# Patient Record
Sex: Male | Born: 1945
Health system: Southern US, Community
[De-identification: ages and names within clinical notes are randomized; demographics above are authoritative.]

## PROBLEM LIST (undated history)

## (undated) DIAGNOSIS — Z872 Personal history of diseases of the skin and subcutaneous tissue: Secondary | ICD-10-CM

## (undated) DIAGNOSIS — I10 Essential (primary) hypertension: Secondary | ICD-10-CM

## (undated) DIAGNOSIS — E785 Hyperlipidemia, unspecified: Secondary | ICD-10-CM

## (undated) DIAGNOSIS — C61 Malignant neoplasm of prostate: Secondary | ICD-10-CM

## (undated) DIAGNOSIS — I5032 Chronic diastolic (congestive) heart failure: Secondary | ICD-10-CM

## (undated) DIAGNOSIS — F329 Major depressive disorder, single episode, unspecified: Secondary | ICD-10-CM

## (undated) DIAGNOSIS — H269 Unspecified cataract: Secondary | ICD-10-CM

## (undated) DIAGNOSIS — I509 Heart failure, unspecified: Secondary | ICD-10-CM

## (undated) DIAGNOSIS — R1310 Dysphagia, unspecified: Secondary | ICD-10-CM

## (undated) DIAGNOSIS — E039 Hypothyroidism, unspecified: Secondary | ICD-10-CM

## (undated) DIAGNOSIS — F1011 Alcohol abuse, in remission: Secondary | ICD-10-CM

## (undated) DIAGNOSIS — F32A Depression, unspecified: Secondary | ICD-10-CM

## (undated) DIAGNOSIS — K635 Polyp of colon: Secondary | ICD-10-CM

## (undated) DIAGNOSIS — K469 Unspecified abdominal hernia without obstruction or gangrene: Secondary | ICD-10-CM

## (undated) DIAGNOSIS — R269 Unspecified abnormalities of gait and mobility: Secondary | ICD-10-CM

## (undated) DIAGNOSIS — E079 Disorder of thyroid, unspecified: Secondary | ICD-10-CM

## (undated) DIAGNOSIS — D649 Anemia, unspecified: Secondary | ICD-10-CM

## (undated) DIAGNOSIS — A029 Salmonella infection, unspecified: Secondary | ICD-10-CM

## (undated) DIAGNOSIS — K219 Gastro-esophageal reflux disease without esophagitis: Secondary | ICD-10-CM

## (undated) DIAGNOSIS — M1990 Unspecified osteoarthritis, unspecified site: Secondary | ICD-10-CM

## (undated) DIAGNOSIS — I350 Nonrheumatic aortic (valve) stenosis: Secondary | ICD-10-CM

## (undated) HISTORY — PX: OTHER SURGICAL HISTORY: SHX169

## (undated) HISTORY — PX: HERNIA REPAIR: SHX51

## (undated) HISTORY — PX: ROOT CANAL: SHX2363

## (undated) HISTORY — DX: Depression, unspecified: F32.A

## (undated) HISTORY — DX: Unspecified abnormalities of gait and mobility: R26.9

## (undated) HISTORY — DX: Dysphagia, unspecified: R13.10

## (undated) HISTORY — DX: Malignant neoplasm of prostate: C61

## (undated) HISTORY — DX: Chronic diastolic (congestive) heart failure: I50.32

## (undated) HISTORY — DX: Major depressive disorder, single episode, unspecified: F32.9

## (undated) HISTORY — DX: Hyperlipidemia, unspecified: E78.5

## (undated) HISTORY — DX: Nonrheumatic aortic (valve) stenosis: I35.0

## (undated) HISTORY — DX: Unspecified osteoarthritis, unspecified site: M19.90

## (undated) HISTORY — DX: Polyp of colon: K63.5

## (undated) HISTORY — DX: Alcohol abuse, in remission: F10.11

## (undated) HISTORY — DX: Gastro-esophageal reflux disease without esophagitis: K21.9

## (undated) HISTORY — DX: Personal history of diseases of the skin and subcutaneous tissue: Z87.2

## (undated) HISTORY — DX: Essential (primary) hypertension: I10

## (undated) HISTORY — PX: PROSTATE BIOPSY: SHX241

## (undated) HISTORY — DX: Unspecified cataract: H26.9

## (undated) HISTORY — DX: Unspecified abdominal hernia without obstruction or gangrene: K46.9

---

## 1998-08-12 ENCOUNTER — Ambulatory Visit (HOSPITAL_BASED_OUTPATIENT_CLINIC_OR_DEPARTMENT_OTHER): Admission: RE | Admit: 1998-08-12 | Discharge: 1998-08-12 | Payer: Self-pay | Admitting: Orthopedic Surgery

## 2004-02-16 ENCOUNTER — Emergency Department (HOSPITAL_COMMUNITY): Admission: EM | Admit: 2004-02-16 | Discharge: 2004-02-16 | Payer: Self-pay | Admitting: Emergency Medicine

## 2007-11-08 ENCOUNTER — Emergency Department (HOSPITAL_COMMUNITY): Admission: EM | Admit: 2007-11-08 | Discharge: 2007-11-08 | Payer: Self-pay | Admitting: Emergency Medicine

## 2010-06-19 ENCOUNTER — Inpatient Hospital Stay (HOSPITAL_COMMUNITY): Admission: EM | Admit: 2010-06-19 | Discharge: 2010-06-25 | Payer: Self-pay | Source: Home / Self Care

## 2010-06-19 ENCOUNTER — Encounter (INDEPENDENT_AMBULATORY_CARE_PROVIDER_SITE_OTHER): Payer: Self-pay | Admitting: Internal Medicine

## 2010-06-21 ENCOUNTER — Encounter: Payer: Self-pay | Admitting: Cardiology

## 2010-06-22 ENCOUNTER — Encounter (INDEPENDENT_AMBULATORY_CARE_PROVIDER_SITE_OTHER): Payer: Self-pay | Admitting: Internal Medicine

## 2010-07-12 HISTORY — PX: APPENDECTOMY: SHX54

## 2010-07-22 DIAGNOSIS — I10 Essential (primary) hypertension: Secondary | ICD-10-CM | POA: Insufficient documentation

## 2010-07-22 DIAGNOSIS — I872 Venous insufficiency (chronic) (peripheral): Secondary | ICD-10-CM | POA: Insufficient documentation

## 2010-07-22 DIAGNOSIS — E785 Hyperlipidemia, unspecified: Secondary | ICD-10-CM | POA: Insufficient documentation

## 2010-07-24 ENCOUNTER — Encounter: Payer: Self-pay | Admitting: Cardiology

## 2010-07-24 ENCOUNTER — Ambulatory Visit
Admission: RE | Admit: 2010-07-24 | Discharge: 2010-07-24 | Payer: Self-pay | Source: Home / Self Care | Attending: Cardiology | Admitting: Cardiology

## 2010-07-24 DIAGNOSIS — I5032 Chronic diastolic (congestive) heart failure: Secondary | ICD-10-CM | POA: Insufficient documentation

## 2010-07-24 DIAGNOSIS — F1010 Alcohol abuse, uncomplicated: Secondary | ICD-10-CM | POA: Insufficient documentation

## 2010-07-24 DIAGNOSIS — Z789 Other specified health status: Secondary | ICD-10-CM | POA: Insufficient documentation

## 2010-08-13 ENCOUNTER — Telehealth: Payer: Self-pay | Admitting: Cardiology

## 2010-08-13 NOTE — Assessment & Plan Note (Signed)
Summary: np6/self ref/weakness,low blood pressure/uhc/mj   Visit Type:  Initial Consult Primary Provider:  Pearson Grippe, MD  CC:  Diastolic .  History of Present Illness: The patient presents for evaluation of diastolic heart failure. He was hospitalized in December. He had lower extremity swelling. He also had essentially a failure to thrive and excessive alcohol use complicated by major depressive disorder. During his hospitalization he was noted to have a BNP greater than 1000. Chest CT done for other reasons demonstrated some mild pulmonary edema. He was treated with diuretics. He was hypertensive and managed for this. Echocardiogram suggested some diastolic dysfunction but no significant valvular abnormalities.  There was a trivial pericardial effusion. Stress perfusion imaging demonstrated no ischemia or infarct.  Since going home he is wife has been caring for him. They have been living apartment. She had been living with her parents taking care of them. He had been failing to thrive left to his own care. He has now moved in with her and her parents. She watches his volume and gives him diuretics only if he has increased swelling with some dyspnea. She notes actually that his blood pressure has been running quite low and currently all antihypertensives have been held. He's not having any PND or orthopnea. He has not been having any palpitations, presyncope or syncope. He's not having any chest pressure, neck or arm discomfort. Of note he is avoiding all alcohol and has a low-salt diet.  He is extremely sedentary for reasons that are not clear to me. He does have chronic dyspnea with exertion. He apparently has some gait disturbance and diffuse muscle weakness with an apparent diagnosis of spinal stenosis.   Current Medications (verified): 1)  Lisinopril 20 Mg Tabs (Lisinopril) .... Hold 2)  Lexapro 10 Mg Tabs (Escitalopram Oxalate) .... 1/2 By Mouth At Bedtime 3)  Omeprazole 40 Mg Cpdr  (Omeprazole) .Marland Kitchen.. 1 By Mouth Daily 4)  Metoprolol Tartrate 50 Mg Tabs (Metoprolol Tartrate) .... Hold 5)  Spironolactone 25 Mg Tabs (Spironolactone) .... Hold 6)  Furosemide 40 Mg Tabs (Furosemide) .... Hold  Allergies (verified): No Known Drug Allergies  Past History:  Past Medical History: Diastolic heart failure, Alcohol dependence Depressive disorder Hypertension Hyperlipidemia     Past Surgical History: None     Family History:  Father died from complications of a heart attack at age 29.  His father was an alcoholic.     Social History: The patient denies any history of tobacco use.  Drinks about 12 cans of beer per day for unspecified duration of time.  He is currently retired and lives on [address].   Review of Systems       As stated in the HPI and negative for all other systems.   Vital Signs:  Patient profile:   65 year old male Height:      69 inches Weight:      175 pounds BMI:     25.94 Pulse rate:   70 / minute Resp:     16 per minute BP sitting:   130 / 81  (left arm)  Vitals Entered By: Marrion Coy, CNA (July 24, 2010 2:43 PM)  Physical Exam  General:  unkept.   Head:  normocephalic and atraumatic Eyes:  PERRLA/EOM intact; conjunctiva and lids normal. Mouth:  Teeth, gums and palate normal. Oral mucosa normal. Neck:  Neck supple, no JVD. No masses, thyromegaly or abnormal cervical nodes. Chest Wall:  no deformities or breast masses noted Lungs:  Clear  bilaterally to auscultation and percussion. Abdomen:  Bowel sounds positive; abdomen soft and non-tender without masses, organomegaly, or hernias noted. No hepatosplenomegaly, large umbilical hernia Msk:  Diffuse muscle wasting Extremities:  Mild bilateral lower extremity edema with his left leg wrapped Neurologic:  Alert and oriented x 3. Skin:  Intact without lesions or rashes. Cervical Nodes:  no significant adenopathy Axillary Nodes:  no significant adenopathy Inguinal Nodes:  no  significant adenopathy Psych:  Normal affect.   Detailed Cardiovascular Exam  Neck    Carotids: Carotids full and equal bilaterally without bruits.      Neck Veins: Normal, no JVD.    Heart    Inspection: no deformities or lifts noted.      Palpation: normal PMI with no thrills palpable.      Auscultation: regular rate and rhythm, S1, S2 without murmurs, rubs, gallops, or clicks.    Vascular    Abdominal Aorta: no palpable masses, pulsations, or audible bruits.      Femoral Pulses: normal femoral pulses bilaterally.      Pedal Pulses: normal pedal pulses bilaterally.      Radial Pulses: normal radial pulses bilaterally.      Peripheral Circulation: no clubbing, cyanosis, with normal capillary refill.     EKG  Procedure date:  06/21/2010  Findings:      Sinus rhythm, rate 67, axis within normal limits, intervals within normal limits, no acute ST-T wave changes  Impression & Recommendations:  Problem # 1:  ACUTE DIASTOLIC HEART FAILURE (ICD-428.31) He seemed to be euvolemic at this point and his wife is taking extraordinary care of him. We discussed this diagnosis at length and I think they can continue with salt and fluid restriction and p.r.n. Lasix dosing using a lower dose 20 mg.  Problem # 2:  HYPERTENSION (ICD-401.9) I would agree with holding all scheduled antihypertensives as he is having some problems with hypotension. His wife will keep a blood pressure diary and we can reinstitute probably lisinopril at a low dose if his blood pressure starts creeping up.  Problem # 3:  ALCOHOL ABUSE (ICD-305.00) It sounds like this was very mild contributing to his overall failure to thrive. I reemphasized the need for continued complete abstinence.  Patient Instructions: 1)  Your physician recommends that you schedule a follow-up appointment as needed 2)  Your physician recommends that you continue on your current medications as directed. Please refer to the Current Medication  list given to you today.

## 2010-08-19 NOTE — Progress Notes (Signed)
Summary: pt needs surg clearence for hernia surgery  Phone Note Call from Patient   Caller: Patient Summary of Call: pt needs ok to have hernia surgery/needs to ok with [facility] [phone] pls call asap pt ready to schedule asap Initial call taken by: Glynda Jaeger,  August 13, 2010 4:47 PM  Follow-up for Phone Call        CINDY FROM [facility] OFFICE CALLED AND THEY STILL HAVE NOT GOTTEN THE CLEARENCE PLEASE SEND ASAP FAX# [phone] Omer Jack  August 14, 2010 9:22 AM   Additional Follow-up for Phone Call Additional follow up Details #1::        Patient is at acceptable risk for the planned surgery.  He has diastolic heart failure and he will need to have is volume watched closely. Additional Follow-up by: Rollene Rotunda, MD, [facility],  August 14, 2010 10:23 AM    Additional Follow-up for Phone Call Additional follow up Details #2::    information faxed to the above number Follow-up by: Charolotte Capuchin, RN,  August 14, 2010 10:27 AM

## 2010-08-19 NOTE — Letter (Signed)
Summary: Patient's Vitals  Patient's Vitals   Imported By: Marylou Mccoy 08/13/2010 13:51:33  _____________________________________________________________________  External Attachment:    Type:   Image     Comment:   External Document

## 2010-09-21 LAB — BASIC METABOLIC PANEL WITH GFR
BUN: 14 mg/dL (ref 6–23)
CO2: 24 meq/L (ref 19–32)
Calcium: 9.2 mg/dL (ref 8.4–10.5)
Chloride: 101 meq/L (ref 96–112)
Creatinine, Ser: 0.94 mg/dL (ref 0.4–1.5)
GFR calc non Af Amer: >60 mL/min
Glucose, Bld: 104 mg/dL — ABNORMAL HIGH (ref 70–99)
Potassium: 4 meq/L (ref 3.5–5.1)
Sodium: 135 meq/L (ref 135–145)

## 2010-09-22 LAB — BASIC METABOLIC PANEL
BUN: 10 mg/dL (ref 6–23)
BUN: 8 mg/dL (ref 6–23)
CO2: 24 mEq/L (ref 19–32)
CO2: 27 mEq/L (ref 19–32)
CO2: 28 mEq/L (ref 19–32)
CO2: 28 mEq/L (ref 19–32)
Calcium: 8.6 mg/dL (ref 8.4–10.5)
Calcium: 8.6 mg/dL (ref 8.4–10.5)
Calcium: 8.7 mg/dL (ref 8.4–10.5)
Calcium: 9.4 mg/dL (ref 8.4–10.5)
Chloride: 96 mEq/L (ref 96–112)
Chloride: 99 mEq/L (ref 96–112)
Creatinine, Ser: 0.9 mg/dL (ref 0.4–1.5)
Creatinine, Ser: 1.01 mg/dL (ref 0.4–1.5)
Creatinine, Ser: 1.01 mg/dL (ref 0.4–1.5)
Creatinine, Ser: 1.18 mg/dL (ref 0.4–1.5)
GFR calc Af Amer: >60 mL/min (ref >60)
GFR calc non Af Amer: >60 mL/min (ref >60)
GFR calc non Af Amer: >60 mL/min (ref >60)
Glucose, Bld: 101 mg/dL — ABNORMAL HIGH (ref 70–99)
Glucose, Bld: 85 mg/dL (ref 70–99)
Glucose, Bld: 88 mg/dL (ref 70–99)
Glucose, Bld: 93 mg/dL (ref 70–99)
Glucose, Bld: 95 mg/dL (ref 70–99)
Potassium: 3.5 mEq/L (ref 3.5–5.1)
Sodium: 131 mEq/L — ABNORMAL LOW (ref 135–145)
Sodium: 136 mEq/L (ref 135–145)

## 2010-09-22 LAB — DIFFERENTIAL
Basophils Relative: 2 % — ABNORMAL HIGH (ref 0–1)
Basophils Relative: 3 % — ABNORMAL HIGH (ref 0–1)
Eosinophils Absolute: 1.1 10*3/uL — ABNORMAL HIGH (ref 0.0–0.7)
Eosinophils Relative: 12 % — ABNORMAL HIGH (ref 0–5)
Lymphs Abs: 2.1 10*3/uL (ref 0.7–4.0)
Monocytes Absolute: 0.8 10*3/uL (ref 0.1–1.0)
Monocytes Relative: 10 % (ref 3–12)
Monocytes Relative: 9 % (ref 3–12)
Neutro Abs: 3.3 10*3/uL (ref 1.7–7.7)
Neutrophils Relative %: 45 % (ref 43–77)
Neutrophils Relative %: 53 % (ref 43–77)

## 2010-09-22 LAB — LIPID PANEL
Cholesterol: 153 mg/dL (ref 0–200)
LDL Cholesterol: 107 mg/dL — ABNORMAL HIGH (ref 0–99)
Total CHOL/HDL Ratio: 6.1 RATIO
Triglycerides: 107 mg/dL (ref <150)
VLDL: 21 mg/dL (ref 0–40)

## 2010-09-22 LAB — MAGNESIUM: Magnesium: 2.3 mg/dL (ref 1.5–2.5)

## 2010-09-22 LAB — COMPREHENSIVE METABOLIC PANEL
BUN: 9 mg/dL (ref 6–23)
CO2: 27 mEq/L (ref 19–32)
Calcium: 8.8 mg/dL (ref 8.4–10.5)
Creatinine, Ser: 1.01 mg/dL (ref 0.4–1.5)
GFR calc non Af Amer: >60 mL/min (ref >60)
Glucose, Bld: 93 mg/dL (ref 70–99)
Total Bilirubin: 1 mg/dL (ref 0.3–1.2)

## 2010-09-22 LAB — CBC
HCT: 41.6 % (ref 39.0–52.0)
HCT: 43.1 % (ref 39.0–52.0)
Hemoglobin: 13.8 g/dL (ref 13.0–17.0)
Hemoglobin: 14.3 g/dL (ref 13.0–17.0)
Hemoglobin: 15.4 g/dL (ref 13.0–17.0)
MCH: 35 pg — ABNORMAL HIGH (ref 26.0–34.0)
MCH: 35.1 pg — ABNORMAL HIGH (ref 26.0–34.0)
MCH: 35.2 pg — ABNORMAL HIGH (ref 26.0–34.0)
MCHC: 34.6 g/dL (ref 30.0–36.0)
MCHC: 34.8 g/dL (ref 30.0–36.0)
MCHC: 35.7 g/dL (ref 30.0–36.0)
MCV: 101 fL — ABNORMAL HIGH (ref 78.0–100.0)
MCV: 98.4 fL (ref 78.0–100.0)
Platelets: 220 10*3/uL (ref 150–400)
Platelets: 234 10*3/uL (ref 150–400)
RBC: 3.97 MIL/uL — ABNORMAL LOW (ref 4.22–5.81)
RDW: 13.3 % (ref 11.5–15.5)
RDW: 13.4 % (ref 11.5–15.5)
WBC: 11.8 10*3/uL — ABNORMAL HIGH (ref 4.0–10.5)

## 2010-09-22 LAB — HEPATITIS B CORE ANTIBODY, TOTAL: Hep B Core Total Ab: NEGATIVE

## 2010-09-22 LAB — BRAIN NATRIURETIC PEPTIDE: Pro B Natriuretic peptide (BNP): 895 pg/mL — ABNORMAL HIGH (ref 0.0–100.0)

## 2010-09-22 LAB — HEPATITIS B SURFACE ANTIBODY,QUALITATIVE: Hep B S Ab: NEGATIVE

## 2010-09-22 LAB — HEPATITIS B SURFACE ANTIGEN: Hepatitis B Surface Ag: NEGATIVE

## 2010-09-23 ENCOUNTER — Ambulatory Visit (HOSPITAL_COMMUNITY)
Admission: RE | Admit: 2010-09-23 | Discharge: 2010-09-23 | Disposition: A | Discharge status: Home / Self Care | Payer: Medicare Other | Source: Ambulatory Visit | Attending: General Surgery | Admitting: General Surgery

## 2010-09-23 ENCOUNTER — Encounter (HOSPITAL_COMMUNITY)
Admission: RE | Admit: 2010-09-23 | Discharge: 2010-09-23 | Disposition: A | Discharge status: Home / Self Care | Payer: Medicare Other | Source: Ambulatory Visit | Attending: General Surgery | Admitting: General Surgery

## 2010-09-23 ENCOUNTER — Other Ambulatory Visit (HOSPITAL_COMMUNITY): Payer: Self-pay | Admitting: General Surgery

## 2010-09-23 DIAGNOSIS — K469 Unspecified abdominal hernia without obstruction or gangrene: Secondary | ICD-10-CM

## 2010-09-23 DIAGNOSIS — Z01818 Encounter for other preprocedural examination: Secondary | ICD-10-CM | POA: Insufficient documentation

## 2010-09-23 DIAGNOSIS — Z01812 Encounter for preprocedural laboratory examination: Secondary | ICD-10-CM | POA: Insufficient documentation

## 2010-09-23 LAB — DIFFERENTIAL
Eosinophils Absolute: 0.2 10*3/uL (ref 0.0–0.7)
Lymphs Abs: 2.4 10*3/uL (ref 0.7–4.0)
Monocytes Absolute: 0.8 10*3/uL (ref 0.1–1.0)
Monocytes Relative: 9 % (ref 3–12)
Neutro Abs: 6.1 10*3/uL (ref 1.7–7.7)
Neutrophils Relative %: 63 % (ref 43–77)

## 2010-09-23 LAB — COMPREHENSIVE METABOLIC PANEL
ALT: 17 U/L (ref 0–53)
AST: 25 U/L (ref 0–37)
Albumin: 4.5 g/dL (ref 3.5–5.2)
Alkaline Phosphatase: 64 U/L (ref 39–117)
CO2: 29 mEq/L (ref 19–32)
Chloride: 99 mEq/L (ref 96–112)
GFR calc Af Amer: >60 mL/min (ref >60)
Potassium: 4.2 mEq/L (ref 3.5–5.1)
Sodium: 133 mEq/L — ABNORMAL LOW (ref 135–145)
Total Bilirubin: 1.3 mg/dL — ABNORMAL HIGH (ref 0.3–1.2)

## 2010-09-23 LAB — CBC
Hemoglobin: 14.1 g/dL (ref 13.0–17.0)
MCH: 34.6 pg — ABNORMAL HIGH (ref 26.0–34.0)
MCHC: 35.3 g/dL (ref 30.0–36.0)
MCV: 98 fL (ref 78.0–100.0)
RBC: 4.07 MIL/uL — ABNORMAL LOW (ref 4.22–5.81)

## 2010-09-23 LAB — URINALYSIS, ROUTINE W REFLEX MICROSCOPIC
Bilirubin Urine: NEGATIVE
Glucose, UA: NEGATIVE mg/dL
Ketones, ur: NEGATIVE mg/dL
Protein, ur: NEGATIVE mg/dL
pH: 7.5 (ref 5.0–8.0)

## 2010-09-23 LAB — APTT: aPTT: 32 seconds (ref 24–37)

## 2010-09-25 LAB — NO BLOOD PRODUCTS

## 2010-09-29 ENCOUNTER — Observation Stay (HOSPITAL_COMMUNITY)
Admission: RE | Admit: 2010-09-29 | Discharge: 2010-09-30 | Disposition: A | Discharge status: Home / Self Care | Payer: Medicare Other | Source: Ambulatory Visit | Attending: General Surgery | Admitting: General Surgery

## 2010-09-29 ENCOUNTER — Other Ambulatory Visit: Payer: Self-pay | Admitting: General Surgery

## 2010-09-29 DIAGNOSIS — K4090 Unilateral inguinal hernia, without obstruction or gangrene, not specified as recurrent: Secondary | ICD-10-CM | POA: Insufficient documentation

## 2010-09-29 DIAGNOSIS — F329 Major depressive disorder, single episode, unspecified: Secondary | ICD-10-CM | POA: Insufficient documentation

## 2010-09-29 DIAGNOSIS — I10 Essential (primary) hypertension: Secondary | ICD-10-CM | POA: Insufficient documentation

## 2010-09-29 DIAGNOSIS — K429 Umbilical hernia without obstruction or gangrene: Principal | ICD-10-CM | POA: Insufficient documentation

## 2010-09-29 DIAGNOSIS — F3289 Other specified depressive episodes: Secondary | ICD-10-CM | POA: Insufficient documentation

## 2010-09-29 DIAGNOSIS — I5030 Unspecified diastolic (congestive) heart failure: Secondary | ICD-10-CM | POA: Insufficient documentation

## 2010-09-29 DIAGNOSIS — F1020 Alcohol dependence, uncomplicated: Secondary | ICD-10-CM | POA: Insufficient documentation

## 2010-09-29 DIAGNOSIS — I509 Heart failure, unspecified: Secondary | ICD-10-CM | POA: Insufficient documentation

## 2010-09-29 DIAGNOSIS — Z01818 Encounter for other preprocedural examination: Secondary | ICD-10-CM | POA: Insufficient documentation

## 2010-09-29 DIAGNOSIS — Z01812 Encounter for preprocedural laboratory examination: Secondary | ICD-10-CM | POA: Insufficient documentation

## 2010-09-30 NOTE — Op Note (Signed)
NAME:  Scott Newman, Scott Newman              ACCOUNT NO.:  [id number]  MEDICAL RECORD NO.:  [id number]           PATIENT TYPE:  O  LOCATION:  5151                         FACILITY:  [facility]  PHYSICIAN:  Angelia Mould. Derrell Lolling, M.D.DATE OF BIRTH:  17-Aug-1945  DATE OF PROCEDURE:  09/29/2010 DATE OF DISCHARGE:                              OPERATIVE REPORT   PREOPERATIVE DIAGNOSES: 1. Umbilical hernia. 2. Right inguinal hernia.  POSTOPERATIVE DIAGNOSES: 1. Umbilical hernia. 2. Right inguinal hernia.  OPERATION PERFORMED: 1. Repair umbilical hernia with mesh. 2. Debridement of skin and subcutaneous tissue of periumbilical area. 3. Repair right inguinal hernia with mesh Armanda Heritage repair).  SURGEON:  Angelia Mould. Derrell Lolling, MD  OPERATIVE INDICATIONS:  This is a 65 year old Caucasian man with diastolic heart failure, history of alcohol dependence, depressive disorder, hypertension.  He has had an umbilical hernia and right inguinal hernia for many years and they have been getting larger.  He now has some discomfort but no severe pain and no history of incarceration.  He wants to have these repaired.  On exam, he has a very large umbilical hernia with a hernia sac about 8 cm in size and a defect about 4 cm in size, this is reducible.  The skin has been stretched out a great deal.  He also has a large right inguinal hernia that partially extends into the scrotum and it is reducible when he is supine.  He was brought to the operating room electively.  OPERATIVE TECHNIQUE:  Following the induction of general endotracheal anesthesia, the patient's abdomen and groins and genitalia were prepped and draped in a sterile fashion.  Intravenous antibiotics were given. Surgical time-out was held, identifying correct patient, correct procedure, and correct sites.  A 0.25% Marcaine with epinephrine was used as a local infiltration anesthetic.  We started with the umbilical hernia.  I made a transverse  elliptical incision and completely excised the excessively redundant skin and subcutaneous tissue.  I then debrided the hernia sac.  I then could see the edges of the fascial defect, which were about 4 cm in size or less. We had clear visualization of the abdominal cavity.  The small bowel looked normal.  There was no ascites.  I felt all around underneath the fascia and there were no other defects.  I brought a 6.5 cm diameter circular piece of PROCEED ventral patch, which is a composite mesh.  The tags were cut off.  I positioned the mesh so that the visceral side was down.  I sutured this in place with several interrupted mattress sutures of #1 Novofil, going back about 3 cm in all direction.  Once this all these sutures were placed, I then rolled up the mesh and inserted in the abdomen and then pulled all the sutures up and the mesh deployed quite nicely.  I tied all the sutures. I felt around underneath it did not appear to be any major defects and certainly no intestine or omentum caught in the mesh.  I then closed the fascia transversely with about 8 interrupted sutures of #1 Novofil.  The wound was irrigated with saline.  Subcutaneous tissue was  closed with interrupted sutures of 3-0 Vicryl.  The skin was closed with a running subcuticular suture of 4-0 Monocryl and Dermabond.  I then went down to repair the right inguinal hernia and made a slightly oblique transverse incision in the right inguinal area.  Dissection was carried down through the subcutaneous tissue exposing the aponeurosis of the external oblique.  The external oblique was incised in the direction of its fibers and opened up the external inguinal ring.  The external oblique was dissected away from the underlying tissues and self- retaining retractors were placed.  I very carefully cleaned off the inguinal ligament inferiorly.  I encircled the cord structures with a Penrose drain.  I dissected a very large complex  sliding hernia away from the cord structures.  This actually seemed to fill the direct and the indirect spaces as one hernia.  Because this was a large sliding hernia, I simply reduced it and oversewed and imbricated the tissues on top with a running suture of 2-0 Vicryl.  This held the hernia reduced during the repair.  I repaired the hernia with a 3 inch x 6 inch piece of UltraPro mesh.  This was trimmed at the corners.  The mesh was sutured in place with running sutures of 2-0 Prolene and interrupted mattress sutures of 2-0 Prolene.  Running suture of 2-0 Prolene was placed along the inguinal ligament, hold the mesh in place starting at the pubic tubercle and going about 2 inches lateral to the internal ring.  This was then tied down.  Medially and superiorly and superior medially, several interrupted mattress sutures of 2-0 Prolene were placed.  The mesh was incised laterally so as to wrap around the cord structures of the internal ring.  The tails of the mesh was overlapped laterally and several other sutures were placed.  Hold the mesh in place and the hold the mesh to itself.  This provided a very secure repair of both medial and lateral to the internal ring, but allowed a fingertip opening for the cord structures.  There was no bleeding.  The wound was irrigated with saline.  The external oblique was closed with running suture of 2-0 Vicryl.  The Scarpa fascia was closed with 3-0 Vicryl sutures and skin closed with running subcuticular suture of 4-0 Monocryl and Dermabond.  Ice packs were placed and the patient was taken to the recovery room in stable condition.  Estimated blood loss was about 25 mL or less.  Complications none.  Sponge, needles and instruments counts were correct.     Angelia Mould. Derrell Lolling, M.D.     HMI/MEDQ  D:  09/29/2010  T:  09/30/2010  Job:  [id number]  cc:   Massie Maroon, MD Rollene Rotunda, MD, [facility]  Electronically Signed by Claud Kelp M.D. on  09/30/2010 06:20:29 AM

## 2010-10-09 NOTE — Discharge Summary (Signed)
  NAME:  Scott Newman, Scott Newman              ACCOUNT NO.:  [id number]  MEDICAL RECORD NO.:  [id number]           PATIENT TYPE:  O  LOCATION:  5151                         FACILITY:  [facility]  PHYSICIAN:  Angelia Mould. Derrell Lolling, M.D.DATE OF BIRTH:  [birth year]  DATE OF ADMISSION:  09/29/2010 DATE OF DISCHARGE:  09/30/2010                              DISCHARGE SUMMARY   FINAL DIAGNOSES: 1. Umbilical hernia. 2. Right inguinal hernia. 3. Diastolic heart failure, stable. 4. History of alcohol dependence, no alcohol intake since June 19, 2010. 5. Depressive disorder, not otherwise specified. 6. Hypertension. 7. Past history of lower extremity cellulitis with venous     insufficiency. 8. History of abnormal liver function test with negative hepatitis     serology.  OPERATIONS PERFORMED: 1. Repair of umbilical hernia with mesh. 2. Repair of right inguinal hernia with mesh.  HISTORY:  This is a 65 year old Caucasian man with multiple medical problems as listed above.  He has had an umbilical hernia and right inguinal hernia for many years they have been getting larger.  He now has some discomfort but no history of incarceration of these hernias. On exam, he has a very large umbilical hernia with the hernia sac about 8 cm in size at least and a defect about 4 cm in size.  This is reducible.  The skin has been stretched out a great deal.  He also has a large right inguinal hernia that partially extended into the scrotum and is reducible with supine.  He was brought to operating room electively following medical evaluation by Dr. Selena Batten and Dr. Antoine Poche.  HOSPITAL COURSE:  On the day of admission, the patient was brought to operating room and under general anesthesia we repaired the umbilical hernia and the right inguinal hernia with mesh.  Because of the excessive redundant skin around the umbilicus, we did simply resect that skin and closed the skin transversely.  The surgery was  uneventful. Because of the patient's medical problems and age, we elected to observe him overnight.  He had no problems following morning, he was tolerating a liquid diet, voiding well and was getting out of bed, all the wounds looked fine without any bleeding.  He was discharged home.  DISCHARGE MEDICATIONS: 1. Vicodin for pain. 2. Vitamin B drops. 3. Vitamin C 1 tablet daily. 4. Osteo Bi-Flex daily. 5. Lexapro 10 mg daily.  He was given instruction in diet and activities and a followup appointment with me in about 3 weeks.     Angelia Mould. Derrell Lolling, M.D.     HMI/MEDQ  D:  10/08/2010  T:  10/09/2010  Job:  [id number]  cc:   Massie Maroon, MD Rollene Rotunda, MD, [facility]  Electronically Signed by Claud Kelp M.D. on 10/09/2010 08:53:27 AM

## 2010-11-18 ENCOUNTER — Encounter (INDEPENDENT_AMBULATORY_CARE_PROVIDER_SITE_OTHER): Payer: Self-pay | Admitting: General Surgery

## 2011-01-18 ENCOUNTER — Other Ambulatory Visit: Payer: Self-pay | Admitting: Gastroenterology

## 2011-02-15 ENCOUNTER — Encounter (INDEPENDENT_AMBULATORY_CARE_PROVIDER_SITE_OTHER): Payer: Self-pay | Admitting: General Surgery

## 2011-02-15 ENCOUNTER — Ambulatory Visit (INDEPENDENT_AMBULATORY_CARE_PROVIDER_SITE_OTHER): Payer: Medicare Other | Admitting: General Surgery

## 2011-02-15 VITALS — BP 156/86 | HR 64 | Temp 96.6°F | Ht 69.0 in | Wt 200.4 lb

## 2011-02-15 DIAGNOSIS — D126 Benign neoplasm of colon, unspecified: Secondary | ICD-10-CM

## 2011-02-15 NOTE — Progress Notes (Signed)
Subjective:     Patient ID: Scott Newman, male   DOB: 1946/07/10, 65 y.o.   MRN: [id number]  HPI Scott Newman has just recovered from repair of a large incarcerated umbilical hernia with mesh and a right inguinal hernia repair with mesh. The surgery was  done back in March of 2012.  He is now sent back to me by Scott Newman for consideration of a right colectomy because of a large adenomatous polyp of the cecum. Scott Newman is his cardiologist and Scott Newman is his primary care physician.  The patient went for a screening colonoscopy  and this was done on January 18, 2011. 2 sessile polyps were found in the distal transverse colon which were removed. A sessile polyp was in  the descending colon which was removed. A large carpet-like polyp was found in the cecum which was 30 mm in size. Biopsies of all of the polyps showed tubular adenoma, no dysplasia. He was referred for consideration of resection of a large polyp in the cecum.  Other than constipation, he does not have any colorectal symptoms.  Significant comorbidities include stable diastolic heart failure, history of alcohol dependence, no alcohol since December 2011, depressive order, hypertension, or tree she lives in his insufficiency in the past, history of abnormal liver function test was negative serologyReview of Systems  Constitutional: Negative.   HENT: Negative.   Eyes: Negative.   Respiratory: Negative.   Cardiovascular: Negative.   Gastrointestinal: Negative.   Genitourinary: Negative.   Musculoskeletal: Negative.   Neurological: Negative.   Hematological: Negative.   Psychiatric/Behavioral: Negative.     Past Medical History  Diagnosis Date  . Cellulitis   . Arthritis   . Hernia     umbilical and inguinal  . Heart disease     heart weakness  . Colon polyp    Current Outpatient Prescriptions  Medication Sig Dispense Refill  . aspirin 325 MG tablet Take 325 mg by mouth as needed.        Marland Kitchen escitalopram  (LEXAPRO) 5 MG tablet Take 5 mg by mouth daily.        . Folic Acid 200 MCG TABS Take by mouth daily.        . Furosemide (LASIX PO) Take by mouth as needed.        Marland Kitchen SPIRONOLACTONE PO Take by mouth as needed.         No Known Allergies  Family History  Problem Relation Age of Onset  . Heart disease Father     History  Substance Use Topics  . Smoking status: Never Smoker   . Smokeless tobacco: Not on file  . Alcohol Use: No     RECOVERING ALCOHOLIC        Objective:   Physical Exam  Constitutional: He is oriented to person, place, and time. He appears well-developed and well-nourished. No distress.  HENT:  Head: Normocephalic and atraumatic.  Nose: Nose normal.  Mouth/Throat: No oropharyngeal exudate.  Eyes: Conjunctivae are normal. Right eye exhibits no discharge. Scleral icterus is present.  Neck: Normal range of motion. Neck supple. No JVD present. No tracheal deviation present. No thyromegaly present.  Cardiovascular: Normal rate, regular rhythm and intact distal pulses.   Murmur heard. Pulmonary/Chest: Breath sounds normal. No respiratory distress. He has no wheezes. He has no rales. He exhibits no tenderness.  Abdominal: Bowel sounds are normal. He exhibits no distension and no mass. There is no tenderness. There is no rebound and  no guarding.    Lymphadenopathy:    He has no cervical adenopathy.  Neurological: He is alert and oriented to person, place, and time. Coordination normal.  Skin: Skin is dry. No rash noted. No erythema. No pallor.  Psychiatric: He has a normal mood and affect. Judgment and thought content normal.       Assessment:     Tubular adenoma of cecum, 3 cm diameter.  Status post open repair incarcerated umbilical hernia with mesh.  Status post open repair right inguinal hernia with mesh.  Patient is a Scientist, product/process development.  History diastolic heart failure, stable  History alcohol dependence.  Depressive  disorder.  Hypertension.  History abnormal liver function test with negative hepatitis rales.  Past history lower extremity cellulitis and venous insufficiency.    Plan:     Scheduled for laparoscopic assisted right colectomy, possible open.  I've discussed indication and details of the surgery with the patient and his wife. Risk and complications have been outlined, including but not limited to bleeding, infection, hernia, mesh infection, conversion to open laparotomy, injury to adjacent organs such as the intestine major vascular structures or ureter with major reconstructive surgery, cardiac pulmonary and thromboembolic problems. Risk of colostomy is low but not impossible. He understands these issues well. At this time all his questions were answered. He is agreeable with this plan.  Discontinue aspirin 7 days preop.  We will ask for cardiac clearance with Scott Newman.

## 2011-02-15 NOTE — Patient Instructions (Signed)
You have a large polyp in your cecum, which is the first part of the colon on the right side. The biopsies do not show cancer, but this is premalignant and can become cancer in the future. We have discussed this in detail, and we are going to schedule you for a laparoscopic assisted right colectomy, although this may have to be converted to an open operation. You will undergo a bowel prep at home. You must discontinue your aspirin 7 days prior to surgery. We will ask Dr. Rollene Rotunda for cardiac evaluation and clearance  preop. We will do the surgery at [facility].

## 2011-02-19 ENCOUNTER — Telehealth: Payer: Self-pay | Admitting: Cardiology

## 2011-02-19 NOTE — Telephone Encounter (Signed)
Pam--i have tried to call [facility] to get pre op clearance for mr Such, but had to leave message with surgical scheduler to fax me referral form as i'm having trouble getting thru to them--i've asked them to fax attent Gala Padovano, so if you don't get one by next Monday, please call them again--pt needing bowel surgery--thanks nt

## 2011-02-19 NOTE — Telephone Encounter (Signed)
Arline Asp RN is calling for surgical clearance. Pt needs cardiac clearance for colon surgery.

## 2011-02-19 NOTE — Telephone Encounter (Signed)
Called [facility] and asked them to refax surgical clearance form to me so we may get mr Engebretson cleared for bowel surg.--nt

## 2011-02-23 NOTE — Telephone Encounter (Signed)
Per Dr Antoine Poche pt needs to be seen before surgical clearance can be given.  Send staff message to G. Davis to schedule the appointment for the patient.

## 2011-03-05 ENCOUNTER — Telehealth: Payer: Self-pay | Admitting: Cardiology

## 2011-03-05 NOTE — Telephone Encounter (Signed)
Pt needs sur clearance for colon surgery. Cindy need to talk to [facility] re pt meds.

## 2011-03-05 NOTE — Telephone Encounter (Signed)
Office aware pt has appt with Lorin Picket 03/18/11 for surgical clearance. Mylo Red RN

## 2011-03-18 ENCOUNTER — Ambulatory Visit (INDEPENDENT_AMBULATORY_CARE_PROVIDER_SITE_OTHER): Payer: Medicare Other | Admitting: Physician Assistant

## 2011-03-18 ENCOUNTER — Encounter: Payer: Self-pay | Admitting: Physician Assistant

## 2011-03-18 VITALS — BP 163/87 | HR 68 | Ht 69.0 in | Wt 207.0 lb

## 2011-03-18 DIAGNOSIS — I10 Essential (primary) hypertension: Secondary | ICD-10-CM

## 2011-03-18 DIAGNOSIS — K635 Polyp of colon: Secondary | ICD-10-CM | POA: Insufficient documentation

## 2011-03-18 DIAGNOSIS — I509 Heart failure, unspecified: Secondary | ICD-10-CM

## 2011-03-18 DIAGNOSIS — D126 Benign neoplasm of colon, unspecified: Secondary | ICD-10-CM

## 2011-03-18 DIAGNOSIS — I5032 Chronic diastolic (congestive) heart failure: Secondary | ICD-10-CM

## 2011-03-18 DIAGNOSIS — I519 Heart disease, unspecified: Secondary | ICD-10-CM

## 2011-03-18 DIAGNOSIS — Z01810 Encounter for preprocedural cardiovascular examination: Secondary | ICD-10-CM

## 2011-03-18 MED ORDER — LISINOPRIL 20 MG PO TABS: 20.0000 mg | ORAL_TABLET | Freq: Every day | ORAL | Status: DC

## 2011-03-18 NOTE — Assessment & Plan Note (Signed)
I will start him on Lisinopril 20 mg QD.  Repeat a BP check with the RN in one week along with a BMET.

## 2011-03-18 NOTE — Assessment & Plan Note (Signed)
Volume stable.  He has been advised to take Lasix prn weight gain.  Close attention will need to be paid to his fluid status in the peri-operative period to prevent volume overload.  Our service will be available in the peri-operative period as necessary.

## 2011-03-18 NOTE — Progress Notes (Signed)
History of Present Illness: Primary Cardiologist:  Dr. Rollene Rotunda  Scott Newman is a 65 y.o. male presents for surgical clearance.  He has a history of hypertension, hyperlipidemia and diastolic heart failure.  Last Myoview 12/11 (during admission for heart failure): No ischemia or scar, EF 63%.  Last echocardiogram 12/11: EF 55-60%, grade 1 diastolic dysfunction, mild aortic stenosis with a mean gradient of 6 mm of mercury, moderate mitral annular calcification, mild LAE, trivial pericardial effusion.  He was evaluated by Dr. Antoine Poche 1/12 after his hospitalization.  He was doing well in terms of fluid at that time.  Lasix was recommended at p.r.n. Dosing only.  He was off of most of his blood pressure medications due to hypotension.  His wife was paying close attention to his blood pressure for him.  Of note, he also has had a history of alcohol abuse.  He has a cecal adenoma that requires colon resection with Dr. Claud Kelp.  He has been referred for surgical clearance.  He is somewhat sedentary.  He reports a h/o muscle pain for years that limits his activity.  He walks with a single point cane.  He sometimes navigates steps.  He can do small projects around his home.  He denies any chest pain.  He denies significant dyspnea.  He denies orthopnea, PND or significant edema.  He has not had to take lasix for fluid gains.  He has been noncompliant with his diet and has gained 30 lbs since we last saw him.  He denies syncope or palpitations.    Past Medical History  Diagnosis Date  . History of cellulitis and abscess   . Arthritis   . Hernia     umbilical and inguinal  . Colon polyp   . Chronic diastolic heart failure     echocardiogram 12/11: EF 55-60%, grade 1 diastolic dysfunction, mild aortic stenosis with a mean gradient of 6 mm of mercury, moderate mitral annular calcification, mild LAE, trivial pericardial effusion;    Myoview 12/11 (during admission for heart failure): No  ischemia or scar, EF 63%.  . Aortic stenosis   . HTN (hypertension)   . Depression   . History of alcohol abuse     Current Outpatient Prescriptions  Medication Sig Dispense Refill  . aspirin 325 MG tablet Take 325 mg by mouth as needed.        Marland Kitchen escitalopram (LEXAPRO) 5 MG tablet Take 5 mg by mouth daily.        . Folic Acid 200 MCG TABS Take 2,000 mcg by mouth daily.       . furosemide (LASIX) 40 MG tablet Take 40 mg by mouth daily. Take as needed for weight gain of 3 pounds in one day or increased swelling and shortness of breath      . lisinopril (PRINIVIL,ZESTRIL) 20 MG tablet Take 1 tablet (20 mg total) by mouth daily.  30 tablet  6  . Multiple Vitamin (MULTI-VITAMIN PO) Take by mouth daily.          Allergies: No Known Allergies  Social Hx:  Non smoker  ROS:  Please see the history of present illness.  All other systems reviewed and negative.   Vital Signs: BP 163/87  Pulse 68  Ht 5\' 9"  (1.753 m)  Wt 207 lb (93.895 kg)  BMI 30.57 kg/m2 Repeat BP by me on left: 160/100  PHYSICAL EXAM: Well nourished, well developed, in no acute distress HEENT: normal Neck: no JVD Vascular: no  carotid bruits Cardiac:  normal S1, S2; RRR; 1-2/6 systolic murmur at RUSB Lungs:  clear to auscultation bilaterally, no wheezing, rhonchi or rales Abd: soft, nontender, no hepatomegaly Ext: trace bilateral edema Skin: warm and dry Neuro:  CNs 2-12 intact, no focal abnormalities noted Psych: flat affect  EKG:  Sinus rhythm, heart rate 66, normal axis, poor R-wave progression, nonspecific ST-T wave changes  ASSESSMENT AND PLAN:

## 2011-03-18 NOTE — Assessment & Plan Note (Addendum)
He needs colon resection.  He is currently not having any unstable cardiac conditions.  He had a low risk myoview in 12/11.  He has not had any recurrent chest pain or significant dyspnea.  He is probably able to achieve 4 METS.  His volume status is stable.  According to ACC/AHA guidelines, he requires no further cardiovascular workup prior to his noncardiac procedure.  He should be at acceptable risk.  He does need better BP control.  See below.

## 2011-03-18 NOTE — Patient Instructions (Addendum)
Weigh daily and call if:  Weight up 3 lbs in one day, increased swelling or increased dyspnea.   Your physician recommends that you schedule a follow-up appointment in: 3 months with Dr Antoine Poche    Your physician recommends that you schedule a follow-up appointment in: 1 week for BP check  Your physician has recommended you make the following change in your medication: START Lisinopril 20 mg daily  Your physician recommends that you return for lab work in: 1 week with BP check

## 2011-03-19 ENCOUNTER — Telehealth (INDEPENDENT_AMBULATORY_CARE_PROVIDER_SITE_OTHER): Payer: Self-pay

## 2011-03-19 NOTE — Telephone Encounter (Signed)
Cardiac clearance here via fax. Surgery orders and clearance to surgery schedulers 03-19-11

## 2011-03-29 ENCOUNTER — Other Ambulatory Visit (INDEPENDENT_AMBULATORY_CARE_PROVIDER_SITE_OTHER): Payer: Medicare Other | Admitting: *Deleted

## 2011-03-29 ENCOUNTER — Ambulatory Visit (INDEPENDENT_AMBULATORY_CARE_PROVIDER_SITE_OTHER): Payer: Medicare Other

## 2011-03-29 VITALS — BP 148/80 | HR 72 | Resp 18

## 2011-03-29 DIAGNOSIS — I5032 Chronic diastolic (congestive) heart failure: Secondary | ICD-10-CM

## 2011-03-29 DIAGNOSIS — I519 Heart disease, unspecified: Secondary | ICD-10-CM

## 2011-03-29 LAB — BASIC METABOLIC PANEL
BUN: 11 mg/dL (ref 6–23)
CO2: 25 mEq/L (ref 19–32)
Chloride: 99 mEq/L (ref 96–112)
Creatinine, Ser: 0.9 mg/dL (ref 0.4–1.5)
Glucose, Bld: 76 mg/dL (ref 70–99)

## 2011-03-29 NOTE — Progress Notes (Signed)
Per pt feeling fine.  Taking medications as RXed in the afternoon.  BP with his home machine 172/88  HR 69.

## 2011-04-06 LAB — DIFFERENTIAL
Basophils Absolute: 0
Basophils Relative: 1
Lymphocytes Relative: 34
Monocytes Absolute: 0.8
Monocytes Relative: 9
Neutro Abs: 4.2
Neutrophils Relative %: 49

## 2011-04-06 LAB — COMPREHENSIVE METABOLIC PANEL
Albumin: 4.3
BUN: 6
Creatinine, Ser: 0.67
Glucose, Bld: 112 — ABNORMAL HIGH
Total Bilirubin: 1.3 — ABNORMAL HIGH
Total Protein: 8.1

## 2011-04-06 LAB — CBC
HCT: 49.1
Hemoglobin: 15.9
MCV: 101.2 — ABNORMAL HIGH
Platelets: 222
RDW: 12.9

## 2011-04-30 ENCOUNTER — Encounter (HOSPITAL_COMMUNITY)
Admission: RE | Admit: 2011-04-30 | Discharge: 2011-04-30 | Disposition: A | Discharge status: Home / Self Care | Payer: Medicare Other | Source: Ambulatory Visit | Attending: General Surgery | Admitting: General Surgery

## 2011-04-30 LAB — CBC
HCT: 43.1 % (ref 39.0–52.0)
Hemoglobin: 15.9 g/dL (ref 13.0–17.0)
MCV: 94.1 fL (ref 78.0–100.0)
RBC: 4.58 MIL/uL (ref 4.22–5.81)
RDW: 13.9 % (ref 11.5–15.5)
WBC: 7.1 10*3/uL (ref 4.0–10.5)

## 2011-04-30 LAB — COMPREHENSIVE METABOLIC PANEL
ALT: 24 U/L (ref 0–53)
Alkaline Phosphatase: 65 U/L (ref 39–117)
BUN: 9 mg/dL (ref 6–23)
CO2: 27 mEq/L (ref 19–32)
Chloride: 94 mEq/L — ABNORMAL LOW (ref 96–112)
GFR calc Af Amer: >90 mL/min (ref >90)
Glucose, Bld: 118 mg/dL — ABNORMAL HIGH (ref 70–99)
Potassium: 4.5 mEq/L (ref 3.5–5.1)
Sodium: 132 mEq/L — ABNORMAL LOW (ref 135–145)
Total Bilirubin: 0.7 mg/dL (ref 0.3–1.2)
Total Protein: 7.9 g/dL (ref 6.0–8.3)

## 2011-04-30 LAB — SURGICAL PCR SCREEN: MRSA, PCR: NEGATIVE

## 2011-04-30 LAB — DIFFERENTIAL
Basophils Absolute: 0.1 10*3/uL (ref 0.0–0.1)
Eosinophils Relative: 3 % (ref 0–5)
Lymphocytes Relative: 33 % (ref 12–46)
Lymphs Abs: 2.4 10*3/uL (ref 0.7–4.0)
Neutro Abs: 3.8 10*3/uL (ref 1.7–7.7)

## 2011-04-30 LAB — URINALYSIS, ROUTINE W REFLEX MICROSCOPIC
Bilirubin Urine: NEGATIVE
Glucose, UA: NEGATIVE mg/dL
Hgb urine dipstick: NEGATIVE
Specific Gravity, Urine: 1.015 (ref 1.005–1.030)
pH: 6.5 (ref 5.0–8.0)

## 2011-05-03 ENCOUNTER — Telehealth (INDEPENDENT_AMBULATORY_CARE_PROVIDER_SITE_OTHER): Payer: Self-pay

## 2011-05-03 NOTE — Telephone Encounter (Signed)
Abn labs reviewed by Dr Lance Bosch to proceed with surgery. No action required. This was faxed back to preadmit.

## 2011-05-04 ENCOUNTER — Inpatient Hospital Stay (HOSPITAL_COMMUNITY)
Admission: RE | Admit: 2011-05-04 | Discharge: 2011-05-08 | DRG: 330 | Disposition: A | Discharge status: Home Health | Payer: Medicare Other | Source: Ambulatory Visit | Attending: General Surgery | Admitting: General Surgery

## 2011-05-04 ENCOUNTER — Other Ambulatory Visit (INDEPENDENT_AMBULATORY_CARE_PROVIDER_SITE_OTHER): Payer: Self-pay | Admitting: General Surgery

## 2011-05-04 DIAGNOSIS — Z7982 Long term (current) use of aspirin: Secondary | ICD-10-CM

## 2011-05-04 DIAGNOSIS — F329 Major depressive disorder, single episode, unspecified: Secondary | ICD-10-CM | POA: Diagnosis present

## 2011-05-04 DIAGNOSIS — Z01812 Encounter for preprocedural laboratory examination: Secondary | ICD-10-CM

## 2011-05-04 DIAGNOSIS — Z23 Encounter for immunization: Secondary | ICD-10-CM

## 2011-05-04 DIAGNOSIS — E669 Obesity, unspecified: Secondary | ICD-10-CM | POA: Diagnosis present

## 2011-05-04 DIAGNOSIS — D126 Benign neoplasm of colon, unspecified: Secondary | ICD-10-CM

## 2011-05-04 DIAGNOSIS — D378 Neoplasm of uncertain behavior of other specified digestive organs: Principal | ICD-10-CM | POA: Diagnosis present

## 2011-05-04 DIAGNOSIS — D371 Neoplasm of uncertain behavior of stomach: Principal | ICD-10-CM | POA: Diagnosis present

## 2011-05-04 DIAGNOSIS — K5730 Diverticulosis of large intestine without perforation or abscess without bleeding: Secondary | ICD-10-CM

## 2011-05-04 DIAGNOSIS — I10 Essential (primary) hypertension: Secondary | ICD-10-CM | POA: Diagnosis present

## 2011-05-04 DIAGNOSIS — I5032 Chronic diastolic (congestive) heart failure: Secondary | ICD-10-CM | POA: Diagnosis present

## 2011-05-04 DIAGNOSIS — Z79899 Other long term (current) drug therapy: Secondary | ICD-10-CM

## 2011-05-04 DIAGNOSIS — F3289 Other specified depressive episodes: Secondary | ICD-10-CM | POA: Diagnosis present

## 2011-05-04 HISTORY — PX: COLON SURGERY: SHX602

## 2011-05-04 NOTE — Op Note (Signed)
NAMEEION, TIMBROOK NO.:  [id number]  MEDICAL RECORD NO.:  [id number]  LOCATION:  2550                         FACILITY:  [facility]  PHYSICIAN:  Angelia Mould. Derrell Lolling, M.D.DATE OF BIRTH:  [birth year]  DATE OF PROCEDURE:  05/04/2011 DATE OF DISCHARGE:                              OPERATIVE REPORT   PREOPERATIVE DIAGNOSIS:  Villous adenoma of the cecum.  POSTOPERATIVE DIAGNOSIS:  Villous adenoma of the cecum.  OPERATION PERFORMED:  Laparoscopic-assisted right colectomy.  SURGEON:  Angelia Mould. Derrell Lolling, MD.  FIRST ASSISTANT:  Wilmon Arms. Tsuei, MD  OPERATIVE INDICATION:  This is a 65 year old Caucasian male who has a history of stable diastolic heart failure, history of alcohol dependence, depressive disorder, hypertension.  In March of this year, he underwent a repair of a right inguinal hernia and a large incarcerated umbilical hernia with mesh.  He recovered uneventfully.  He recently underwent screening colonoscopy and was found to have a large unresectable villous adenoma of the cecum.  Biopsy showed tubular adenoma, but no dysplasia.  He was referred for resection.  He was counseled as an outpatient.  He has undergone bowel prep at home.  He states that he has discontinued his aspirin 7 days ago.  He is operated upon electively.  FINDINGS:  There was a soft palpable mass in the cecum that felt to be about 3-1/2 cm in size.  There was no signs of any malignancy, no sign of any invasion of the wall of the colon.  There was no adenopathy.  The liver, gallbladder, duodenum and stomach looked normal.  The sigmoid colon, and transverse colon looked normal.  The appendix looked normal.  OPERATIVE TECHNIQUE:  Following the induction of general endotracheal anesthesia, a Foley catheter was placed.  The abdomen was prepped and draped in a sterile fashion.  Intravenous antibiotics were given. Surgical time-out was held identifying correct patient and  correct procedure.  A 5-mm optical trocar was placed in the left subcostal region without difficulty.  Pneumoperitoneum was created.  Video camera was inserted. There was a small adhesion of the omentum to the anterior abdominal wall, and the area of the mesh repair of his umbilical hernia.  We took that down with the Harmonic Scalpel at the beginning of the case.  We placed a 5 mm trocar in the left mid abdomen, a 5 mm trocar in the suprapubic area, a 5 mm trocar in the right lower quadrant and ultimately had to place a 5 mm trocar in the upper midline.  After exploring the abdomen, we positioned the patient.  After sweeping the small bowel out of the way we identified the terminal ileum, the appendix, and the ileocecal valve.  Using the Harmonic Scalpel, we incised the peritoneum over the terminal ileum and the cecum and we were able to mobilize the cecum and the terminal ileum medially quite nicely. We continued the dissection up the right paracolic gutter until we got all the way into the hepatic flexure.  We mobilized the right colon fairly nicely from lateral to medial.  We then repositioned the patient and took down the transverse colon to the right of the midline by dividing its attachments.  We  were very careful to identify the pylorus and the 1st and 2nd portions of the duodenum and we had good visualization of that as we swept the hepatic flexure down.  Once we had all of this done, we found that we could mobilize the cecum and move it up over on to the left lobe of the liver. At this point, we felt we had enough mobilization.  We planned very carefully to make a midline incision in the upper abdomen, so that we did not cut across the mesh repair of his large umbilical hernia.  We made about a 7 cm incision in the upper midline. We divided the fascia with electrocautery.  The tissues did not have a lot of strength or integrity, but there was no hernia or defect.  A  self- retaining wound protector was placed.  The cecum was delivered into the operative wound.  We took down a few other adhesions to help mobilize the right colon and transverse colon up into the wound.  There was no bleeding.  We could palpate a soft fleshy mass in the cecum and we felt this was consistent with colonoscopic findings.  We transected the terminal ileum with a GIA stapling device.  We created a window in the terminal ileal mesentery about 3 inches proximal to the ileocecal valve. We placed a stapleracross this area,  closed it and held it in place for 45 seconds, fired it and removed it.  We had minimal bleeding.  We then identified an area of the right transverse colon just to the right of the middle colic vessels. We created a window there as well, and placed a GIA stapler across the right transverse colon just to the right of middle colic vessels.  We closed the stapler, held it in place for 45 seconds, fired it, and removed it.  We had minimal bleeding.  We then took down the mesentery very carefully.  Keeping the duodenum in view, we divided the smaller mesenteric vessels with the LigaSure device and larger vessels were clamped, divided, and doubly ligated with 2-0 silk ties.  The specimen was removed and sent to the lab.  The pathologist looked at it and said there were 2 fleshy polyps of the cecum each about 3 cm in size.  He said we had widely negative proximal and distal margins.  We did not feel there was any indication for frozen section as we had done a cancer resection in our opinion.  We irrigated a little bit.  There was no bleeding.  We created an anastomosis between the terminal ileum and the mid transverse colon using a GIA stapling device.  We very carefully inspected the lumen of the colon and found no bleeding along the staple line or anywhere internally.  We then closed the common defect with a TA 60 stapling device.  We changed our instruments and  gloves and suction devices at this point.  We did have some bleeding from the TA staple line, and the  and we controlled that with multiple interrupted figure- of-eight sutures of 3-0 silk.  We closed the mesentery with interrupted sutures of 3-0 silk as well.  We had 1 bleeder from the small bowel mesentery which required over-sewing and then we had good hemostasis and good closure of the mesentery.  We checked the anastomosis.  We placed a couple of sutures at critical points along the staple lines for further reinforcement. The bowel was pink and had good vascularity.  We then  dropped the bowel and omentum back into the abdominal cavity.  We irrigated the abdomen and pelvis with 2 L of saline.  The irrigation fluid came back fairly clear.  We had checked the anastomosis 1 more time and it looked fine.  We closed the midline fascia with a running suture of #1 double-stranded PDS.  We injected Exparel into the all of the incisions.  We mixed the Exparel 1-1 with injectable saline, and probably used about 20 mL of the 50% solution. We then irrigated all the wounds and closed all the skin incisions with subcuticular sutures of 4-0 Monocryl and Steri-Strips.  It should be mention that prior to closing of the trocar sites, we did re-insufflate and put the camera and we looked around.  There was some fluid in the pelvis, there was some fluid above the liver, which was evacuated, it was very thin.  We checked the anastomosis, it looked fine.  We then removed all the trocars and released the pneumoperitoneum and then closed all of the skin incisions with Monocryl.  Steri-Strips were placed.  The patient tolerated the procedure well and was taken to the recovery room in stable condition.  ESTIMATED BLOOD LOSS:  About 75 mL.  COMPLICATIONS:  None.  Sponge, needle, and instrument counts were correct.     Angelia Mould. Derrell Lolling, M.D.     HMI/MEDQ  D:  05/04/2011  T:  05/04/2011  Job:   [id number]  cc:   Graylin Shiver, M.D. Rollene Rotunda, MD, [facility] Massie Maroon, MD  Electronically Signed by Claud Kelp M.D. on 05/04/2011 01:15:30 PM

## 2011-05-05 LAB — BASIC METABOLIC PANEL
BUN: 6 mg/dL (ref 6–23)
CO2: 27 mEq/L (ref 19–32)
Calcium: 8.6 mg/dL (ref 8.4–10.5)
Chloride: 94 mEq/L — ABNORMAL LOW (ref 96–112)
Creatinine, Ser: 0.73 mg/dL (ref 0.50–1.35)
GFR calc Af Amer: >90 mL/min (ref >90)

## 2011-05-05 LAB — CBC
HCT: 36.3 % — ABNORMAL LOW (ref 39.0–52.0)
MCH: 33 pg (ref 26.0–34.0)
MCV: 92.1 fL (ref 78.0–100.0)
Platelets: 145 10*3/uL — ABNORMAL LOW (ref 150–400)
RDW: 13.6 % (ref 11.5–15.5)
WBC: 8.4 10*3/uL (ref 4.0–10.5)

## 2011-05-07 LAB — POTASSIUM: Potassium: 3.9 mEq/L (ref 3.5–5.1)

## 2011-05-07 LAB — CREATININE, SERUM: GFR calc Af Amer: >90 mL/min (ref >90)

## 2011-05-13 ENCOUNTER — Telehealth (INDEPENDENT_AMBULATORY_CARE_PROVIDER_SITE_OTHER): Payer: Self-pay

## 2011-05-13 NOTE — Telephone Encounter (Signed)
Per Inetta Fermo pt home doing well and po appt made.

## 2011-05-20 ENCOUNTER — Ambulatory Visit (INDEPENDENT_AMBULATORY_CARE_PROVIDER_SITE_OTHER): Payer: Medicare Other | Admitting: General Surgery

## 2011-05-20 ENCOUNTER — Encounter (INDEPENDENT_AMBULATORY_CARE_PROVIDER_SITE_OTHER): Payer: Self-pay | Admitting: General Surgery

## 2011-05-20 VITALS — BP 128/70 | HR 68 | Temp 98.2°F | Resp 14 | Ht 69.0 in | Wt 199.0 lb

## 2011-05-20 DIAGNOSIS — Z9889 Other specified postprocedural states: Secondary | ICD-10-CM

## 2011-05-20 NOTE — Patient Instructions (Signed)
You have recovered nicely from your colon surgery. I encourage ambulation. Please adhere to a high fiber, low fat diet and stay hydrated. You will be referred for a physical therapy evaluation for balance and coordination. You need to have a colonoscopy in one year. Return to see me if further problems arise.  Removal of the Colon (Colectomy) Care After AFTER THE PROCEDURE After surgery, you will be taken to the recovery area where a nurse will watch you and check your progress. After the recovery area you will go to your hospital room. Your surgeon will determine when it is alright for you to take fluids and foods. You will be given pain medications to keep you comfortable. HOME CARE INSTRUCTIONS  Once home, an ice pack applied to the operative site may help with discomfort and keep swelling down.   Change dressings as directed.   Only take over-the-counter or prescription medicines for pain, discomfort, or fever as directed by your caregiver.   You may continue normal diet and activities as directed.   There should be no heavy lifting (more than 10 pounds), strenuous activities or contact sports for three weeks, or as directed.   Keep the wound dry and clean. The wound may be washed gently with soap and water. Gently blot or dab dry following cleansing, without rubbing. Do not take baths, use swimming pools, or use hot tubs for ten days, or as instructed by your caregivers.   If you have a colostomy, care for it as you have been shown.  SEEK MEDICAL CARE IF:   There is redness, swelling, or increasing pain in the wound area.   Pus is coming from the wound.   An unexplained oral temperature above 102 F (38.9 C) develops.   You notice a foul smell coming from the wound or dressing.   There is a breaking open of a wound (edges not staying together) after the sutures have been removed.   There is increasing abdominal pain.  SEEK IMMEDIATE MEDICAL CARE IF:   A rash develops.    There is difficulty breathing, or development of a reaction or side effects to medications given.  Document Released: 01/27/2004 Document Revised: 03/10/2011 Document Reviewed: 08/01/2007 [facility] Patient Information 2012 [place], [place].

## 2011-05-20 NOTE — Progress Notes (Signed)
Subjective:     Patient ID: Scott Newman, male   DOB: June 09, 1946, 65 y.o.   MRN: [id number]  HPI This patient underwent a laparoscopic assisted right colectomy on May 04, 2011. He had 2 villous adenomas of the cecum but there was no evidence of malignancy. 22 lymph nodes were evaluated and all were negative.  The patient is recovering uneventfully. His appetite has returned to normal. He was a little constipated but that has resolved he takes MiraLax from time to time. He has no problems about his wound. He has no pain. His only concern is weakness and a little bit of problem with balance and coordination.  Review of Systems     Objective:   Physical Exam Patient is here with his wife. He looks well.  Abdomen soft and nontender. All the wounds have healed nicely. There is no hernia or infection. He is ambulating with a cane   Assessment:     Status post laparoscopic-assisted right colectomy for villous adenoma. Recovering uneventfully without apparent complications  Deconditioned with problems with balance and coordination.    Plan:     The patient will be referred to outpatient physical therapy for strength, balance, and coordination management  I've advised colonoscopy in one year and periodically thereafter.  Return to see me p.r.n.

## 2011-05-25 NOTE — Discharge Summary (Signed)
Scott Newman, Scott Newman NO.:  [id number]  MEDICAL RECORD NO.:  [id number]  LOCATION:  5153                         FACILITY:  [facility]  PHYSICIAN:  Angelia Mould. Derrell Lolling, M.D.DATE OF BIRTH:  10-10-1945  DATE OF ADMISSION:  05/04/2011 DATE OF DISCHARGE:  05/08/2011                              DISCHARGE SUMMARY   FINAL DIAGNOSES: 1. Villous adenomas of the cecum. 2. Diastolic congestive heart failure. 3. History of alcohol dependence, in recovery since December 2011. 4. Depressive disorder. 5. Hypertension. 6. History of recent repair of large umbilical hernia with mesh and     right inguinal hernia with mesh. 7. History of abnormal liver function test with negative serology. 8. History of lower extremity venous insufficiency. 9. Practicing Jehovah's Witness.  OPERATIONS PERFORMED:  Laparoscopic-assisted right colectomy, date May 04, 2011.  HISTORY:  This is a 65 year old Caucasian man with a history of multiple medical problems as described above.  In March 2012, he underwent repair of right inguinal hernia with mesh and a large incarcerated umbilical hernia with mesh.  He did recover.  He has mild chronic deconditioning. Recent screening colonoscopy showed a large, unresectable villous adenoma of the cecum and biopsy showed tubular adenoma, but no dysplasia.  He was referred for resection.  He was counseled as an outpatient.  He underwent bowel prep at home and was brought to the hospital electively for dissection.  HOSPITAL COURSE:  On the day of admission, the patient underwent a laparoscopic-assisted right colectomy.  We found a soft palpable mass in the cecum about 3.5 cm in size.  There was no gross evidence of malignancy.  Primary anastomosis was performed. Final pathology report showed 2 tubular adenomas, 3.5 cm and 3.2 cm, a benign appendix, no dysplasia, and there was no evidence of cancer in 22/22 lymph nodes.  Postoperatively, the patient did  well.  We advanced his diet and activities slowly, but uneventfully.  He is a little deconditioned and a little weak and we involved physical therapy in his care.  He began having stools on the third postop day along with flatus and his diet was advanced.  He was ready for discharge on May 08, 2011.  At that time, he had, had bowel movements and was passing flatus.  He was able to ambulate, but did so with some difficulty with physical therapy.  We arranged for home health physical therapy.  He was asked to return to the office in 1-2 weeks.  Discharge instructions were given.  Diet and activities were discussed.  His wounds looked fine at the time of discharge.     Angelia Mould. Derrell Lolling, M.D.     HMI/MEDQ  D:  05/25/2011  T:  05/25/2011  Job:  [id number]  cc:   Massie Maroon, MD Graylin Shiver, M.D. Rollene Rotunda, MD, [facility]

## 2011-06-16 ENCOUNTER — Ambulatory Visit: Payer: Medicare Other | Admitting: Physical Therapy

## 2011-06-29 ENCOUNTER — Ambulatory Visit: Payer: Medicare Other | Admitting: Cardiology

## 2011-11-02 ENCOUNTER — Other Ambulatory Visit: Payer: Self-pay | Admitting: Physician Assistant

## 2012-05-05 ENCOUNTER — Other Ambulatory Visit: Payer: Self-pay | Admitting: Cardiology

## 2012-05-05 NOTE — Telephone Encounter (Signed)
..   Requested Prescriptions   Pending Prescriptions Disp Refills  . lisinopril (PRINIVIL,ZESTRIL) 20 MG tablet [Pharmacy Med Name: LISINOPRIL 20MG  TABLETS] 30 tablet 1    Sig: TAKE 1 TABLET BY MOUTH EVERY DAY  .Marland KitchenPatient needs to contact office to schedule  Appointment  for future refills.[phone]. Thank you.

## 2012-07-06 ENCOUNTER — Other Ambulatory Visit: Payer: Self-pay

## 2012-07-10 ENCOUNTER — Telehealth: Payer: Self-pay | Admitting: Cardiology

## 2012-07-10 MED ORDER — LISINOPRIL 20 MG PO TABS: ORAL_TABLET | ORAL | Status: DC

## 2012-07-10 NOTE — Telephone Encounter (Signed)
Pt's wife calling for pt, he needs refill of linsinopril  20mg , out of refills, uses [facility] [address]

## 2012-09-06 ENCOUNTER — Other Ambulatory Visit: Payer: Self-pay

## 2012-09-06 MED ORDER — LISINOPRIL 20 MG PO TABS: ORAL_TABLET | ORAL | Status: DC

## 2012-09-06 NOTE — Telephone Encounter (Signed)
..   Requested Prescriptions   Pending Prescriptions Disp Refills  . lisinopril (PRINIVIL,ZESTRIL) 20 MG tablet 30 tablet 0    Sig: TAKE 1 TABLET BY MOUTH EVERY DAY

## 2012-09-06 NOTE — Telephone Encounter (Signed)
..   Requested Prescriptions   Signed Prescriptions Disp Refills  . lisinopril (PRINIVIL,ZESTRIL) 20 MG tablet 30 tablet 1    Sig: TAKE 1 TABLET BY MOUTH EVERY DAY    Authorizing Provider: Rollene Rotunda    Ordering User: Christella Hartigan, Sherrita Riederer Judie Petit

## 2012-09-11 ENCOUNTER — Encounter: Payer: Self-pay | Admitting: Cardiology

## 2012-09-11 ENCOUNTER — Ambulatory Visit (INDEPENDENT_AMBULATORY_CARE_PROVIDER_SITE_OTHER): Payer: Medicare Other | Admitting: Cardiology

## 2012-09-11 VITALS — BP 124/62 | HR 80 | Ht 69.0 in | Wt 209.2 lb

## 2012-09-11 DIAGNOSIS — E785 Hyperlipidemia, unspecified: Secondary | ICD-10-CM

## 2012-09-11 DIAGNOSIS — I5032 Chronic diastolic (congestive) heart failure: Secondary | ICD-10-CM

## 2012-09-11 DIAGNOSIS — I872 Venous insufficiency (chronic) (peripheral): Secondary | ICD-10-CM

## 2012-09-11 DIAGNOSIS — I10 Essential (primary) hypertension: Secondary | ICD-10-CM

## 2012-09-11 LAB — BASIC METABOLIC PANEL
CO2: 23 mEq/L (ref 19–32)
Chloride: 98 mEq/L (ref 96–112)
Potassium: 4.6 mEq/L (ref 3.5–5.1)

## 2012-09-11 MED ORDER — LISINOPRIL 20 MG PO TABS: ORAL_TABLET | ORAL | Status: DC

## 2012-09-11 NOTE — Patient Instructions (Addendum)
The current medical regimen is effective;  continue present plan and medications.  Please have blood work today.  Follow up in 18 months with Dr Antoine Poche.  You will receive a letter in the mail 2 months before you are due.  Please call us when you receive this letter to schedule your follow up appointment.

## 2012-09-11 NOTE — Progress Notes (Signed)
   HPI The patient presents for followup of hypertension and aortic stenosis. Since I  last saw him he has had no new cardiovascular complaints. He's had no new shortness of breath, PND or orthopnea. He's had no palpitations, presyncope or syncope. His blood pressure has been well controlled.  He does get some atypical chest discomfort periodically. For this he might take a CoQ10 or even increase lisinopril. This is a rare occurrence however. He's not exercising as much as I would like.   No Known Allergies  Current Outpatient Prescriptions  Medication Sig Dispense Refill  . aspirin 325 MG tablet Take 325 mg by mouth as needed.        Marland Kitchen escitalopram (LEXAPRO) 5 MG tablet Take 5 mg by mouth daily.        . Folic Acid 200 MCG TABS Take 2,000 mcg by mouth daily.       Marland Kitchen lisinopril (PRINIVIL,ZESTRIL) 20 MG tablet TAKE 1 TABLET BY MOUTH EVERY DAY  30 tablet  1  . Multiple Vitamin (MULTI-VITAMIN PO) Take by mouth daily.         No current facility-administered medications for this visit.    Past Medical History  Diagnosis Date  . History of cellulitis and abscess   . Arthritis   . Hernia     umbilical and inguinal  . Colon polyp   . Chronic diastolic heart failure     echocardiogram 12/11: EF 55-60%, grade 1 diastolic dysfunction, mild aortic stenosis with a mean gradient of 6 mm of mercury, moderate mitral annular calcification, mild LAE, trivial pericardial effusion;    Myoview 12/11 (during admission for heart failure): No ischemia or scar, EF 63%.  . Aortic stenosis   . HTN (hypertension)   . Depression   . History of alcohol abuse     Past Surgical History  Procedure Laterality Date  . Hernia repair      umbilical and inguinal- rt  . Colon surgery  05/04/11    Lap assisted right colectomy    ROS:  As stated in the HPI and negative for all other systems.  PHYSICAL EXAM BP 124/62  Pulse 80  Ht 5\' 9"  (1.753 m)  Wt 209 lb 3.2 oz (94.892 kg)  BMI 30.88 kg/m2 GENERAL:  Well  appearing HEENT:  Pupils equal round and reactive, fundi not visualized, oral mucosa unremarkable NECK:  No jugular venous distention, waveform within normal limits, carotid upstroke brisk and symmetric, no bruits, no thyromegaly LYMPHATICS:  No cervical, inguinal adenopathy LUNGS:  Clear to auscultation bilaterally BACK:  No CVA tenderness CHEST:  Unremarkable HEART:  PMI not displaced or sustained,S1 and S2 within normal limits, no S3, no S4, no clicks, no rubs, no murmurs ABD:  Flat, positive bowel sounds normal in frequency in pitch, no bruits, no rebound, no guarding, no midline pulsatile mass, no hepatomegaly, no splenomegaly EXT:  2 plus pulses throughout, no edema, no cyanosis no clubbing    ASSESSMENT AND PLAN  DIASTOLIC HF:  He seems to be euvolemic.  At this point, no change in therapy is indicated.  We have reviewed salt and fluid restrictions.  No further cardiovascular testing is indicated.  AS:  This has been mild no change in therapy or further imaging is indicated at this point.  HTN:  The patient's blood pressure is well controlled. I will check a basic metabolic profile today.

## 2013-10-08 ENCOUNTER — Other Ambulatory Visit: Payer: Self-pay

## 2013-10-08 MED ORDER — LISINOPRIL 20 MG PO TABS: ORAL_TABLET | ORAL | Status: DC

## 2014-03-11 ENCOUNTER — Encounter: Payer: Self-pay | Admitting: Cardiology

## 2014-03-11 ENCOUNTER — Ambulatory Visit (INDEPENDENT_AMBULATORY_CARE_PROVIDER_SITE_OTHER): Payer: Medicare Other | Admitting: Cardiology

## 2014-03-11 VITALS — BP 120/76 | HR 60 | Ht 67.25 in | Wt 215.3 lb

## 2014-03-11 DIAGNOSIS — I359 Nonrheumatic aortic valve disorder, unspecified: Secondary | ICD-10-CM

## 2014-03-11 DIAGNOSIS — I10 Essential (primary) hypertension: Secondary | ICD-10-CM

## 2014-03-11 DIAGNOSIS — I350 Nonrheumatic aortic (valve) stenosis: Secondary | ICD-10-CM

## 2014-03-11 NOTE — Progress Notes (Signed)
   HPI The patient presents for followup of hypertension and aortic stenosis. Since I  last saw him he has had no new cardiovascular complaints. He's had no new shortness of breath, PND or orthopnea. He's had no palpitations, presyncope or syncope. He is not taking his BP meds at home.  He does get some atypical chest discomfort periodically but this is unchanged.  He does say that he does not feel well he will sometimes take an extra lisinopril. However, he doesn't have any review his blood pressure is doing at that time.  No Known Allergies  Current Outpatient Prescriptions  Medication Sig Dispense Refill  . aspirin 325 MG tablet Take 325 mg by mouth as needed.        Marland Kitchen escitalopram (LEXAPRO) 5 MG tablet Take 5 mg by mouth daily.        . Folic Acid 824 MCG TABS Take 2,000 mcg by mouth daily.       Marland Kitchen lisinopril (PRINIVIL,ZESTRIL) 20 MG tablet TAKE 1 TABLET BY MOUTH EVERY DAY  90 tablet  3  . Multiple Vitamin (MULTI-VITAMIN PO) Take by mouth daily.         No current facility-administered medications for this visit.    Past Medical History  Diagnosis Date  . History of cellulitis and abscess   . Arthritis   . Hernia     umbilical and inguinal  . Colon polyp   . Chronic diastolic heart failure     echocardiogram 12/11: EF 23-53%, grade 1 diastolic dysfunction, mild aortic stenosis with a mean gradient of 6 mm of mercury, moderate mitral annular calcification, mild LAE, trivial pericardial effusion;    Myoview 12/11 (during admission for heart failure): No ischemia or scar, EF 63%.  . Aortic stenosis   . HTN (hypertension)   . Depression   . History of alcohol abuse     Past Surgical History  Procedure Laterality Date  . Hernia repair      umbilical and inguinal- rt  . Colon surgery  05/04/11    Lap assisted right colectomy    ROS:  As stated in the HPI and negative for all other systems.  PHYSICAL EXAM BP 120/76  Pulse 60  Ht 5' 7.25" (1.708 m)  Wt 215 lb 4.8 oz (97.659  kg)  BMI 33.48 kg/m2 GENERAL:  Well appearing NECK:  No jugular venous distention, waveform within normal limits, carotid upstroke brisk and symmetric, no bruits, no thyromegaly LUNGS:  Clear to auscultation bilaterally CHEST:  Unremarkable HEART:  PMI not displaced or sustained,S1 and S2 within normal limits, no S3, no S4, no clicks, no rubs, no murmurs ABD:  Flat, positive bowel sounds normal in frequency in pitch, no bruits, no rebound, no guarding, no midline pulsatile mass, no hepatomegaly, no splenomegaly EXT:  2 plus pulses throughout, no edema, no cyanosis no clubbing  EKG:  Sinus rhythm, rate 60, axis within normal limits, intervals within normal limits, nonspecific T-wave flattening. 03/11/2014   ASSESSMENT AND PLAN  DIASTOLIC HF:  He seems to be euvolemic.  At this point, no change in therapy is indicated.  We have reviewed salt and fluid restrictions.  No further cardiovascular testing is indicated.  Idiscouraged him from taking a urine lisinopril.  AS:  This has been mild no change in therapy or further imaging is indicated at this point.  HTN:  The patient's blood pressure is well controlled.

## 2014-03-11 NOTE — Patient Instructions (Signed)
Your physician recommends that you schedule a follow-up appointment in:  One year with Dr. Hochrein  

## 2014-03-13 NOTE — Addendum Note (Signed)
Addended by: Leanora Cover C. on: 03/13/2014 10:07 AM   Modules accepted: Orders

## 2014-07-29 ENCOUNTER — Ambulatory Visit (INDEPENDENT_AMBULATORY_CARE_PROVIDER_SITE_OTHER): Payer: Medicare Other | Admitting: Neurology

## 2014-07-29 ENCOUNTER — Encounter: Payer: Self-pay | Admitting: Neurology

## 2014-07-29 VITALS — BP 180/85 | HR 70 | Ht 68.0 in | Wt 221.0 lb

## 2014-07-29 DIAGNOSIS — R269 Unspecified abnormalities of gait and mobility: Secondary | ICD-10-CM | POA: Insufficient documentation

## 2014-07-29 DIAGNOSIS — R200 Anesthesia of skin: Secondary | ICD-10-CM

## 2014-07-29 DIAGNOSIS — R209 Unspecified disturbances of skin sensation: Secondary | ICD-10-CM | POA: Diagnosis not present

## 2014-07-29 NOTE — Progress Notes (Signed)
PATIENT: Scott Newman DOB: 01-05-1946  HISTORICAL  Scott Newman is a 69 years old right-handed male, referred by his primary care physician Dr. Maudie Mercury, with his wife  for evaluation of various neurological symptoms neck graft he has past medical history of hypertension, anxiety, congestive heart failure, hyperlipidemia, depression,  He is disabled from his depression many years ago, symptoms overall has been fairly steady over the past 5 years, however since the summer of 2015, he was noted to have intermittent body jerking movement, complains of excessive daytime sleepiness, fatigue, also complains of slow moving his lips, mild slurred speech,drooling  He also complains of unsteady gait, memory trouble,  REVIEW OF SYSTEMS: Full 14 system review of systems performed and notable only for fever, fatigue, hearing loss, trouble swallowing, blurred vision, shortness of breath, constipations, importance, feeling cold, joint pain, achy muscles, numbness, weakness, slurred speech, difficulty swallowing, sleepiness, depression, anxiety, decreased energy.  ALLERGIES: No Known Allergies  HOME MEDICATIONS: Current Outpatient Prescriptions on File Prior to Visit  Medication Sig Dispense Refill  . aspirin 325 MG tablet Take 325 mg by mouth as needed.      Marland Kitchen escitalopram (LEXAPRO) 5 MG tablet Take 5 mg by mouth daily.      . Folic Acid 301 MCG TABS Take 2,000 mcg by mouth daily.     Marland Kitchen LINZESS 145 MCG CAPS capsule     . lisinopril (PRINIVIL,ZESTRIL) 20 MG tablet TAKE 1 TABLET BY MOUTH EVERY DAY 90 tablet 3  . Multiple Vitamin (MULTI-VITAMIN PO) Take by mouth daily.       No current facility-administered medications on file prior to visit.    PAST MEDICAL HISTORY: Past Medical History  Diagnosis Date  . History of cellulitis and abscess   . Arthritis   . Hernia     umbilical and inguinal  . Colon polyp   . Chronic diastolic heart failure     echocardiogram 12/11: EF 55-60%, grade 1  diastolic dysfunction, mild aortic stenosis with a mean gradient of 6 mm of mercury, moderate mitral annular calcification, mild LAE, trivial pericardial effusion;    Myoview 12/11 (during admission for heart failure): No ischemia or scar, EF 63%.  . Aortic stenosis   . HTN (hypertension)   . Depression   . History of alcohol abuse     PAST SURGICAL HISTORY: Past Surgical History  Procedure Laterality Date  . Hernia repair      umbilical and inguinal- rt  . Colon surgery  05/04/11    Lap assisted right colectomy    FAMILY HISTORY: Family History  Problem Relation Age of Onset  . Heart disease Father     SOCIAL HISTORY:  History   Social History  . Marital Status: Married    Spouse Name: N/A    Number of Children: N/A  . Years of Education: N/A   Occupational History  . Industrial work   Social History Main Topics  . Smoking status: Former Smoker    Quit date: 03/17/1965  . Smokeless tobacco: Never Used  . Alcohol Use: No     Comment: RECOVERING ALCOHOLIC  . Drug Use: No  . Sexual Activity: Not on file   Other Topics Concern  . Not on file   Social History Narrative     PHYSICAL EXAM   Filed Vitals:   07/29/14 0938  BP: 180/85  Pulse: 70  Height: 5\' 8"  (1.727 m)  Weight: 221 lb (100.245 kg)    Not recorded  Body mass index is 33.61 kg/(m^2).   Generalized: In no acute distress  Neck: Supple, no carotid bruits   Cardiac: Regular rate rhythm  Pulmonary: Clear to auscultation bilaterally  Musculoskeletal: No deformity  Neurological examination  Mentation: Alert oriented to time, place, history taking, and causual conversation  Cranial nerve II-XII: Pupils were equal round reactive to light. Extraocular movements were full.  Visual field were full on confrontational test. Bilateral fundi were sharp.  Facial sensation and strength were normal. Hearing was intact to finger rubbing bilaterally. Uvula tongue midline.  Head turning and  shoulder shrug and were normal and symmetric.Tongue protrusion into cheek strength was normal.  Motor: Normal tone, bulk and strength.  Sensory: Intact to fine touch, pinprick, preserved vibratory sensation, and proprioception at toes.  Coordination: Normal finger to nose, heel-to-shin bilaterally there was no truncal ataxia  Gait: exaggerated gait difficulty, but variable effort, able to keep his balance in an awkard position  Deep tendon reflexes: Brachioradialis 2/2, biceps 2/2, triceps 2/2, patellar 2/2, Achilles 2/2, plantar responses were flexor bilaterally.   DIAGNOSTIC DATA (LABS, IMAGING, TESTING) - I reviewed patient records, labs, notes, testing and imaging myself where available.  Lab Results  Component Value Date   WBC 8.4 05/05/2011   HGB 14.2 05/07/2011   HCT 36.3* 05/05/2011   MCV 92.1 05/05/2011   PLT 145* 05/05/2011      Component Value Date/Time   NA 132* 09/11/2012 1041   K 4.6 09/11/2012 1041   CL 98 09/11/2012 1041   CO2 23 09/11/2012 1041   GLUCOSE 102* 09/11/2012 1041   BUN 12 09/11/2012 1041   CREATININE 0.8 09/11/2012 1041   CALCIUM 9.4 09/11/2012 1041   PROT 7.9 04/30/2011 1307   ALBUMIN 4.4 04/30/2011 1307   AST 28 04/30/2011 1307   ALT 24 04/30/2011 1307   ALKPHOS 65 04/30/2011 1307   BILITOT 0.7 04/30/2011 1307   GFRNONAA >90 05/07/2011 0617   GFRAA >90 05/07/2011 0617   Lab Results  Component Value Date   CHOL  06/23/2010    153        ATP III CLASSIFICATION:  <200     mg/dL   Desirable  200-239  mg/dL   Borderline High  >=240    mg/dL   High          HDL 25* 06/23/2010   LDLCALC * 06/23/2010    107        Total Cholesterol/HDL:CHD Risk Coronary Heart Disease Risk Table                     Men   Women  1/2 Average Risk   3.4   3.3  Average Risk       5.0   4.4  2 X Average Risk   9.6   7.1  3 X Average Risk  23.4   11.0        Use the calculated Patient Ratio above and the CHD Risk Table to determine the patient's CHD  Risk.        ATP III CLASSIFICATION (LDL):  <100     mg/dL   Optimal  100-129  mg/dL   Near or Above                    Optimal  130-159  mg/dL   Borderline  160-189  mg/dL   High  >190     mg/dL   Very High   TRIG 107  06/23/2010   CHOLHDL 6.1 06/23/2010   ASSESSMENT AND PLAN  Scott Newman is a 69 y.o. male With past medical history of depression, now complains of constellation of complains, including unsteady gait, variable effort on examinations  1, MRI of the brain to rule out central nervous system structural relation 2, laboratory evaluations   Orders Placed This Encounter  Procedures  . MR Brain Wo Contrast  . Vitamin B12  . RPR  . Comprehensive metabolic panel  . Folate  . C-reactive protein  . Thyroid Panel With TSH  . CK  . Sedimentation rate  . CBC  . Home Health  . Face-to-face encounter (required for Medicare/Medicaid patients)       Return in about 2 months (around 09/27/2014).    Marcial Pacas, M.D. Ph.D.  [facility] [address] [place], [place] [address] [phone]

## 2014-07-30 ENCOUNTER — Telehealth: Payer: Self-pay | Admitting: Neurology

## 2014-07-30 LAB — CBC
HEMATOCRIT: 37.7 % (ref 37.5–51.0)
Hemoglobin: 13 g/dL (ref 12.6–17.7)
MCH: 31 pg (ref 26.6–33.0)
MCHC: 34.5 g/dL (ref 31.5–35.7)
MCV: 90 fL (ref 79–97)
PLATELETS: 255 10*3/uL (ref 150–379)
RBC: 4.19 x10E6/uL (ref 4.14–5.80)
RDW: 17.2 % — AB (ref 12.3–15.4)
WBC: 8.4 10*3/uL (ref 3.4–10.8)

## 2014-07-30 LAB — VITAMIN B12: Vitamin B-12: 632 pg/mL (ref 211–946)

## 2014-07-30 LAB — COMPREHENSIVE METABOLIC PANEL
ALBUMIN: 5 g/dL — AB (ref 3.5–4.8)
ALK PHOS: 52 IU/L (ref 39–117)
ALT: 22 IU/L (ref 0–44)
AST: 74 IU/L — ABNORMAL HIGH (ref 0–40)
Albumin/Globulin Ratio: 1.7 (ref 1.1–2.5)
BUN/Creatinine Ratio: 8 — ABNORMAL LOW (ref 10–22)
BUN: 11 mg/dL (ref 8–27)
CALCIUM: 10.1 mg/dL (ref 8.6–10.2)
CHLORIDE: 92 mmol/L — AB (ref 97–108)
CO2: 24 mmol/L (ref 18–29)
CREATININE: 1.37 mg/dL — AB (ref 0.76–1.27)
GFR, EST AFRICAN AMERICAN: 60 mL/min/{1.73_m2} (ref >59)
GFR, EST NON AFRICAN AMERICAN: 52 mL/min/{1.73_m2} — AB (ref >59)
GLOBULIN, TOTAL: 3 g/dL (ref 1.5–4.5)
GLUCOSE: 85 mg/dL (ref 65–99)
POTASSIUM: 4.4 mmol/L (ref 3.5–5.2)
Sodium: 133 mmol/L — ABNORMAL LOW (ref 134–144)
TOTAL PROTEIN: 8 g/dL (ref 6.0–8.5)
Total Bilirubin: 0.5 mg/dL (ref 0.0–1.2)

## 2014-07-30 LAB — SEDIMENTATION RATE: Sed Rate: 9 mm/hr (ref 0–30)

## 2014-07-30 LAB — THYROID PANEL WITH TSH
T3 Uptake Ratio: 18 % — ABNORMAL LOW (ref 24–39)
T4, Total: <0.5 ug/dL — CL (ref 4.5–12.0)
TSH: 26.32 u[IU]/mL — ABNORMAL HIGH (ref 0.450–4.500)

## 2014-07-30 LAB — RPR QUALITATIVE: RPR: NONREACTIVE

## 2014-07-30 LAB — CK: Total CK: 3004 U/L (ref 24–204)

## 2014-07-30 LAB — C-REACTIVE PROTEIN: CRP: 1.5 mg/L (ref 0.0–4.9)

## 2014-07-30 LAB — FOLATE: FOLATE: 17 ng/mL (ref >3.0)

## 2014-07-30 NOTE — Telephone Encounter (Signed)
Sharyn Lull RN called and left patient a message.

## 2014-07-30 NOTE — Telephone Encounter (Signed)
Dana/Michelle:  Please call patient, laboratory showed elevated TSH low thyroid level, also mild elevated creatinine,  I have faxed the laboratory results to his primary care physician Dr. Maudie Mercury, he should contact him for further evaluation and treatment of hypothyroidism

## 2014-07-31 NOTE — Telephone Encounter (Signed)
Spoke to patient's wife - she is aware of results and will schedule an appt with his PCP. Kathleen Lime

## 2014-08-05 DIAGNOSIS — I10 Essential (primary) hypertension: Secondary | ICD-10-CM | POA: Diagnosis not present

## 2014-08-05 DIAGNOSIS — E039 Hypothyroidism, unspecified: Secondary | ICD-10-CM | POA: Diagnosis not present

## 2014-08-05 DIAGNOSIS — E559 Vitamin D deficiency, unspecified: Secondary | ICD-10-CM | POA: Diagnosis not present

## 2014-08-05 DIAGNOSIS — Z125 Encounter for screening for malignant neoplasm of prostate: Secondary | ICD-10-CM | POA: Diagnosis not present

## 2014-08-22 ENCOUNTER — Ambulatory Visit
Admission: RE | Admit: 2014-08-22 | Discharge: 2014-08-22 | Disposition: A | Discharge status: Home / Self Care | Payer: Medicare Other | Source: Ambulatory Visit | Attending: Neurology | Admitting: Neurology

## 2014-08-22 DIAGNOSIS — R269 Unspecified abnormalities of gait and mobility: Secondary | ICD-10-CM

## 2014-08-22 DIAGNOSIS — R200 Anesthesia of skin: Secondary | ICD-10-CM | POA: Diagnosis not present

## 2014-08-27 ENCOUNTER — Telehealth: Payer: Self-pay | Admitting: Neurology

## 2014-08-27 NOTE — Telephone Encounter (Signed)
Spoke to [place] (on [employer]) - aware of results and will keep follow up appt.

## 2014-08-27 NOTE — Telephone Encounter (Signed)
Michelle:please call patient, MRI showed age-related changes, no acute findings. Keep current treatment plan, and follow up appt   IMPRESSION:  Abnormal MRI brain (without) demonstrating: 1. Mild scattered periventricular and subcortical foci of non-specific T2 hyperintensities, likely chronic small vessel ischemic disease.  2. Chronic left anterior putaminal lacunar infarction. 3. No acute findings.

## 2014-09-27 DIAGNOSIS — H3500 Unspecified background retinopathy: Secondary | ICD-10-CM | POA: Diagnosis not present

## 2014-09-27 DIAGNOSIS — H2513 Age-related nuclear cataract, bilateral: Secondary | ICD-10-CM | POA: Diagnosis not present

## 2014-10-03 ENCOUNTER — Encounter: Payer: Self-pay | Admitting: Neurology

## 2014-10-03 ENCOUNTER — Ambulatory Visit (INDEPENDENT_AMBULATORY_CARE_PROVIDER_SITE_OTHER): Payer: Medicare Other | Admitting: Neurology

## 2014-10-03 VITALS — BP 142/82 | HR 78 | Ht 68.0 in | Wt 206.0 lb

## 2014-10-03 DIAGNOSIS — R269 Unspecified abnormalities of gait and mobility: Secondary | ICD-10-CM | POA: Diagnosis not present

## 2014-10-03 DIAGNOSIS — R200 Anesthesia of skin: Secondary | ICD-10-CM

## 2014-10-03 NOTE — Progress Notes (Signed)
PATIENT: Scott Newman DOB: 08-Nov-1945  HISTORICAL  Webster Patrone Longoria is a 69 years old right-handed male, referred by his primary care physician Dr. Maudie Mercury, with his wife  for evaluation of various neurological symptoms neck graft he has past medical history of hypertension, anxiety, congestive heart failure, hyperlipidemia, depression,  He is disabled from his depression many years ago, symptoms overall has been fairly steady over the past 5 years, however since the summer of 2015, he was noted to have intermittent body jerking movement, complains of excessive daytime sleepiness, fatigue, also complains of slow moving his lips, mild slurred speech,drooling  He also complains of unsteady gait, memory trouble   UPDATE October 03 2014: He brought in the laboratory from July 2015, TSH was 30.7, LDL was elevated to 82 cholesterol 370, normal CBC, elevated creatinine of 1.4 7, sodium 130  Repeat laboratory in January 2016 also demonstrated elevated TSH, is now on thyroid supplement, his symptoms has improved, he has much less myoclonic jerking, feel more energetic,  We have reviewed MRI of the brain film, mild atrophy, small vessel disease, no acute lesions.  REVIEW OF SYSTEMS: Full 14 system review of systems performed and notable only for fatigue, drooling, joints pain, back pain, achy muscles, weakness, agitation, depression ALLERGIES: No Known Allergies  HOME MEDICATIONS: Current Outpatient Prescriptions on File Prior to Visit  Medication Sig Dispense Refill  . aspirin 325 MG tablet Take 325 mg by mouth as needed.      . Folic Acid 347 MCG TABS Take 2,000 mcg by mouth daily.     Marland Kitchen LINZESS 145 MCG CAPS capsule     . lisinopril (PRINIVIL,ZESTRIL) 20 MG tablet TAKE 1 TABLET BY MOUTH EVERY DAY 90 tablet 3  . Multiple Vitamin (MULTI-VITAMIN PO) Take by mouth daily.       No current facility-administered medications on file prior to visit.    PAST MEDICAL HISTORY: Past Medical History    Diagnosis Date  . History of cellulitis and abscess   . Arthritis   . Hernia     umbilical and inguinal  . Colon polyp   . Chronic diastolic heart failure     echocardiogram 12/11: EF 42-59%, grade 1 diastolic dysfunction, mild aortic stenosis with a mean gradient of 6 mm of mercury, moderate mitral annular calcification, mild LAE, trivial pericardial effusion;    Myoview 12/11 (during admission for heart failure): No ischemia or scar, EF 63%.  . Aortic stenosis   . HTN (hypertension)   . Depression   . History of alcohol abuse   . Numbness   . Slurred speech   . Difficulty swallowing   . Sleepiness   . Gait abnormality     PAST SURGICAL HISTORY: Past Surgical History  Procedure Laterality Date  . Hernia repair      umbilical and inguinal- rt  . Colon surgery  05/04/11    Lap assisted right colectomy    FAMILY HISTORY: Family History  Problem Relation Age of Onset  . Heart disease Father     SOCIAL HISTORY:  History   Social History  . Marital Status: Married    Spouse Name: N/A    Number of Children: N/A  . Years of Education: N/A   Occupational History  . Industrial work   Social History Main Topics  . Smoking status: Former Smoker    Quit date: 03/17/1965  . Smokeless tobacco: Never Used  . Alcohol Use: No     Comment: RECOVERING  ALCOHOLIC  . Drug Use: No  . Sexual Activity: Not on file   Other Topics Concern  . Not on file   Social History Narrative     PHYSICAL EXAM   Filed Vitals:   10/03/14 1332  BP: 142/82  Pulse: 78  Height: 5\' 8"  (1.727 m)  Weight: 206 lb (93.441 kg)    Not recorded      Body mass index is 31.33 kg/(m^2). PHYSICAL EXAMNIATION:  Gen: NAD, conversant, well nourised, obese, well groomed                     Cardiovascular: Regular rate rhythm, no peripheral edema, warm, nontender. Eyes: Conjunctivae clear without exudates or hemorrhage Neck: Supple, no carotid bruise. Pulmonary: Clear to auscultation  bilaterally   NEUROLOGICAL EXAM:  MENTAL STATUS: Speech:    Speech is normal; fluent and spontaneous with normal comprehension.  Cognition:    The patient is oriented to person, place, and time;     recent and remote memory intact;     language fluent;     normal attention, concentration,     fund of knowledge.  CRANIAL NERVES: CN II: Visual fields are full to confrontation. Fundoscopic exam is normal with sharp discs and no vascular changes. Venous pulsations are present bilaterally. Pupils are 4 mm and briskly reactive to light. Visual acuity is 20/20 bilaterally. CN III, IV, VI: extraocular movement are normal. No ptosis. CN V: Facial sensation is intact to pinprick in all 3 divisions bilaterally. Corneal responses are intact.  CN VII: Face is symmetric with normal eye closure and smile. CN VIII: Hearing is normal to rubbing fingers CN IX, X: Palate elevates symmetrically. Phonation is normal. CN XI: Head turning and shoulder shrug are intact CN XII: Tongue is midline with normal movements and no atrophy.  MOTOR: There is no pronator drift of out-stretched arms. Muscle bulk and tone are normal. Muscle strength is normal.   Shoulder abduction Shoulder external rotation Elbow flexion Elbow extension Wrist flexion Wrist extension Finger abduction Hip flexion Knee flexion Knee extension Ankle dorsi flexion Ankle plantar flexion  R 5 5 5 5 5 5 5 5 5 5 5 5   L 5 5 5 5 5 5 5 5 5 5 5 5     REFLEXES: Reflexes are 2+ and symmetric at the biceps, triceps, knees, and ankles. Plantar responses are flexor.  SENSORY: Light touch, pinprick, position sense, and vibration sense are intact in fingers and toes.  COORDINATION: Rapid alternating movements and fine finger movements are intact. There is no dysmetria on finger-to-nose and heel-knee-shin. There are no abnormal or extraneous movements.   GAIT/STANCE: Wide-based, unsteady, cautious   DIAGNOSTIC DATA (LABS, IMAGING, TESTING) - I  reviewed patient records, labs, notes, testing and imaging myself where available.  Lab Results  Component Value Date   WBC 8.4 07/29/2014   HGB 13.0 07/29/2014   HCT 37.7 07/29/2014   MCV 90 07/29/2014   PLT 255 07/29/2014      Component Value Date/Time   NA 133* 07/29/2014 1307   NA 132* 09/11/2012 1041   K 4.4 07/29/2014 1307   CL 92* 07/29/2014 1307   CO2 24 07/29/2014 1307   GLUCOSE 85 07/29/2014 1307   GLUCOSE 102* 09/11/2012 1041   BUN 11 07/29/2014 1307   BUN 12 09/11/2012 1041   CREATININE 1.37* 07/29/2014 1307   CALCIUM 10.1 07/29/2014 1307   PROT 8.0 07/29/2014 1307   PROT 7.9 04/30/2011 1307  ALBUMIN 4.4 04/30/2011 1307   AST 74* 07/29/2014 1307   ALT 22 07/29/2014 1307   ALKPHOS 52 07/29/2014 1307   BILITOT 0.5 07/29/2014 1307   GFRNONAA 52* 07/29/2014 1307   GFRAA 60 07/29/2014 1307   Lab Results  Component Value Date   CHOL  06/23/2010    153        ATP III CLASSIFICATION:  <200     mg/dL   Desirable  200-239  mg/dL   Borderline High  >=240    mg/dL   High          HDL 25* 06/23/2010   LDLCALC * 06/23/2010    107        Total Cholesterol/HDL:CHD Risk Coronary Heart Disease Risk Table                     Men   Women  1/2 Average Risk   3.4   3.3  Average Risk       5.0   4.4  2 X Average Risk   9.6   7.1  3 X Average Risk  23.4   11.0        Use the calculated Patient Ratio above and the CHD Risk Table to determine the patient's CHD Risk.        ATP III CLASSIFICATION (LDL):  <100     mg/dL   Optimal  100-129  mg/dL   Near or Above                    Optimal  130-159  mg/dL   Borderline  160-189  mg/dL   High  >190     mg/dL   Very High   TRIG 107 06/23/2010   CHOLHDL 6.1 06/23/2010   ASSESSMENT AND PLAN  PAUBLO WARSHAWSKY is a 69 y.o. male With past medical history of depression, now complains of constellation of complains,MRI of the brain showed mild atrophy, mild small vessel disease, laboratory evaluations confirmed  hypothyroidism, his symptoms has much improved after taking thyroid supplement   1, continue management with his primary care physician  2, moderate exercise  3, return to clinic as needed     Marcial Pacas, M.D. Ph.D.  [facility] [address], [place], [place] [address] [phone]

## 2014-10-10 DIAGNOSIS — E559 Vitamin D deficiency, unspecified: Secondary | ICD-10-CM | POA: Diagnosis not present

## 2014-10-10 DIAGNOSIS — Z125 Encounter for screening for malignant neoplasm of prostate: Secondary | ICD-10-CM | POA: Diagnosis not present

## 2014-10-10 DIAGNOSIS — E039 Hypothyroidism, unspecified: Secondary | ICD-10-CM | POA: Diagnosis not present

## 2014-10-17 DIAGNOSIS — I350 Nonrheumatic aortic (valve) stenosis: Secondary | ICD-10-CM | POA: Diagnosis not present

## 2014-10-17 DIAGNOSIS — E039 Hypothyroidism, unspecified: Secondary | ICD-10-CM | POA: Diagnosis not present

## 2014-10-17 DIAGNOSIS — R972 Elevated prostate specific antigen [PSA]: Secondary | ICD-10-CM | POA: Diagnosis not present

## 2014-10-17 DIAGNOSIS — E559 Vitamin D deficiency, unspecified: Secondary | ICD-10-CM | POA: Diagnosis not present

## 2014-10-18 ENCOUNTER — Other Ambulatory Visit: Payer: Self-pay | Admitting: Internal Medicine

## 2014-10-18 DIAGNOSIS — G4489 Other headache syndrome: Secondary | ICD-10-CM

## 2014-10-18 DIAGNOSIS — R42 Dizziness and giddiness: Secondary | ICD-10-CM

## 2014-10-31 ENCOUNTER — Ambulatory Visit
Admission: RE | Admit: 2014-10-31 | Discharge: 2014-10-31 | Disposition: A | Discharge status: Home / Self Care | Payer: Medicare Other | Source: Ambulatory Visit | Attending: Internal Medicine | Admitting: Internal Medicine

## 2014-10-31 DIAGNOSIS — R42 Dizziness and giddiness: Secondary | ICD-10-CM | POA: Diagnosis not present

## 2014-10-31 DIAGNOSIS — I6523 Occlusion and stenosis of bilateral carotid arteries: Secondary | ICD-10-CM | POA: Diagnosis not present

## 2014-10-31 DIAGNOSIS — R51 Headache: Secondary | ICD-10-CM | POA: Diagnosis not present

## 2014-10-31 DIAGNOSIS — G4489 Other headache syndrome: Secondary | ICD-10-CM

## 2014-11-26 ENCOUNTER — Telehealth: Payer: Self-pay | Admitting: Cardiology

## 2014-11-26 ENCOUNTER — Other Ambulatory Visit: Payer: Self-pay | Admitting: *Deleted

## 2014-11-26 MED ORDER — LISINOPRIL 20 MG PO TABS: ORAL_TABLET | ORAL | Status: DC

## 2014-11-26 NOTE — Telephone Encounter (Signed)
Returned patient's call about refill of Lisinopril. Did not get an answer. Refill sent to [facility]. #90, 1 RF

## 2014-11-26 NOTE — Telephone Encounter (Signed)
°  1. Which medications need to be refilled? Lisinopril please call this asap,made appt today 2.Which pharmacy is medication to be sent to?[facility]-[phone]  3. Do they need a 30 day or 90 day supply? 90 and refills  4. Would they like a call back once the medication has been sent to the pharmacy?yes

## 2014-12-20 DIAGNOSIS — R972 Elevated prostate specific antigen [PSA]: Secondary | ICD-10-CM | POA: Diagnosis not present

## 2015-01-02 ENCOUNTER — Encounter: Payer: Self-pay | Admitting: Cardiology

## 2015-02-12 DIAGNOSIS — C61 Malignant neoplasm of prostate: Secondary | ICD-10-CM | POA: Diagnosis not present

## 2015-02-12 DIAGNOSIS — R972 Elevated prostate specific antigen [PSA]: Secondary | ICD-10-CM | POA: Diagnosis not present

## 2015-03-14 ENCOUNTER — Ambulatory Visit: Payer: Medicare Other | Admitting: Cardiology

## 2015-03-19 DIAGNOSIS — C61 Malignant neoplasm of prostate: Secondary | ICD-10-CM | POA: Diagnosis not present

## 2015-03-20 ENCOUNTER — Other Ambulatory Visit: Payer: Self-pay | Admitting: Urology

## 2015-03-20 DIAGNOSIS — C61 Malignant neoplasm of prostate: Secondary | ICD-10-CM

## 2015-04-02 ENCOUNTER — Encounter (HOSPITAL_COMMUNITY): Payer: Medicare Other

## 2015-04-02 DIAGNOSIS — C61 Malignant neoplasm of prostate: Secondary | ICD-10-CM | POA: Diagnosis not present

## 2015-04-04 ENCOUNTER — Encounter: Payer: Self-pay | Admitting: Cardiology

## 2015-04-04 ENCOUNTER — Ambulatory Visit (INDEPENDENT_AMBULATORY_CARE_PROVIDER_SITE_OTHER): Payer: Medicare Other | Admitting: Cardiology

## 2015-04-04 VITALS — BP 110/70 | HR 67 | Ht 67.0 in | Wt 200.0 lb

## 2015-04-04 DIAGNOSIS — I10 Essential (primary) hypertension: Secondary | ICD-10-CM | POA: Diagnosis not present

## 2015-04-04 NOTE — Progress Notes (Signed)
HPI The patient presents for followup of hypertension and aortic stenosis which has been very mild. Since I  last saw him he has been diagnosed with prostate cancer.  Of note he did have one episode of chest discomfort. He is very vague about this. It might have been a few days ago. He said he was starting to get some discomfort and took some aspirin and went away. He's had none of this since then. He's had no new shortness of breath, PND or orthopnea. He's had no palpitations, presyncope or syncope.   Allergies  Allergen Reactions  . Acetaminophen Nausea And Vomiting    Current Outpatient Prescriptions  Medication Sig Dispense Refill  . aspirin 325 MG tablet Take 325 mg by mouth as needed.      Marland Kitchen levothyroxine (SYNTHROID, LEVOTHROID) 50 MCG tablet Take 50 mcg by mouth daily before breakfast.    . LINZESS 145 MCG CAPS capsule     . lisinopril (PRINIVIL,ZESTRIL) 20 MG tablet TAKE 1 TABLET BY MOUTH EVERY DAY 90 tablet 1  . Multiple Vitamin (MULTI-VITAMIN PO) Take by mouth daily.       No current facility-administered medications for this visit.    Past Medical History  Diagnosis Date  . History of cellulitis and abscess   . Arthritis   . Hernia     umbilical and inguinal  . Colon polyp   . Chronic diastolic heart failure     echocardiogram 12/11: EF 12-19%, grade 1 diastolic dysfunction, mild aortic stenosis with a mean gradient of 6 mm of mercury, moderate mitral annular calcification, mild LAE, trivial pericardial effusion;    Myoview 12/11 (during admission for heart failure): No ischemia or scar, EF 63%.  . Aortic stenosis   . HTN (hypertension)   . Depression   . History of alcohol abuse   . Slurred speech   . Difficulty swallowing   . Gait abnormality   . Prostate cancer     Past Surgical History  Procedure Laterality Date  . Hernia repair      umbilical and inguinal- rt  . Colon surgery  05/04/11    Lap assisted right colectomy    ROS:  As stated in the HPI and  negative for all other systems.  PHYSICAL EXAM BP 110/70 mmHg  Pulse 67  Ht 5\' 7"  (1.702 m)  Wt 200 lb (90.719 kg)  BMI 31.32 kg/m2 GENERAL:  Well appearing NECK:  No jugular venous distention, waveform within normal limits, carotid upstroke brisk and symmetric, no bruits, no thyromegaly LUNGS:  Clear to auscultation bilaterally CHEST:  Unremarkable HEART:  PMI not displaced or sustained,S1 and S2 within normal limits, no S3, no S4, no clicks, no rubs, no murmurs ABD:  Flat, positive bowel sounds normal in frequency in pitch, no bruits, no rebound, no guarding, no midline pulsatile mass, no hepatomegaly, no splenomegaly EXT:  2 plus pulses throughout, no edema, no cyanosis no clubbing  EKG:  Sinus rhythm, rate 67, axis within normal limits, intervals within normal limits, nonspecific T-wave flattening. 04/04/2015  ASSESSMENT AND PLAN  DIASTOLIC HF:  He seems to be euvolemic.  At this point, no change in therapy is indicated.  We have reviewed salt and fluid restrictions.  No further cardiovascular testing is indicated.  Idiscouraged him from taking a urine lisinopril.  AS:  This has been mild no change in therapy or further imaging is indicated at this point.  HTN:  The patient's blood pressure is well controlled.  CHEST PAIN:  He had one atypical episode of discomfort and otherwise has been asymptomatic. I don't think further cardiac testing is indicated.

## 2015-04-04 NOTE — Patient Instructions (Signed)
Your physician wants you to follow-up in: 1 Year. You will receive a reminder letter in the mail two months in advance. If you don't receive a letter, please call our office to schedule the follow-up appointment.  

## 2015-04-14 ENCOUNTER — Ambulatory Visit
Admission: RE | Admit: 2015-04-14 | Discharge: 2015-04-14 | Disposition: A | Discharge status: Home / Self Care | Payer: Medicare Other | Source: Ambulatory Visit | Attending: Radiation Oncology | Admitting: Radiation Oncology

## 2015-04-14 ENCOUNTER — Encounter: Payer: Self-pay | Admitting: Radiation Oncology

## 2015-04-14 VITALS — BP 128/66 | HR 73 | Resp 16 | Ht 69.0 in | Wt 197.6 lb

## 2015-04-14 DIAGNOSIS — F329 Major depressive disorder, single episode, unspecified: Secondary | ICD-10-CM | POA: Insufficient documentation

## 2015-04-14 DIAGNOSIS — Z510 Encounter for antineoplastic radiation therapy: Secondary | ICD-10-CM | POA: Insufficient documentation

## 2015-04-14 DIAGNOSIS — C61 Malignant neoplasm of prostate: Secondary | ICD-10-CM | POA: Diagnosis not present

## 2015-04-14 DIAGNOSIS — I5032 Chronic diastolic (congestive) heart failure: Secondary | ICD-10-CM | POA: Diagnosis not present

## 2015-04-14 DIAGNOSIS — F1010 Alcohol abuse, uncomplicated: Secondary | ICD-10-CM | POA: Insufficient documentation

## 2015-04-14 DIAGNOSIS — Z87891 Personal history of nicotine dependence: Secondary | ICD-10-CM | POA: Insufficient documentation

## 2015-04-14 DIAGNOSIS — K635 Polyp of colon: Secondary | ICD-10-CM | POA: Diagnosis not present

## 2015-04-14 DIAGNOSIS — Z809 Family history of malignant neoplasm, unspecified: Secondary | ICD-10-CM | POA: Insufficient documentation

## 2015-04-14 DIAGNOSIS — I10 Essential (primary) hypertension: Secondary | ICD-10-CM | POA: Insufficient documentation

## 2015-04-14 DIAGNOSIS — L0390 Cellulitis, unspecified: Secondary | ICD-10-CM | POA: Insufficient documentation

## 2015-04-14 HISTORY — DX: Disorder of thyroid, unspecified: E07.9

## 2015-04-14 NOTE — Progress Notes (Signed)
Radiation Oncology         [phone] ________________________________  Initial Outpatient Consultation  Name: QUANAH MAJKA MRN: [id number]  Date: 04/14/2015  DOB: 1946-02-21  CC:  Jani Gravel, MD  Franchot Gallo, MD   REFERRING PHYSICIAN: Franchot Gallo, MD  DIAGNOSIS: 69 y.o. gentleman with stage T1c adenocarcinoma of the prostate with a Gleason's score of 4+3 and a PSA of 9.24    ICD-9-CM ICD-10-CM   1. Malignant neoplasm of prostate ([place]) [place] Treese is a 69 y.o. gentleman.  He was noted to have an elevated PSA of 9.1 on 10/10/14 by his primary care physician, Dr. Maudie Mercury.  Accordingly, he was referred for evaluation in urology by Dr. Diona Fanti on 12/20/14,  digital rectal examination was performed at that time revealing a 30g gland with no nodules. PSA was repeated at that visit and was found to be 9.24.  The patient proceeded to transrectal ultrasound with 12 biopsies of the prostate on 02/12/15.  The prostate volume measured 26.98 cc.  Out of 12 core biopsies, 4 were positive.  The maximum Gleason score was 4+3, and this was seen in 50% of the left apex, while 3+4 was seen in 20% of the right apex, and 3+3 was seen in 5% of the right lateral apex and left lateral mid areas.  The patient reviewed the biopsy results with his urologist and he has kindly been referred today for discussion of potential radiation treatment options.    PREVIOUS RADIATION THERAPY: No  PAST MEDICAL HISTORY:  has a past medical history of History of cellulitis and abscess; Arthritis; Hernia; Colon polyp; Chronic diastolic heart failure ([place]); Aortic stenosis; HTN (hypertension); Depression; History of alcohol abuse; Slurred speech; Difficulty swallowing; Gait abnormality; Prostate cancer ([place]); and Thyroid disease.    PAST SURGICAL HISTORY: Past Surgical History  Procedure Laterality Date  . Hernia repair      umbilical and inguinal- rt  . Colon surgery   05/04/11    Lap assisted right colectomy  . Prostate biopsy    . Root canal      FAMILY HISTORY: family history includes Cancer in his paternal aunt and paternal uncle; Heart disease in his father.  SOCIAL HISTORY:  reports that he quit smoking about 50 years ago. He has never used smokeless tobacco. He reports that he does not drink alcohol or use illicit drugs.  ALLERGIES: Acetaminophen  MEDICATIONS:  Current Outpatient Prescriptions  Medication Sig Dispense Refill  . aspirin 325 MG tablet Take 325 mg by mouth as needed.      Marland Kitchen co-enzyme Q-10 30 MG capsule Take 30 mg by mouth 3 (three) times daily.    Marland Kitchen levothyroxine (SYNTHROID, LEVOTHROID) 50 MCG tablet Take 50 mcg by mouth daily before breakfast.    . LINZESS 145 MCG CAPS capsule     . lisinopril (PRINIVIL,ZESTRIL) 20 MG tablet TAKE 1 TABLET BY MOUTH EVERY DAY 90 tablet 1  . Multiple Vitamin (MULTI-VITAMIN PO) Take by mouth daily.       No current facility-administered medications for this encounter.    REVIEW OF SYSTEMS:  A 15 point review of systems is documented in the electronic medical record. This was obtained by the nursing staff. However, I reviewed this with the patient to discuss relevant findings and make appropriate changes.  Pertinent items noted in HPI and remainder of comprehensive ROS otherwise negative..  The patient completed an IPSS and IIEF questionnaire.  His  IPSS score was 19 indicating severe urinary outflow obstructive symptoms.  He indicated that his erectile function is not applicable.   Reports urinary urgency, nocturia x 2-3, and ED. Reports dysuria resolved shortly after biopsy. Denies incontinence or leakage. Reports constipation, currently managed with stool softener. He had 20 cm of colon removed. He had a double hernia surgery. He has pain related to the effects of arthritis. Notes previous periods where severe constipation affected his ability to urinate. He is due for a colonoscopy. Ambulates with  the use of a cane.   PHYSICAL EXAM: This patient is in no acute distress.  He is alert and oriented.   height is 5\' 9"  (1.753 m) and weight is 197 lb 9.6 oz (89.631 kg). His blood pressure is 128/66 and his pulse is 73. His respiration is 16 and oxygen saturation is 99%.  He exhibits no respiratory distress or labored breathing.  He appears neurologically intact.  His mood is pleasant.  His affect is appropriate.  Please note the digital rectal exam findings described above.  KPS = 100  100 - Normal; no complaints; no evidence of disease. 90   - Able to carry on normal activity; minor signs or symptoms of disease. 80   - Normal activity with effort; some signs or symptoms of disease. 46   - Cares for self; unable to carry on normal activity or to do active work. 60   - Requires occasional assistance, but is able to care for most of his personal needs. 50   - Requires considerable assistance and frequent medical care. 22   - Disabled; requires special care and assistance. 61   - Severely disabled; hospital admission is indicated although death not imminent. 19   - Very sick; hospital admission necessary; active supportive treatment necessary. 10   - Moribund; fatal processes progressing rapidly. 0     - Dead  Karnofsky DA, Abelmann [place], Craver LS and Burchenal [facility] [phone]) The use of the nitrogen mustards in the palliative treatment of carcinoma: with particular reference to bronchogenic carcinoma Cancer 1 634-56   LABORATORY DATA:  Lab Results  Component Value Date   WBC 8.4 07/29/2014   HGB 13.0 07/29/2014   HCT 37.7 07/29/2014   MCV 90 07/29/2014   PLT 255 07/29/2014   Lab Results  Component Value Date   NA 133* 07/29/2014   K 4.4 07/29/2014   CL 92* 07/29/2014   CO2 24 07/29/2014   Lab Results  Component Value Date   ALT 22 07/29/2014   AST 74* 07/29/2014   ALKPHOS 52 07/29/2014   BILITOT 0.5 07/29/2014     RADIOGRAPHY: No results found.    IMPRESSION: This gentleman is  a stage T1c adenocarcinoma of the prostate with a Gleason's score of 4+3 and a PSA of 9.24.  His T-Stage, Gleason's Score, and PSA put him into the intermediate risk group.  Accordingly he is eligible for a variety of potential treatment options including external radiation with or without seed boost.  PLAN: Today, I talked to the patient and family about the findings and work-up thus far.  We discussed the natural history of prostatic adenocarcinoma and general treatment, highlighting the role of radiotherapy in the management.  We discussed the available radiation techniques, and focused on the details of logistics and delivery.  We reviewed the anticipated acute and late sequelae associated with radiation in this setting.  The patient was encouraged to ask questions that I answered to the best of  my ability.  I filled out a patient counseling form during our discussion including treatment diagrams.  We retained a copy for our records.  The patient would like to proceed with radiation and will be scheduled for CT simulation.  The patient is due for a colonoscopy and will try to schedule one in the next month, if feasible before radiation. If not, colonoscopy will be scheduled 6 months after radiation treatment.   I spent 60 minutes minutes face to face with the patient and more than 50% of that time was spent in counseling and/or coordination of care.   This document serves as a record of services personally performed by Tyler Pita, MD. It was created on his behalf by Arlyce Harman, a trained medical scribe. The creation of this record is based on the scribe's personal observations and the provider's statements to them. This document has been checked and approved by the attending provider.    ------------------------------------------------  Sheral Apley Tammi Klippel, M.D.

## 2015-04-14 NOTE — Progress Notes (Signed)
GU Location of Tumor / Histology: prostatic adenocarcinoma   If Prostate Cancer, Gleason Score is (4 + 3) and PSA is (9.24)  Scott Newman was referred by his PCP, Dr. Jani Gravel, to Dr. Napoleon Form for evaluation of an elevated PSA  Biopsies of prostate (if applicable) revealed:    Past/Anticipated interventions by urology, if any: prostate biopsy and referral to radiation oncology; recommending the patient consider combination radiation and androgen deprivation therapy   Past/Anticipated interventions by medical oncology, if any: no  Weight changes, if any: no  Bowel/Bladder complaints, if any: urinary urgency, nocturia x 2-3 and ED. Reports dysuria resolved shortly after biopsy.Denies incontinence or leakage. Reports constipation.   Nausea/Vomiting, if any: no  Pain issues, if any:  Related to effects of arthritis  SAFETY ISSUES:  Prior radiation? no  Pacemaker/ICD? no  Possible current pregnancy? no  Is the patient on methotrexate? no  Current Complaints / other details:  69 year old male. Family history of breast and prostate ca. Prostate volume 26.98 cc. Married. Retired. IPSS 19. Wife confirms that due to physical and emotional issue no attempts at sex have been made for years.

## 2015-04-14 NOTE — Addendum Note (Signed)
Encounter addended by: Tyler Pita, MD on: 04/14/2015  6:38 PM<BR>     Documentation filed: Arn Medal VN

## 2015-04-14 NOTE — Progress Notes (Signed)
See progress note under physician encounter. 

## 2015-04-17 ENCOUNTER — Telehealth: Payer: Self-pay | Admitting: *Deleted

## 2015-04-17 ENCOUNTER — Ambulatory Visit: Payer: Medicare Other | Admitting: Radiation Oncology

## 2015-04-17 NOTE — Telephone Encounter (Signed)
Called patient to inform of gold seed placement on 04-24-15- arrival time - 11 am @ Dr. Alan Ripper Office and his sim on 05-09-15 @ 2:30 pm @ Dr. Johny Shears Office, no answer will call later

## 2015-04-23 ENCOUNTER — Encounter: Payer: Self-pay | Admitting: *Deleted

## 2015-04-23 ENCOUNTER — Other Ambulatory Visit: Payer: Self-pay | Admitting: Gastroenterology

## 2015-04-23 DIAGNOSIS — K5730 Diverticulosis of large intestine without perforation or abscess without bleeding: Secondary | ICD-10-CM | POA: Diagnosis not present

## 2015-04-23 DIAGNOSIS — Z86010 Personal history of colonic polyps: Secondary | ICD-10-CM | POA: Diagnosis not present

## 2015-04-23 DIAGNOSIS — D125 Benign neoplasm of sigmoid colon: Secondary | ICD-10-CM | POA: Diagnosis not present

## 2015-04-23 DIAGNOSIS — D126 Benign neoplasm of colon, unspecified: Secondary | ICD-10-CM | POA: Diagnosis not present

## 2015-04-23 DIAGNOSIS — Z09 Encounter for follow-up examination after completed treatment for conditions other than malignant neoplasm: Secondary | ICD-10-CM | POA: Diagnosis not present

## 2015-04-23 NOTE — Progress Notes (Signed)
[place] Work  Clinical Social Work was referred by radiation oncology RN for assessment of psychosocial needs.  Clinical Social Worker attempted to contact patient by phone to offer support and assess for needs.  CSW left voicemail requesting patient return call when possible.   Polo Riley, MSW, LCSW, OSW-C Clinical Social Worker [facility] [phone]

## 2015-04-24 DIAGNOSIS — C61 Malignant neoplasm of prostate: Secondary | ICD-10-CM | POA: Diagnosis not present

## 2015-05-09 ENCOUNTER — Ambulatory Visit: Admission: RE | Admit: 2015-05-09 | Payer: Medicare Other | Source: Ambulatory Visit | Admitting: Radiation Oncology

## 2015-05-13 ENCOUNTER — Other Ambulatory Visit: Payer: Self-pay | Admitting: Cardiology

## 2015-05-15 ENCOUNTER — Ambulatory Visit
Admission: RE | Admit: 2015-05-15 | Discharge: 2015-05-15 | Disposition: A | Discharge status: Home / Self Care | Payer: Medicare Other | Source: Ambulatory Visit | Attending: Radiation Oncology | Admitting: Radiation Oncology

## 2015-05-15 DIAGNOSIS — L0390 Cellulitis, unspecified: Secondary | ICD-10-CM | POA: Diagnosis not present

## 2015-05-15 DIAGNOSIS — I5032 Chronic diastolic (congestive) heart failure: Secondary | ICD-10-CM | POA: Diagnosis not present

## 2015-05-15 DIAGNOSIS — F1010 Alcohol abuse, uncomplicated: Secondary | ICD-10-CM | POA: Diagnosis not present

## 2015-05-15 DIAGNOSIS — I10 Essential (primary) hypertension: Secondary | ICD-10-CM | POA: Diagnosis not present

## 2015-05-15 DIAGNOSIS — C61 Malignant neoplasm of prostate: Secondary | ICD-10-CM

## 2015-05-15 DIAGNOSIS — Z87891 Personal history of nicotine dependence: Secondary | ICD-10-CM | POA: Diagnosis not present

## 2015-05-15 DIAGNOSIS — F329 Major depressive disorder, single episode, unspecified: Secondary | ICD-10-CM | POA: Diagnosis not present

## 2015-05-15 DIAGNOSIS — K635 Polyp of colon: Secondary | ICD-10-CM | POA: Diagnosis not present

## 2015-05-15 DIAGNOSIS — Z510 Encounter for antineoplastic radiation therapy: Secondary | ICD-10-CM | POA: Diagnosis not present

## 2015-05-15 DIAGNOSIS — Z809 Family history of malignant neoplasm, unspecified: Secondary | ICD-10-CM | POA: Diagnosis not present

## 2015-05-15 NOTE — Progress Notes (Signed)
  Radiation Oncology         ([phone]) [phone] ________________________________  Name: Scott Newman MRN: [id number]  Date: 05/15/2015  DOB: 06-27-1946  SIMULATION AND TREATMENT PLANNING NOTE    ICD-9-CM ICD-10-CM   1. Malignant neoplasm of prostate ([place]) 185 C61     DIAGNOSIS:  69 y.o. gentleman with stage T1c adenocarcinoma of the prostate with a Gleason's score of 4+3 and a PSA of 9.24.   NARRATIVE:  The patient was brought to the [facility].  Identity was confirmed.  All relevant records and images related to the planned course of therapy were reviewed.  The patient freely provided informed written consent to proceed with treatment after reviewing the details related to the planned course of therapy. The consent form was witnessed and verified by the simulation staff.  Then, the patient was set-up in a stable reproducible supine position for radiation therapy.  A vacuumlock pillow device was custom fabricated to position his legs in a reproducible immobilized position.  Then, I performed a urethrogram under sterile conditions to identify the prostatic apex.  CT images were obtained.  Surface markings were placed.  The CT images were loaded into the planning software.  Then the prostate target and avoidance structures including the rectum, bladder, bowel and hips were contoured.  Treatment planning then occurred.  The radiation prescription was entered and confirmed.  A total of 1 complex treatment devices were fabricated. I have requested : Intensity Modulated Radiotherapy (IMRT) is medically necessary for this case for the following reason:  Rectal sparing.Marland Kitchen  PLAN:  The patient will receive 78 Gy in 40 fractions.  ________________________________  Sheral Apley Tammi Klippel, M.D.   This document serves as a record of services personally performed by Tyler Pita, MD. It was created on his behalf by Lendon Collar, a trained medical scribe. The creation of this record is based  on the scribe's personal observations and the provider's statements to them. This document has been checked and approved by the attending provider.

## 2015-05-22 DIAGNOSIS — I10 Essential (primary) hypertension: Secondary | ICD-10-CM | POA: Diagnosis not present

## 2015-05-22 DIAGNOSIS — L0390 Cellulitis, unspecified: Secondary | ICD-10-CM | POA: Diagnosis not present

## 2015-05-22 DIAGNOSIS — F329 Major depressive disorder, single episode, unspecified: Secondary | ICD-10-CM | POA: Diagnosis not present

## 2015-05-22 DIAGNOSIS — Z87891 Personal history of nicotine dependence: Secondary | ICD-10-CM | POA: Diagnosis not present

## 2015-05-22 DIAGNOSIS — C61 Malignant neoplasm of prostate: Secondary | ICD-10-CM | POA: Diagnosis not present

## 2015-05-22 DIAGNOSIS — F1010 Alcohol abuse, uncomplicated: Secondary | ICD-10-CM | POA: Diagnosis not present

## 2015-05-22 DIAGNOSIS — Z510 Encounter for antineoplastic radiation therapy: Secondary | ICD-10-CM | POA: Diagnosis not present

## 2015-05-22 DIAGNOSIS — Z809 Family history of malignant neoplasm, unspecified: Secondary | ICD-10-CM | POA: Diagnosis not present

## 2015-05-22 DIAGNOSIS — I5032 Chronic diastolic (congestive) heart failure: Secondary | ICD-10-CM | POA: Diagnosis not present

## 2015-05-22 DIAGNOSIS — K635 Polyp of colon: Secondary | ICD-10-CM | POA: Diagnosis not present

## 2015-05-23 DIAGNOSIS — I10 Essential (primary) hypertension: Secondary | ICD-10-CM | POA: Diagnosis not present

## 2015-05-23 DIAGNOSIS — Z510 Encounter for antineoplastic radiation therapy: Secondary | ICD-10-CM | POA: Diagnosis not present

## 2015-05-23 DIAGNOSIS — I5032 Chronic diastolic (congestive) heart failure: Secondary | ICD-10-CM | POA: Diagnosis not present

## 2015-05-23 DIAGNOSIS — E039 Hypothyroidism, unspecified: Secondary | ICD-10-CM | POA: Diagnosis not present

## 2015-05-23 DIAGNOSIS — Z87891 Personal history of nicotine dependence: Secondary | ICD-10-CM | POA: Diagnosis not present

## 2015-05-23 DIAGNOSIS — K635 Polyp of colon: Secondary | ICD-10-CM | POA: Diagnosis not present

## 2015-05-23 DIAGNOSIS — F1010 Alcohol abuse, uncomplicated: Secondary | ICD-10-CM | POA: Diagnosis not present

## 2015-05-23 DIAGNOSIS — F329 Major depressive disorder, single episode, unspecified: Secondary | ICD-10-CM | POA: Diagnosis not present

## 2015-05-23 DIAGNOSIS — L0390 Cellulitis, unspecified: Secondary | ICD-10-CM | POA: Diagnosis not present

## 2015-05-23 DIAGNOSIS — C61 Malignant neoplasm of prostate: Secondary | ICD-10-CM | POA: Diagnosis not present

## 2015-05-23 DIAGNOSIS — Z809 Family history of malignant neoplasm, unspecified: Secondary | ICD-10-CM | POA: Diagnosis not present

## 2015-05-26 ENCOUNTER — Ambulatory Visit
Admission: RE | Admit: 2015-05-26 | Discharge: 2015-05-26 | Disposition: A | Discharge status: Home / Self Care | Payer: Medicare Other | Source: Ambulatory Visit | Attending: Radiation Oncology | Admitting: Radiation Oncology

## 2015-05-26 DIAGNOSIS — I5032 Chronic diastolic (congestive) heart failure: Secondary | ICD-10-CM | POA: Diagnosis not present

## 2015-05-26 DIAGNOSIS — Z87891 Personal history of nicotine dependence: Secondary | ICD-10-CM | POA: Diagnosis not present

## 2015-05-26 DIAGNOSIS — L0390 Cellulitis, unspecified: Secondary | ICD-10-CM | POA: Diagnosis not present

## 2015-05-26 DIAGNOSIS — C61 Malignant neoplasm of prostate: Secondary | ICD-10-CM | POA: Diagnosis not present

## 2015-05-26 DIAGNOSIS — F1010 Alcohol abuse, uncomplicated: Secondary | ICD-10-CM | POA: Diagnosis not present

## 2015-05-26 DIAGNOSIS — K635 Polyp of colon: Secondary | ICD-10-CM | POA: Diagnosis not present

## 2015-05-26 DIAGNOSIS — Z809 Family history of malignant neoplasm, unspecified: Secondary | ICD-10-CM | POA: Diagnosis not present

## 2015-05-26 DIAGNOSIS — I10 Essential (primary) hypertension: Secondary | ICD-10-CM | POA: Diagnosis not present

## 2015-05-26 DIAGNOSIS — F329 Major depressive disorder, single episode, unspecified: Secondary | ICD-10-CM | POA: Diagnosis not present

## 2015-05-26 DIAGNOSIS — Z510 Encounter for antineoplastic radiation therapy: Secondary | ICD-10-CM | POA: Diagnosis not present

## 2015-05-27 ENCOUNTER — Ambulatory Visit
Admission: RE | Admit: 2015-05-27 | Discharge: 2015-05-27 | Disposition: A | Discharge status: Home / Self Care | Payer: Medicare Other | Source: Ambulatory Visit | Attending: Radiation Oncology | Admitting: Radiation Oncology

## 2015-05-27 DIAGNOSIS — Z510 Encounter for antineoplastic radiation therapy: Secondary | ICD-10-CM | POA: Diagnosis not present

## 2015-05-27 DIAGNOSIS — Z87891 Personal history of nicotine dependence: Secondary | ICD-10-CM | POA: Diagnosis not present

## 2015-05-27 DIAGNOSIS — K635 Polyp of colon: Secondary | ICD-10-CM | POA: Diagnosis not present

## 2015-05-27 DIAGNOSIS — Z809 Family history of malignant neoplasm, unspecified: Secondary | ICD-10-CM | POA: Diagnosis not present

## 2015-05-27 DIAGNOSIS — F1010 Alcohol abuse, uncomplicated: Secondary | ICD-10-CM | POA: Diagnosis not present

## 2015-05-27 DIAGNOSIS — L0390 Cellulitis, unspecified: Secondary | ICD-10-CM | POA: Diagnosis not present

## 2015-05-27 DIAGNOSIS — F329 Major depressive disorder, single episode, unspecified: Secondary | ICD-10-CM | POA: Diagnosis not present

## 2015-05-27 DIAGNOSIS — C61 Malignant neoplasm of prostate: Secondary | ICD-10-CM | POA: Diagnosis not present

## 2015-05-27 DIAGNOSIS — I5032 Chronic diastolic (congestive) heart failure: Secondary | ICD-10-CM | POA: Diagnosis not present

## 2015-05-27 DIAGNOSIS — I10 Essential (primary) hypertension: Secondary | ICD-10-CM | POA: Diagnosis not present

## 2015-05-28 ENCOUNTER — Ambulatory Visit
Admission: RE | Admit: 2015-05-28 | Discharge: 2015-05-28 | Disposition: A | Discharge status: Home / Self Care | Payer: Medicare Other | Source: Ambulatory Visit | Attending: Radiation Oncology | Admitting: Radiation Oncology

## 2015-05-28 DIAGNOSIS — I10 Essential (primary) hypertension: Secondary | ICD-10-CM | POA: Diagnosis not present

## 2015-05-28 DIAGNOSIS — L0390 Cellulitis, unspecified: Secondary | ICD-10-CM | POA: Diagnosis not present

## 2015-05-28 DIAGNOSIS — Z809 Family history of malignant neoplasm, unspecified: Secondary | ICD-10-CM | POA: Diagnosis not present

## 2015-05-28 DIAGNOSIS — C61 Malignant neoplasm of prostate: Secondary | ICD-10-CM | POA: Diagnosis not present

## 2015-05-28 DIAGNOSIS — K635 Polyp of colon: Secondary | ICD-10-CM | POA: Diagnosis not present

## 2015-05-28 DIAGNOSIS — I5032 Chronic diastolic (congestive) heart failure: Secondary | ICD-10-CM | POA: Diagnosis not present

## 2015-05-28 DIAGNOSIS — Z510 Encounter for antineoplastic radiation therapy: Secondary | ICD-10-CM | POA: Diagnosis not present

## 2015-05-28 DIAGNOSIS — F329 Major depressive disorder, single episode, unspecified: Secondary | ICD-10-CM | POA: Diagnosis not present

## 2015-05-28 DIAGNOSIS — Z87891 Personal history of nicotine dependence: Secondary | ICD-10-CM | POA: Diagnosis not present

## 2015-05-28 DIAGNOSIS — F1010 Alcohol abuse, uncomplicated: Secondary | ICD-10-CM | POA: Diagnosis not present

## 2015-05-29 ENCOUNTER — Ambulatory Visit
Admission: RE | Admit: 2015-05-29 | Discharge: 2015-05-29 | Disposition: A | Discharge status: Home / Self Care | Payer: Medicare Other | Source: Ambulatory Visit | Attending: Radiation Oncology | Admitting: Radiation Oncology

## 2015-05-29 DIAGNOSIS — I10 Essential (primary) hypertension: Secondary | ICD-10-CM | POA: Diagnosis not present

## 2015-05-29 DIAGNOSIS — Z87891 Personal history of nicotine dependence: Secondary | ICD-10-CM | POA: Diagnosis not present

## 2015-05-29 DIAGNOSIS — F329 Major depressive disorder, single episode, unspecified: Secondary | ICD-10-CM | POA: Diagnosis not present

## 2015-05-29 DIAGNOSIS — Z510 Encounter for antineoplastic radiation therapy: Secondary | ICD-10-CM | POA: Diagnosis not present

## 2015-05-29 DIAGNOSIS — L0390 Cellulitis, unspecified: Secondary | ICD-10-CM | POA: Diagnosis not present

## 2015-05-29 DIAGNOSIS — F1010 Alcohol abuse, uncomplicated: Secondary | ICD-10-CM | POA: Diagnosis not present

## 2015-05-29 DIAGNOSIS — C61 Malignant neoplasm of prostate: Secondary | ICD-10-CM | POA: Diagnosis not present

## 2015-05-29 DIAGNOSIS — I5032 Chronic diastolic (congestive) heart failure: Secondary | ICD-10-CM | POA: Diagnosis not present

## 2015-05-29 DIAGNOSIS — Z809 Family history of malignant neoplasm, unspecified: Secondary | ICD-10-CM | POA: Diagnosis not present

## 2015-05-29 DIAGNOSIS — K635 Polyp of colon: Secondary | ICD-10-CM | POA: Diagnosis not present

## 2015-05-30 ENCOUNTER — Ambulatory Visit
Admission: RE | Admit: 2015-05-30 | Discharge: 2015-05-30 | Disposition: A | Discharge status: Home / Self Care | Payer: Medicare Other | Source: Ambulatory Visit | Attending: Radiation Oncology | Admitting: Radiation Oncology

## 2015-05-30 ENCOUNTER — Encounter: Payer: Self-pay | Admitting: Radiation Oncology

## 2015-05-30 VITALS — BP 131/58 | HR 62 | Temp 97.8°F | Resp 20 | Wt 192.9 lb

## 2015-05-30 DIAGNOSIS — I10 Essential (primary) hypertension: Secondary | ICD-10-CM | POA: Diagnosis not present

## 2015-05-30 DIAGNOSIS — L0390 Cellulitis, unspecified: Secondary | ICD-10-CM | POA: Diagnosis not present

## 2015-05-30 DIAGNOSIS — Z510 Encounter for antineoplastic radiation therapy: Secondary | ICD-10-CM | POA: Diagnosis not present

## 2015-05-30 DIAGNOSIS — Z809 Family history of malignant neoplasm, unspecified: Secondary | ICD-10-CM | POA: Diagnosis not present

## 2015-05-30 DIAGNOSIS — I5032 Chronic diastolic (congestive) heart failure: Secondary | ICD-10-CM | POA: Diagnosis not present

## 2015-05-30 DIAGNOSIS — C61 Malignant neoplasm of prostate: Secondary | ICD-10-CM | POA: Diagnosis not present

## 2015-05-30 DIAGNOSIS — K635 Polyp of colon: Secondary | ICD-10-CM | POA: Diagnosis not present

## 2015-05-30 DIAGNOSIS — Z87891 Personal history of nicotine dependence: Secondary | ICD-10-CM | POA: Diagnosis not present

## 2015-05-30 DIAGNOSIS — F1010 Alcohol abuse, uncomplicated: Secondary | ICD-10-CM | POA: Diagnosis not present

## 2015-05-30 DIAGNOSIS — F329 Major depressive disorder, single episode, unspecified: Secondary | ICD-10-CM | POA: Diagnosis not present

## 2015-05-30 NOTE — Progress Notes (Signed)
Pt weekly rad txs Prostate 5/40 completed, has slow stream, no hematuiria, takes linzess for his bowels, nopain , patient education done, radiation therapy and yopu bnook gioven,discussed ways to manage side effecs fatigue, skin iritation, urinary/bladder changes, verbal understanding, teach back given, fair appetite, and mild fatigue, waklks with a cane,  1:03 PM  1:03 PM BP 131/58 mmHg  Pulse 62  Temp(Src) 97.8 F (36.6 C) (Oral)  Resp 20  Wt 192 lb 14.4 oz (87.499 kg)  Wt Readings from Last 3 Encounters:  05/30/15 192 lb 14.4 oz (87.499 kg)  04/14/15 197 lb 9.6 oz (89.631 kg)  04/04/15 200 lb (90.719 kg)

## 2015-05-30 NOTE — Progress Notes (Signed)
  Radiation Oncology         [phone]   Name: Scott Newman MRN: [id number]   Date: 05/30/2015  DOB: Apr 30, 1946   Weekly Radiation Therapy Management    ICD-9-CM ICD-10-CM   1. Malignant neoplasm of prostate ([facility]) 185 C61     Current Dose: 9.75 Gy  Planned Dose:  78 Gy  Narrative The patient presents for routine under treatment assessment.  The patient is without complaint. Set-up films were reviewed. The chart was checked.  Physical Findings  weight is 192 lb 14.4 oz (87.499 kg). His oral temperature is 97.8 F (36.6 C). His blood pressure is 131/58 and his pulse is 62. His respiration is 20. . Weight essentially stable.  No significant changes.  Impression The patient is tolerating radiation.  Plan Continue treatment as planned.         Sheral Apley Tammi Klippel, M.D.

## 2015-06-02 ENCOUNTER — Ambulatory Visit
Admission: RE | Admit: 2015-06-02 | Discharge: 2015-06-02 | Disposition: A | Discharge status: Home / Self Care | Payer: Medicare Other | Source: Ambulatory Visit | Attending: Radiation Oncology | Admitting: Radiation Oncology

## 2015-06-02 DIAGNOSIS — Z87891 Personal history of nicotine dependence: Secondary | ICD-10-CM | POA: Diagnosis not present

## 2015-06-02 DIAGNOSIS — I10 Essential (primary) hypertension: Secondary | ICD-10-CM | POA: Diagnosis not present

## 2015-06-02 DIAGNOSIS — Z510 Encounter for antineoplastic radiation therapy: Secondary | ICD-10-CM | POA: Diagnosis not present

## 2015-06-02 DIAGNOSIS — Z809 Family history of malignant neoplasm, unspecified: Secondary | ICD-10-CM | POA: Diagnosis not present

## 2015-06-02 DIAGNOSIS — K635 Polyp of colon: Secondary | ICD-10-CM | POA: Diagnosis not present

## 2015-06-02 DIAGNOSIS — F329 Major depressive disorder, single episode, unspecified: Secondary | ICD-10-CM | POA: Diagnosis not present

## 2015-06-02 DIAGNOSIS — C61 Malignant neoplasm of prostate: Secondary | ICD-10-CM | POA: Diagnosis not present

## 2015-06-02 DIAGNOSIS — I5032 Chronic diastolic (congestive) heart failure: Secondary | ICD-10-CM | POA: Diagnosis not present

## 2015-06-02 DIAGNOSIS — F1010 Alcohol abuse, uncomplicated: Secondary | ICD-10-CM | POA: Diagnosis not present

## 2015-06-02 DIAGNOSIS — L0390 Cellulitis, unspecified: Secondary | ICD-10-CM | POA: Diagnosis not present

## 2015-06-03 ENCOUNTER — Encounter: Payer: Self-pay | Admitting: Radiation Oncology

## 2015-06-03 ENCOUNTER — Ambulatory Visit
Admission: RE | Admit: 2015-06-03 | Discharge: 2015-06-03 | Disposition: A | Discharge status: Home / Self Care | Payer: Medicare Other | Source: Ambulatory Visit | Attending: Radiation Oncology | Admitting: Radiation Oncology

## 2015-06-03 VITALS — BP 106/54 | HR 79 | Temp 97.9°F | Resp 20 | Wt 191.5 lb

## 2015-06-03 DIAGNOSIS — C61 Malignant neoplasm of prostate: Secondary | ICD-10-CM | POA: Diagnosis not present

## 2015-06-03 DIAGNOSIS — F1010 Alcohol abuse, uncomplicated: Secondary | ICD-10-CM | POA: Diagnosis not present

## 2015-06-03 DIAGNOSIS — L0390 Cellulitis, unspecified: Secondary | ICD-10-CM | POA: Diagnosis not present

## 2015-06-03 DIAGNOSIS — K635 Polyp of colon: Secondary | ICD-10-CM | POA: Diagnosis not present

## 2015-06-03 DIAGNOSIS — I5032 Chronic diastolic (congestive) heart failure: Secondary | ICD-10-CM | POA: Diagnosis not present

## 2015-06-03 DIAGNOSIS — F329 Major depressive disorder, single episode, unspecified: Secondary | ICD-10-CM | POA: Diagnosis not present

## 2015-06-03 DIAGNOSIS — Z87891 Personal history of nicotine dependence: Secondary | ICD-10-CM | POA: Diagnosis not present

## 2015-06-03 DIAGNOSIS — Z510 Encounter for antineoplastic radiation therapy: Secondary | ICD-10-CM | POA: Diagnosis not present

## 2015-06-03 DIAGNOSIS — I10 Essential (primary) hypertension: Secondary | ICD-10-CM | POA: Diagnosis not present

## 2015-06-03 DIAGNOSIS — Z809 Family history of malignant neoplasm, unspecified: Secondary | ICD-10-CM | POA: Diagnosis not present

## 2015-06-03 NOTE — Progress Notes (Signed)
   Department of Radiation Oncology  Phone:  [phone] Fax:        [phone]  Weekly Treatment Note    Name: Scott Newman Date: 06/03/2015 MRN: [id number] DOB: 14-Oct-1945   Current dose: 13.65 Gy  Current fraction: 7   MEDICATIONS: Current Outpatient Prescriptions  Medication Sig Dispense Refill  . aspirin 325 MG tablet Take 325 mg by mouth as needed.      Marland Kitchen co-enzyme Q-10 30 MG capsule Take 30 mg by mouth daily.     Marland Kitchen levothyroxine (SYNTHROID, LEVOTHROID) 50 MCG tablet Take 50 mcg by mouth daily before breakfast.    . LINZESS 145 MCG CAPS capsule Take 145 mcg by mouth every other day.     . lisinopril (PRINIVIL,ZESTRIL) 20 MG tablet TAKE 1 TABLET BY MOUTH EVERY DAY 90 tablet 2  . Multiple Vitamin (MULTI-VITAMIN PO) Take by mouth daily.       No current facility-administered medications for this encounter.     ALLERGIES: Acetaminophen   LABORATORY DATA:  Lab Results  Component Value Date   WBC 8.4 07/29/2014   HGB 13.0 07/29/2014   HCT 37.7 07/29/2014   MCV 90 07/29/2014   PLT 255 07/29/2014   Lab Results  Component Value Date   NA 133* 07/29/2014   K 4.4 07/29/2014   CL 92* 07/29/2014   CO2 24 07/29/2014   Lab Results  Component Value Date   ALT 22 07/29/2014   AST 74* 07/29/2014   ALKPHOS 52 07/29/2014   BILITOT 0.5 07/29/2014     NARRATIVE: Scott Newman was seen today for weekly treatment management. The chart was checked and the patient's films were reviewed.  Weekly rad txs prostate 7/40 completed, no nocturia no hematuria, has some urgency, appetite good, energy level  Mild tired , bowels regular now 5:41 PM BP 106/54 mmHg  Pulse 79  Temp(Src) 97.9 F (36.6 C) (Oral)  Resp 20  Wt 191 lb 8 oz (86.864 kg)  Wt Readings from Last 3 Encounters:  06/03/15 191 lb 8 oz (86.864 kg)  05/30/15 192 lb 14.4 oz (87.499 kg)  04/14/15 197 lb 9.6 oz (89.631 kg)    PHYSICAL EXAMINATION: weight is 191 lb 8 oz (86.864 kg). His oral  temperature is 97.9 F (36.6 C). His blood pressure is 106/54 and his pulse is 79. His respiration is 20.        ASSESSMENT: The patient is doing satisfactorily with treatment.  PLAN: We will continue with the patient's radiation treatment as planned. The patient is doing excellent right now with no difficulties of far.

## 2015-06-03 NOTE — Progress Notes (Addendum)
Weekly rad txs prostate 7/40 completed, no nocturia no hematuria, has some urgency, appetite good, energy level  Mild tired , bowels regular now 4:40 PM BP 106/54 mmHg  Pulse 79  Temp(Src) 97.9 F (36.6 C) (Oral)  Resp 20  Wt 191 lb 8 oz (86.864 kg)  Wt Readings from Last 3 Encounters:  06/03/15 191 lb 8 oz (86.864 kg)  05/30/15 192 lb 14.4 oz (87.499 kg)  04/14/15 197 lb 9.6 oz (89.631 kg)

## 2015-06-04 ENCOUNTER — Ambulatory Visit
Admission: RE | Admit: 2015-06-04 | Discharge: 2015-06-04 | Disposition: A | Discharge status: Home / Self Care | Payer: Medicare Other | Source: Ambulatory Visit | Attending: Radiation Oncology | Admitting: Radiation Oncology

## 2015-06-04 DIAGNOSIS — Z510 Encounter for antineoplastic radiation therapy: Secondary | ICD-10-CM | POA: Diagnosis not present

## 2015-06-04 DIAGNOSIS — I10 Essential (primary) hypertension: Secondary | ICD-10-CM | POA: Diagnosis not present

## 2015-06-04 DIAGNOSIS — K635 Polyp of colon: Secondary | ICD-10-CM | POA: Diagnosis not present

## 2015-06-04 DIAGNOSIS — Z809 Family history of malignant neoplasm, unspecified: Secondary | ICD-10-CM | POA: Diagnosis not present

## 2015-06-04 DIAGNOSIS — I5032 Chronic diastolic (congestive) heart failure: Secondary | ICD-10-CM | POA: Diagnosis not present

## 2015-06-04 DIAGNOSIS — Z87891 Personal history of nicotine dependence: Secondary | ICD-10-CM | POA: Diagnosis not present

## 2015-06-04 DIAGNOSIS — C61 Malignant neoplasm of prostate: Secondary | ICD-10-CM | POA: Diagnosis not present

## 2015-06-04 DIAGNOSIS — F1010 Alcohol abuse, uncomplicated: Secondary | ICD-10-CM | POA: Diagnosis not present

## 2015-06-04 DIAGNOSIS — L0390 Cellulitis, unspecified: Secondary | ICD-10-CM | POA: Diagnosis not present

## 2015-06-04 DIAGNOSIS — F329 Major depressive disorder, single episode, unspecified: Secondary | ICD-10-CM | POA: Diagnosis not present

## 2015-06-09 ENCOUNTER — Ambulatory Visit
Admission: RE | Admit: 2015-06-09 | Discharge: 2015-06-09 | Disposition: A | Discharge status: Home / Self Care | Payer: Medicare Other | Source: Ambulatory Visit | Attending: Radiation Oncology | Admitting: Radiation Oncology

## 2015-06-09 DIAGNOSIS — I5032 Chronic diastolic (congestive) heart failure: Secondary | ICD-10-CM | POA: Diagnosis not present

## 2015-06-09 DIAGNOSIS — I10 Essential (primary) hypertension: Secondary | ICD-10-CM | POA: Diagnosis not present

## 2015-06-09 DIAGNOSIS — Z809 Family history of malignant neoplasm, unspecified: Secondary | ICD-10-CM | POA: Diagnosis not present

## 2015-06-09 DIAGNOSIS — K635 Polyp of colon: Secondary | ICD-10-CM | POA: Diagnosis not present

## 2015-06-09 DIAGNOSIS — Z87891 Personal history of nicotine dependence: Secondary | ICD-10-CM | POA: Diagnosis not present

## 2015-06-09 DIAGNOSIS — Z510 Encounter for antineoplastic radiation therapy: Secondary | ICD-10-CM | POA: Diagnosis not present

## 2015-06-09 DIAGNOSIS — F1010 Alcohol abuse, uncomplicated: Secondary | ICD-10-CM | POA: Diagnosis not present

## 2015-06-09 DIAGNOSIS — C61 Malignant neoplasm of prostate: Secondary | ICD-10-CM | POA: Diagnosis not present

## 2015-06-09 DIAGNOSIS — L0390 Cellulitis, unspecified: Secondary | ICD-10-CM | POA: Diagnosis not present

## 2015-06-09 DIAGNOSIS — F329 Major depressive disorder, single episode, unspecified: Secondary | ICD-10-CM | POA: Diagnosis not present

## 2015-06-10 ENCOUNTER — Ambulatory Visit
Admission: RE | Admit: 2015-06-10 | Discharge: 2015-06-10 | Disposition: A | Discharge status: Home / Self Care | Payer: Medicare Other | Source: Ambulatory Visit | Attending: Radiation Oncology | Admitting: Radiation Oncology

## 2015-06-10 DIAGNOSIS — I10 Essential (primary) hypertension: Secondary | ICD-10-CM | POA: Diagnosis not present

## 2015-06-10 DIAGNOSIS — L0390 Cellulitis, unspecified: Secondary | ICD-10-CM | POA: Diagnosis not present

## 2015-06-10 DIAGNOSIS — K635 Polyp of colon: Secondary | ICD-10-CM | POA: Diagnosis not present

## 2015-06-10 DIAGNOSIS — Z510 Encounter for antineoplastic radiation therapy: Secondary | ICD-10-CM | POA: Diagnosis not present

## 2015-06-10 DIAGNOSIS — I5032 Chronic diastolic (congestive) heart failure: Secondary | ICD-10-CM | POA: Diagnosis not present

## 2015-06-10 DIAGNOSIS — Z809 Family history of malignant neoplasm, unspecified: Secondary | ICD-10-CM | POA: Diagnosis not present

## 2015-06-10 DIAGNOSIS — C61 Malignant neoplasm of prostate: Secondary | ICD-10-CM | POA: Diagnosis not present

## 2015-06-10 DIAGNOSIS — Z87891 Personal history of nicotine dependence: Secondary | ICD-10-CM | POA: Diagnosis not present

## 2015-06-10 DIAGNOSIS — F329 Major depressive disorder, single episode, unspecified: Secondary | ICD-10-CM | POA: Diagnosis not present

## 2015-06-10 DIAGNOSIS — F1010 Alcohol abuse, uncomplicated: Secondary | ICD-10-CM | POA: Diagnosis not present

## 2015-06-11 ENCOUNTER — Ambulatory Visit
Admission: RE | Admit: 2015-06-11 | Discharge: 2015-06-11 | Disposition: A | Discharge status: Home / Self Care | Payer: Medicare Other | Source: Ambulatory Visit | Attending: Radiation Oncology | Admitting: Radiation Oncology

## 2015-06-11 DIAGNOSIS — F329 Major depressive disorder, single episode, unspecified: Secondary | ICD-10-CM | POA: Diagnosis not present

## 2015-06-11 DIAGNOSIS — C61 Malignant neoplasm of prostate: Secondary | ICD-10-CM | POA: Diagnosis not present

## 2015-06-11 DIAGNOSIS — I5032 Chronic diastolic (congestive) heart failure: Secondary | ICD-10-CM | POA: Diagnosis not present

## 2015-06-11 DIAGNOSIS — Z510 Encounter for antineoplastic radiation therapy: Secondary | ICD-10-CM | POA: Diagnosis not present

## 2015-06-11 DIAGNOSIS — F1010 Alcohol abuse, uncomplicated: Secondary | ICD-10-CM | POA: Diagnosis not present

## 2015-06-11 DIAGNOSIS — L0390 Cellulitis, unspecified: Secondary | ICD-10-CM | POA: Diagnosis not present

## 2015-06-11 DIAGNOSIS — K635 Polyp of colon: Secondary | ICD-10-CM | POA: Diagnosis not present

## 2015-06-11 DIAGNOSIS — I10 Essential (primary) hypertension: Secondary | ICD-10-CM | POA: Diagnosis not present

## 2015-06-11 DIAGNOSIS — Z809 Family history of malignant neoplasm, unspecified: Secondary | ICD-10-CM | POA: Diagnosis not present

## 2015-06-11 DIAGNOSIS — Z87891 Personal history of nicotine dependence: Secondary | ICD-10-CM | POA: Diagnosis not present

## 2015-06-12 ENCOUNTER — Ambulatory Visit
Admission: RE | Admit: 2015-06-12 | Discharge: 2015-06-12 | Disposition: A | Discharge status: Home / Self Care | Payer: Medicare Other | Source: Ambulatory Visit | Attending: Radiation Oncology | Admitting: Radiation Oncology

## 2015-06-12 DIAGNOSIS — Z87891 Personal history of nicotine dependence: Secondary | ICD-10-CM | POA: Diagnosis not present

## 2015-06-12 DIAGNOSIS — F329 Major depressive disorder, single episode, unspecified: Secondary | ICD-10-CM | POA: Diagnosis not present

## 2015-06-12 DIAGNOSIS — I10 Essential (primary) hypertension: Secondary | ICD-10-CM | POA: Diagnosis not present

## 2015-06-12 DIAGNOSIS — C61 Malignant neoplasm of prostate: Secondary | ICD-10-CM | POA: Diagnosis not present

## 2015-06-12 DIAGNOSIS — L0390 Cellulitis, unspecified: Secondary | ICD-10-CM | POA: Diagnosis not present

## 2015-06-12 DIAGNOSIS — Z809 Family history of malignant neoplasm, unspecified: Secondary | ICD-10-CM | POA: Diagnosis not present

## 2015-06-12 DIAGNOSIS — F1010 Alcohol abuse, uncomplicated: Secondary | ICD-10-CM | POA: Diagnosis not present

## 2015-06-12 DIAGNOSIS — K635 Polyp of colon: Secondary | ICD-10-CM | POA: Diagnosis not present

## 2015-06-12 DIAGNOSIS — I5032 Chronic diastolic (congestive) heart failure: Secondary | ICD-10-CM | POA: Diagnosis not present

## 2015-06-12 DIAGNOSIS — Z510 Encounter for antineoplastic radiation therapy: Secondary | ICD-10-CM | POA: Diagnosis not present

## 2015-06-13 ENCOUNTER — Ambulatory Visit
Admission: RE | Admit: 2015-06-13 | Discharge: 2015-06-13 | Disposition: A | Discharge status: Home / Self Care | Payer: Medicare Other | Source: Ambulatory Visit | Attending: Radiation Oncology | Admitting: Radiation Oncology

## 2015-06-13 ENCOUNTER — Encounter: Payer: Self-pay | Admitting: Radiation Oncology

## 2015-06-13 VITALS — BP 131/59 | HR 70 | Resp 16 | Wt 190.2 lb

## 2015-06-13 DIAGNOSIS — L0390 Cellulitis, unspecified: Secondary | ICD-10-CM | POA: Diagnosis not present

## 2015-06-13 DIAGNOSIS — I5032 Chronic diastolic (congestive) heart failure: Secondary | ICD-10-CM | POA: Diagnosis not present

## 2015-06-13 DIAGNOSIS — K635 Polyp of colon: Secondary | ICD-10-CM | POA: Diagnosis not present

## 2015-06-13 DIAGNOSIS — Z510 Encounter for antineoplastic radiation therapy: Secondary | ICD-10-CM | POA: Diagnosis not present

## 2015-06-13 DIAGNOSIS — C61 Malignant neoplasm of prostate: Secondary | ICD-10-CM

## 2015-06-13 DIAGNOSIS — F1010 Alcohol abuse, uncomplicated: Secondary | ICD-10-CM | POA: Diagnosis not present

## 2015-06-13 DIAGNOSIS — I10 Essential (primary) hypertension: Secondary | ICD-10-CM | POA: Diagnosis not present

## 2015-06-13 DIAGNOSIS — Z809 Family history of malignant neoplasm, unspecified: Secondary | ICD-10-CM | POA: Diagnosis not present

## 2015-06-13 DIAGNOSIS — Z87891 Personal history of nicotine dependence: Secondary | ICD-10-CM | POA: Diagnosis not present

## 2015-06-13 DIAGNOSIS — F329 Major depressive disorder, single episode, unspecified: Secondary | ICD-10-CM | POA: Diagnosis not present

## 2015-06-13 NOTE — Progress Notes (Signed)
  Radiation Oncology         [phone]   Name: Scott Newman MRN: [id number]   Date: 06/13/2015  DOB: 07/04/1946     Weekly Radiation Therapy Management    ICD-9-CM ICD-10-CM   1. Malignant neoplasm of prostate ([place]) 185 C61     Current Dose: 25.35 Gy  Planned Dose:  78 Gy  Narrative The patient presents for routine under treatment assessment.   Weight and vitals stable. Denies pain. Reports urinary frequency and urgency. Reports mild urinary sphincter discomfort/aching. Denies dysuria, nocturia or hematuria. Denies diarrhea. Denies fatigue.   The patient is without complaint. Set-up films were reviewed. The chart was checked.  Physical Findings  weight is 190 lb 3.2 oz (86.274 kg). His blood pressure is 131/59 and his pulse is 70. His respiration is 16 and oxygen saturation is 100%. . Weight essentially stable.  No significant changes.  Impression The patient is tolerating radiation.  Plan Continue treatment as planned.         Sheral Apley Tammi Klippel, M.D.  This document serves as a record of services personally performed by Tyler Pita, MD. It was created on his behalf by Arlyce Harman, a trained medical scribe. The creation of this record is based on the scribe's personal observations and the provider's statements to them. This document has been checked and approved by the attending provider.

## 2015-06-13 NOTE — Progress Notes (Signed)
Weight and vitals stable. Denies pain. Reports urinary frequency and urgency. Denies dysuria, nocturia or hematuria. Denies diarrhea. Denies fatigue.   BP 131/59 mmHg  Pulse 70  Resp 16  Wt 190 lb 3.2 oz (86.274 kg)  SpO2 100% Wt Readings from Last 3 Encounters:  06/13/15 190 lb 3.2 oz (86.274 kg)  06/03/15 191 lb 8 oz (86.864 kg)  05/30/15 192 lb 14.4 oz (87.499 kg)

## 2015-06-16 ENCOUNTER — Ambulatory Visit
Admission: RE | Admit: 2015-06-16 | Discharge: 2015-06-16 | Disposition: A | Discharge status: Home / Self Care | Payer: Medicare Other | Source: Ambulatory Visit | Attending: Radiation Oncology | Admitting: Radiation Oncology

## 2015-06-16 DIAGNOSIS — I5032 Chronic diastolic (congestive) heart failure: Secondary | ICD-10-CM | POA: Diagnosis not present

## 2015-06-16 DIAGNOSIS — L0390 Cellulitis, unspecified: Secondary | ICD-10-CM | POA: Diagnosis not present

## 2015-06-16 DIAGNOSIS — F329 Major depressive disorder, single episode, unspecified: Secondary | ICD-10-CM | POA: Diagnosis not present

## 2015-06-16 DIAGNOSIS — F1010 Alcohol abuse, uncomplicated: Secondary | ICD-10-CM | POA: Diagnosis not present

## 2015-06-16 DIAGNOSIS — C61 Malignant neoplasm of prostate: Secondary | ICD-10-CM | POA: Diagnosis not present

## 2015-06-16 DIAGNOSIS — K635 Polyp of colon: Secondary | ICD-10-CM | POA: Diagnosis not present

## 2015-06-16 DIAGNOSIS — I10 Essential (primary) hypertension: Secondary | ICD-10-CM | POA: Diagnosis not present

## 2015-06-16 DIAGNOSIS — Z510 Encounter for antineoplastic radiation therapy: Secondary | ICD-10-CM | POA: Diagnosis not present

## 2015-06-16 DIAGNOSIS — Z87891 Personal history of nicotine dependence: Secondary | ICD-10-CM | POA: Diagnosis not present

## 2015-06-16 DIAGNOSIS — Z809 Family history of malignant neoplasm, unspecified: Secondary | ICD-10-CM | POA: Diagnosis not present

## 2015-06-17 ENCOUNTER — Ambulatory Visit
Admission: RE | Admit: 2015-06-17 | Discharge: 2015-06-17 | Disposition: A | Discharge status: Home / Self Care | Payer: Medicare Other | Source: Ambulatory Visit | Attending: Radiation Oncology | Admitting: Radiation Oncology

## 2015-06-17 DIAGNOSIS — C61 Malignant neoplasm of prostate: Secondary | ICD-10-CM | POA: Diagnosis not present

## 2015-06-17 DIAGNOSIS — Z510 Encounter for antineoplastic radiation therapy: Secondary | ICD-10-CM | POA: Diagnosis not present

## 2015-06-17 DIAGNOSIS — I5032 Chronic diastolic (congestive) heart failure: Secondary | ICD-10-CM | POA: Diagnosis not present

## 2015-06-17 DIAGNOSIS — F329 Major depressive disorder, single episode, unspecified: Secondary | ICD-10-CM | POA: Diagnosis not present

## 2015-06-17 DIAGNOSIS — K635 Polyp of colon: Secondary | ICD-10-CM | POA: Diagnosis not present

## 2015-06-17 DIAGNOSIS — Z809 Family history of malignant neoplasm, unspecified: Secondary | ICD-10-CM | POA: Diagnosis not present

## 2015-06-17 DIAGNOSIS — F1010 Alcohol abuse, uncomplicated: Secondary | ICD-10-CM | POA: Diagnosis not present

## 2015-06-17 DIAGNOSIS — I10 Essential (primary) hypertension: Secondary | ICD-10-CM | POA: Diagnosis not present

## 2015-06-17 DIAGNOSIS — Z87891 Personal history of nicotine dependence: Secondary | ICD-10-CM | POA: Diagnosis not present

## 2015-06-17 DIAGNOSIS — L0390 Cellulitis, unspecified: Secondary | ICD-10-CM | POA: Diagnosis not present

## 2015-06-18 ENCOUNTER — Ambulatory Visit
Admission: RE | Admit: 2015-06-18 | Discharge: 2015-06-18 | Disposition: A | Discharge status: Home / Self Care | Payer: Medicare Other | Source: Ambulatory Visit | Attending: Radiation Oncology | Admitting: Radiation Oncology

## 2015-06-18 DIAGNOSIS — Z809 Family history of malignant neoplasm, unspecified: Secondary | ICD-10-CM | POA: Diagnosis not present

## 2015-06-18 DIAGNOSIS — I10 Essential (primary) hypertension: Secondary | ICD-10-CM | POA: Diagnosis not present

## 2015-06-18 DIAGNOSIS — Z87891 Personal history of nicotine dependence: Secondary | ICD-10-CM | POA: Diagnosis not present

## 2015-06-18 DIAGNOSIS — K635 Polyp of colon: Secondary | ICD-10-CM | POA: Diagnosis not present

## 2015-06-18 DIAGNOSIS — F1010 Alcohol abuse, uncomplicated: Secondary | ICD-10-CM | POA: Diagnosis not present

## 2015-06-18 DIAGNOSIS — L0390 Cellulitis, unspecified: Secondary | ICD-10-CM | POA: Diagnosis not present

## 2015-06-18 DIAGNOSIS — C61 Malignant neoplasm of prostate: Secondary | ICD-10-CM | POA: Diagnosis not present

## 2015-06-18 DIAGNOSIS — Z510 Encounter for antineoplastic radiation therapy: Secondary | ICD-10-CM | POA: Diagnosis not present

## 2015-06-18 DIAGNOSIS — I5032 Chronic diastolic (congestive) heart failure: Secondary | ICD-10-CM | POA: Diagnosis not present

## 2015-06-18 DIAGNOSIS — F329 Major depressive disorder, single episode, unspecified: Secondary | ICD-10-CM | POA: Diagnosis not present

## 2015-06-19 ENCOUNTER — Ambulatory Visit
Admission: RE | Admit: 2015-06-19 | Discharge: 2015-06-19 | Disposition: A | Discharge status: Home / Self Care | Payer: Medicare Other | Source: Ambulatory Visit | Attending: Radiation Oncology | Admitting: Radiation Oncology

## 2015-06-19 DIAGNOSIS — F329 Major depressive disorder, single episode, unspecified: Secondary | ICD-10-CM | POA: Diagnosis not present

## 2015-06-19 DIAGNOSIS — I10 Essential (primary) hypertension: Secondary | ICD-10-CM | POA: Diagnosis not present

## 2015-06-19 DIAGNOSIS — C61 Malignant neoplasm of prostate: Secondary | ICD-10-CM | POA: Diagnosis not present

## 2015-06-19 DIAGNOSIS — I5032 Chronic diastolic (congestive) heart failure: Secondary | ICD-10-CM | POA: Diagnosis not present

## 2015-06-19 DIAGNOSIS — L0390 Cellulitis, unspecified: Secondary | ICD-10-CM | POA: Diagnosis not present

## 2015-06-19 DIAGNOSIS — K635 Polyp of colon: Secondary | ICD-10-CM | POA: Diagnosis not present

## 2015-06-19 DIAGNOSIS — Z809 Family history of malignant neoplasm, unspecified: Secondary | ICD-10-CM | POA: Diagnosis not present

## 2015-06-19 DIAGNOSIS — Z510 Encounter for antineoplastic radiation therapy: Secondary | ICD-10-CM | POA: Diagnosis not present

## 2015-06-19 DIAGNOSIS — F1010 Alcohol abuse, uncomplicated: Secondary | ICD-10-CM | POA: Diagnosis not present

## 2015-06-19 DIAGNOSIS — Z87891 Personal history of nicotine dependence: Secondary | ICD-10-CM | POA: Diagnosis not present

## 2015-06-20 ENCOUNTER — Ambulatory Visit
Admission: RE | Admit: 2015-06-20 | Discharge: 2015-06-20 | Disposition: A | Discharge status: Home / Self Care | Payer: Medicare Other | Source: Ambulatory Visit | Attending: Radiation Oncology | Admitting: Radiation Oncology

## 2015-06-20 ENCOUNTER — Encounter: Payer: Self-pay | Admitting: Radiation Oncology

## 2015-06-20 VITALS — BP 139/65 | HR 68 | Resp 16 | Wt 190.6 lb

## 2015-06-20 DIAGNOSIS — C61 Malignant neoplasm of prostate: Secondary | ICD-10-CM | POA: Diagnosis not present

## 2015-06-20 DIAGNOSIS — Z809 Family history of malignant neoplasm, unspecified: Secondary | ICD-10-CM | POA: Diagnosis not present

## 2015-06-20 DIAGNOSIS — I10 Essential (primary) hypertension: Secondary | ICD-10-CM | POA: Diagnosis not present

## 2015-06-20 DIAGNOSIS — K635 Polyp of colon: Secondary | ICD-10-CM | POA: Diagnosis not present

## 2015-06-20 DIAGNOSIS — F1010 Alcohol abuse, uncomplicated: Secondary | ICD-10-CM | POA: Diagnosis not present

## 2015-06-20 DIAGNOSIS — L0390 Cellulitis, unspecified: Secondary | ICD-10-CM | POA: Diagnosis not present

## 2015-06-20 DIAGNOSIS — I5032 Chronic diastolic (congestive) heart failure: Secondary | ICD-10-CM | POA: Diagnosis not present

## 2015-06-20 DIAGNOSIS — Z510 Encounter for antineoplastic radiation therapy: Secondary | ICD-10-CM | POA: Diagnosis not present

## 2015-06-20 DIAGNOSIS — F329 Major depressive disorder, single episode, unspecified: Secondary | ICD-10-CM | POA: Diagnosis not present

## 2015-06-20 DIAGNOSIS — Z87891 Personal history of nicotine dependence: Secondary | ICD-10-CM | POA: Diagnosis not present

## 2015-06-20 NOTE — Progress Notes (Signed)
  Radiation Oncology         [phone]   Name: Scott Newman MRN: [id number]   Date: 06/20/2015  DOB: [birth year]     Weekly Radiation Therapy Management    ICD-9-CM ICD-10-CM   1. Malignant neoplasm of prostate ([facility]) 185 C61     Current Dose: 35.1 Gy  Planned Dose:  78 Gy  Narrative The patient presents for routine under treatment assessment.   Weight and vitals stable. Denies pain. Reports urinary frequency and urgency. Denies dysuria, nocturia or hematuria. Denies diarrhea. Denies fatigue. Denies bowel problems.   The patient is without complaint. Set-up films were reviewed. The chart was checked.  Physical Findings  weight is 190 lb 9.6 oz (86.456 kg). His blood pressure is 139/65 and his pulse is 68. His respiration is 16 and oxygen saturation is 100%. . Weight essentially stable.  No significant changes.  Impression The patient is tolerating radiation.  Plan Continue treatment as planned.         Sheral Apley Tammi Klippel, M.D.  This document serves as a record of services personally performed by Tyler Pita, MD. It was created on his behalf by Arlyce Harman, a trained medical scribe. The creation of this record is based on the scribe's personal observations and the provider's statements to them. This document has been checked and approved by the attending provider.

## 2015-06-20 NOTE — Progress Notes (Signed)
Weight and vitals stable. Denies pain. Reports urinary frequency and urgency. Denies dysuria, nocturia or hematuria. Denies diarrhea. Denies fatigue.   BP 139/65 mmHg  Pulse 68  Resp 16  Wt 190 lb 9.6 oz (86.456 kg)  SpO2 100% Wt Readings from Last 3 Encounters:  06/20/15 190 lb 9.6 oz (86.456 kg)  06/13/15 190 lb 3.2 oz (86.274 kg)  06/03/15 191 lb 8 oz (86.864 kg)

## 2015-06-23 ENCOUNTER — Ambulatory Visit
Admission: RE | Admit: 2015-06-23 | Discharge: 2015-06-23 | Disposition: A | Discharge status: Home / Self Care | Payer: Medicare Other | Source: Ambulatory Visit | Attending: Radiation Oncology | Admitting: Radiation Oncology

## 2015-06-23 DIAGNOSIS — F1010 Alcohol abuse, uncomplicated: Secondary | ICD-10-CM | POA: Diagnosis not present

## 2015-06-23 DIAGNOSIS — K635 Polyp of colon: Secondary | ICD-10-CM | POA: Diagnosis not present

## 2015-06-23 DIAGNOSIS — Z87891 Personal history of nicotine dependence: Secondary | ICD-10-CM | POA: Diagnosis not present

## 2015-06-23 DIAGNOSIS — I10 Essential (primary) hypertension: Secondary | ICD-10-CM | POA: Diagnosis not present

## 2015-06-23 DIAGNOSIS — Z809 Family history of malignant neoplasm, unspecified: Secondary | ICD-10-CM | POA: Diagnosis not present

## 2015-06-23 DIAGNOSIS — L0390 Cellulitis, unspecified: Secondary | ICD-10-CM | POA: Diagnosis not present

## 2015-06-23 DIAGNOSIS — C61 Malignant neoplasm of prostate: Secondary | ICD-10-CM | POA: Diagnosis not present

## 2015-06-23 DIAGNOSIS — F329 Major depressive disorder, single episode, unspecified: Secondary | ICD-10-CM | POA: Diagnosis not present

## 2015-06-23 DIAGNOSIS — I5032 Chronic diastolic (congestive) heart failure: Secondary | ICD-10-CM | POA: Diagnosis not present

## 2015-06-23 DIAGNOSIS — Z510 Encounter for antineoplastic radiation therapy: Secondary | ICD-10-CM | POA: Diagnosis not present

## 2015-06-24 ENCOUNTER — Ambulatory Visit
Admission: RE | Admit: 2015-06-24 | Discharge: 2015-06-24 | Disposition: A | Discharge status: Home / Self Care | Payer: Medicare Other | Source: Ambulatory Visit | Attending: Radiation Oncology | Admitting: Radiation Oncology

## 2015-06-24 DIAGNOSIS — I10 Essential (primary) hypertension: Secondary | ICD-10-CM | POA: Diagnosis not present

## 2015-06-24 DIAGNOSIS — C61 Malignant neoplasm of prostate: Secondary | ICD-10-CM | POA: Diagnosis not present

## 2015-06-24 DIAGNOSIS — L0390 Cellulitis, unspecified: Secondary | ICD-10-CM | POA: Diagnosis not present

## 2015-06-24 DIAGNOSIS — Z87891 Personal history of nicotine dependence: Secondary | ICD-10-CM | POA: Diagnosis not present

## 2015-06-24 DIAGNOSIS — Z510 Encounter for antineoplastic radiation therapy: Secondary | ICD-10-CM | POA: Diagnosis not present

## 2015-06-24 DIAGNOSIS — F329 Major depressive disorder, single episode, unspecified: Secondary | ICD-10-CM | POA: Diagnosis not present

## 2015-06-24 DIAGNOSIS — F1010 Alcohol abuse, uncomplicated: Secondary | ICD-10-CM | POA: Diagnosis not present

## 2015-06-24 DIAGNOSIS — Z809 Family history of malignant neoplasm, unspecified: Secondary | ICD-10-CM | POA: Diagnosis not present

## 2015-06-24 DIAGNOSIS — K635 Polyp of colon: Secondary | ICD-10-CM | POA: Diagnosis not present

## 2015-06-24 DIAGNOSIS — I5032 Chronic diastolic (congestive) heart failure: Secondary | ICD-10-CM | POA: Diagnosis not present

## 2015-06-25 ENCOUNTER — Ambulatory Visit
Admission: RE | Admit: 2015-06-25 | Discharge: 2015-06-25 | Disposition: A | Discharge status: Home / Self Care | Payer: Medicare Other | Source: Ambulatory Visit | Attending: Radiation Oncology | Admitting: Radiation Oncology

## 2015-06-25 DIAGNOSIS — Z809 Family history of malignant neoplasm, unspecified: Secondary | ICD-10-CM | POA: Diagnosis not present

## 2015-06-25 DIAGNOSIS — L0390 Cellulitis, unspecified: Secondary | ICD-10-CM | POA: Diagnosis not present

## 2015-06-25 DIAGNOSIS — F329 Major depressive disorder, single episode, unspecified: Secondary | ICD-10-CM | POA: Diagnosis not present

## 2015-06-25 DIAGNOSIS — C61 Malignant neoplasm of prostate: Secondary | ICD-10-CM | POA: Diagnosis not present

## 2015-06-25 DIAGNOSIS — Z510 Encounter for antineoplastic radiation therapy: Secondary | ICD-10-CM | POA: Diagnosis not present

## 2015-06-25 DIAGNOSIS — I5032 Chronic diastolic (congestive) heart failure: Secondary | ICD-10-CM | POA: Diagnosis not present

## 2015-06-25 DIAGNOSIS — F1010 Alcohol abuse, uncomplicated: Secondary | ICD-10-CM | POA: Diagnosis not present

## 2015-06-25 DIAGNOSIS — Z87891 Personal history of nicotine dependence: Secondary | ICD-10-CM | POA: Diagnosis not present

## 2015-06-25 DIAGNOSIS — K635 Polyp of colon: Secondary | ICD-10-CM | POA: Diagnosis not present

## 2015-06-25 DIAGNOSIS — I10 Essential (primary) hypertension: Secondary | ICD-10-CM | POA: Diagnosis not present

## 2015-06-26 ENCOUNTER — Ambulatory Visit
Admission: RE | Admit: 2015-06-26 | Discharge: 2015-06-26 | Disposition: A | Discharge status: Home / Self Care | Payer: Medicare Other | Source: Ambulatory Visit | Attending: Radiation Oncology | Admitting: Radiation Oncology

## 2015-06-26 DIAGNOSIS — C61 Malignant neoplasm of prostate: Secondary | ICD-10-CM | POA: Diagnosis not present

## 2015-06-26 DIAGNOSIS — F329 Major depressive disorder, single episode, unspecified: Secondary | ICD-10-CM | POA: Diagnosis not present

## 2015-06-26 DIAGNOSIS — L0390 Cellulitis, unspecified: Secondary | ICD-10-CM | POA: Diagnosis not present

## 2015-06-26 DIAGNOSIS — K635 Polyp of colon: Secondary | ICD-10-CM | POA: Diagnosis not present

## 2015-06-26 DIAGNOSIS — Z510 Encounter for antineoplastic radiation therapy: Secondary | ICD-10-CM | POA: Diagnosis not present

## 2015-06-26 DIAGNOSIS — I10 Essential (primary) hypertension: Secondary | ICD-10-CM | POA: Diagnosis not present

## 2015-06-26 DIAGNOSIS — I5032 Chronic diastolic (congestive) heart failure: Secondary | ICD-10-CM | POA: Diagnosis not present

## 2015-06-26 DIAGNOSIS — Z87891 Personal history of nicotine dependence: Secondary | ICD-10-CM | POA: Diagnosis not present

## 2015-06-26 DIAGNOSIS — F1010 Alcohol abuse, uncomplicated: Secondary | ICD-10-CM | POA: Diagnosis not present

## 2015-06-26 DIAGNOSIS — Z809 Family history of malignant neoplasm, unspecified: Secondary | ICD-10-CM | POA: Diagnosis not present

## 2015-06-27 ENCOUNTER — Ambulatory Visit
Admission: RE | Admit: 2015-06-27 | Discharge: 2015-06-27 | Disposition: A | Discharge status: Home / Self Care | Payer: Medicare Other | Source: Ambulatory Visit | Attending: Radiation Oncology | Admitting: Radiation Oncology

## 2015-06-27 VITALS — BP 122/64 | HR 74 | Resp 16 | Wt 190.0 lb

## 2015-06-27 DIAGNOSIS — I10 Essential (primary) hypertension: Secondary | ICD-10-CM | POA: Diagnosis not present

## 2015-06-27 DIAGNOSIS — Z87891 Personal history of nicotine dependence: Secondary | ICD-10-CM | POA: Diagnosis not present

## 2015-06-27 DIAGNOSIS — C61 Malignant neoplasm of prostate: Secondary | ICD-10-CM

## 2015-06-27 DIAGNOSIS — I5032 Chronic diastolic (congestive) heart failure: Secondary | ICD-10-CM | POA: Diagnosis not present

## 2015-06-27 DIAGNOSIS — Z809 Family history of malignant neoplasm, unspecified: Secondary | ICD-10-CM | POA: Diagnosis not present

## 2015-06-27 DIAGNOSIS — L0390 Cellulitis, unspecified: Secondary | ICD-10-CM | POA: Diagnosis not present

## 2015-06-27 DIAGNOSIS — F1010 Alcohol abuse, uncomplicated: Secondary | ICD-10-CM | POA: Diagnosis not present

## 2015-06-27 DIAGNOSIS — F329 Major depressive disorder, single episode, unspecified: Secondary | ICD-10-CM | POA: Diagnosis not present

## 2015-06-27 DIAGNOSIS — K635 Polyp of colon: Secondary | ICD-10-CM | POA: Diagnosis not present

## 2015-06-27 DIAGNOSIS — Z510 Encounter for antineoplastic radiation therapy: Secondary | ICD-10-CM | POA: Diagnosis not present

## 2015-06-27 NOTE — Progress Notes (Signed)
  Radiation Oncology         [phone]   Name: Scott Newman MRN: [id number]   Date: 06/27/2015  DOB: 02/10/1946     Weekly Radiation Therapy Management    ICD-9-CM ICD-10-CM   1. Malignant neoplasm of prostate ([facility]) 185 C61     Current Dose: 44.85 Gy  Planned Dose:  78 Gy  Narrative The patient presents for routine under treatment assessment.   Weight and vitals stable. Denies pain. Reports an episode of urinary incontinence yesterday. Reports great urgency. Reports dysuria. Denies hematuria. Denies diarrhea. Reports fatigue.   The patient is without complaint. Set-up films were reviewed. The chart was checked.  Physical Findings  weight is 190 lb (86.183 kg). His blood pressure is 122/64 and his pulse is 74. His respiration is 16 and oxygen saturation is 100%. . Weight essentially stable.  No significant changes.  Impression The patient is tolerating radiation.  Plan Continue treatment as planned.         Sheral Apley Tammi Klippel, M.D.  This document serves as a record of services personally performed by Tyler Pita, MD. It was created on his behalf by Lendon Collar, a trained medical scribe. The creation of this record is based on the scribe's personal observations and the provider's statements to them. This document has been checked and approved by the attending provider.

## 2015-06-27 NOTE — Progress Notes (Addendum)
Weight and vitals stable. Denies pain. Reports an episode of urinary incontinence yesterday. Reports great urgency. Reports dysuria. Denies hematuria. Denies diarrhea. Reports fatigue.   BP 122/64 mmHg  Pulse 74  Resp 16  Wt 190 lb (86.183 kg)  SpO2 100% Wt Readings from Last 3 Encounters:  06/27/15 190 lb (86.183 kg)  06/20/15 190 lb 9.6 oz (86.456 kg)  06/13/15 190 lb 3.2 oz (86.274 kg)

## 2015-06-30 ENCOUNTER — Ambulatory Visit
Admission: RE | Admit: 2015-06-30 | Discharge: 2015-06-30 | Disposition: A | Discharge status: Home / Self Care | Payer: Medicare Other | Source: Ambulatory Visit | Attending: Radiation Oncology | Admitting: Radiation Oncology

## 2015-06-30 DIAGNOSIS — I10 Essential (primary) hypertension: Secondary | ICD-10-CM | POA: Diagnosis not present

## 2015-06-30 DIAGNOSIS — Z87891 Personal history of nicotine dependence: Secondary | ICD-10-CM | POA: Diagnosis not present

## 2015-06-30 DIAGNOSIS — K635 Polyp of colon: Secondary | ICD-10-CM | POA: Diagnosis not present

## 2015-06-30 DIAGNOSIS — L0390 Cellulitis, unspecified: Secondary | ICD-10-CM | POA: Diagnosis not present

## 2015-06-30 DIAGNOSIS — Z510 Encounter for antineoplastic radiation therapy: Secondary | ICD-10-CM | POA: Diagnosis not present

## 2015-06-30 DIAGNOSIS — I5032 Chronic diastolic (congestive) heart failure: Secondary | ICD-10-CM | POA: Diagnosis not present

## 2015-06-30 DIAGNOSIS — Z809 Family history of malignant neoplasm, unspecified: Secondary | ICD-10-CM | POA: Diagnosis not present

## 2015-06-30 DIAGNOSIS — C61 Malignant neoplasm of prostate: Secondary | ICD-10-CM | POA: Diagnosis not present

## 2015-06-30 DIAGNOSIS — F1010 Alcohol abuse, uncomplicated: Secondary | ICD-10-CM | POA: Diagnosis not present

## 2015-06-30 DIAGNOSIS — F329 Major depressive disorder, single episode, unspecified: Secondary | ICD-10-CM | POA: Diagnosis not present

## 2015-07-01 ENCOUNTER — Ambulatory Visit
Admission: RE | Admit: 2015-07-01 | Discharge: 2015-07-01 | Disposition: A | Discharge status: Home / Self Care | Payer: Medicare Other | Source: Ambulatory Visit | Attending: Radiation Oncology | Admitting: Radiation Oncology

## 2015-07-01 DIAGNOSIS — Z809 Family history of malignant neoplasm, unspecified: Secondary | ICD-10-CM | POA: Diagnosis not present

## 2015-07-01 DIAGNOSIS — K635 Polyp of colon: Secondary | ICD-10-CM | POA: Diagnosis not present

## 2015-07-01 DIAGNOSIS — Z87891 Personal history of nicotine dependence: Secondary | ICD-10-CM | POA: Diagnosis not present

## 2015-07-01 DIAGNOSIS — Z510 Encounter for antineoplastic radiation therapy: Secondary | ICD-10-CM | POA: Diagnosis not present

## 2015-07-01 DIAGNOSIS — I5032 Chronic diastolic (congestive) heart failure: Secondary | ICD-10-CM | POA: Diagnosis not present

## 2015-07-01 DIAGNOSIS — F1010 Alcohol abuse, uncomplicated: Secondary | ICD-10-CM | POA: Diagnosis not present

## 2015-07-01 DIAGNOSIS — C61 Malignant neoplasm of prostate: Secondary | ICD-10-CM | POA: Diagnosis not present

## 2015-07-01 DIAGNOSIS — L0390 Cellulitis, unspecified: Secondary | ICD-10-CM | POA: Diagnosis not present

## 2015-07-01 DIAGNOSIS — F329 Major depressive disorder, single episode, unspecified: Secondary | ICD-10-CM | POA: Diagnosis not present

## 2015-07-01 DIAGNOSIS — I10 Essential (primary) hypertension: Secondary | ICD-10-CM | POA: Diagnosis not present

## 2015-07-02 ENCOUNTER — Ambulatory Visit
Admission: RE | Admit: 2015-07-02 | Discharge: 2015-07-02 | Disposition: A | Discharge status: Home / Self Care | Payer: Medicare Other | Source: Ambulatory Visit | Attending: Radiation Oncology | Admitting: Radiation Oncology

## 2015-07-02 DIAGNOSIS — L0390 Cellulitis, unspecified: Secondary | ICD-10-CM | POA: Diagnosis not present

## 2015-07-02 DIAGNOSIS — Z87891 Personal history of nicotine dependence: Secondary | ICD-10-CM | POA: Diagnosis not present

## 2015-07-02 DIAGNOSIS — F1010 Alcohol abuse, uncomplicated: Secondary | ICD-10-CM | POA: Diagnosis not present

## 2015-07-02 DIAGNOSIS — I10 Essential (primary) hypertension: Secondary | ICD-10-CM | POA: Diagnosis not present

## 2015-07-02 DIAGNOSIS — Z510 Encounter for antineoplastic radiation therapy: Secondary | ICD-10-CM | POA: Diagnosis not present

## 2015-07-02 DIAGNOSIS — F329 Major depressive disorder, single episode, unspecified: Secondary | ICD-10-CM | POA: Diagnosis not present

## 2015-07-02 DIAGNOSIS — I5032 Chronic diastolic (congestive) heart failure: Secondary | ICD-10-CM | POA: Diagnosis not present

## 2015-07-02 DIAGNOSIS — C61 Malignant neoplasm of prostate: Secondary | ICD-10-CM | POA: Diagnosis not present

## 2015-07-02 DIAGNOSIS — Z809 Family history of malignant neoplasm, unspecified: Secondary | ICD-10-CM | POA: Diagnosis not present

## 2015-07-02 DIAGNOSIS — K635 Polyp of colon: Secondary | ICD-10-CM | POA: Diagnosis not present

## 2015-07-03 ENCOUNTER — Ambulatory Visit
Admission: RE | Admit: 2015-07-03 | Discharge: 2015-07-03 | Disposition: A | Discharge status: Home / Self Care | Payer: Medicare Other | Source: Ambulatory Visit | Attending: Radiation Oncology | Admitting: Radiation Oncology

## 2015-07-03 DIAGNOSIS — C61 Malignant neoplasm of prostate: Secondary | ICD-10-CM | POA: Diagnosis not present

## 2015-07-03 DIAGNOSIS — L0390 Cellulitis, unspecified: Secondary | ICD-10-CM | POA: Diagnosis not present

## 2015-07-03 DIAGNOSIS — Z87891 Personal history of nicotine dependence: Secondary | ICD-10-CM | POA: Diagnosis not present

## 2015-07-03 DIAGNOSIS — Z809 Family history of malignant neoplasm, unspecified: Secondary | ICD-10-CM | POA: Diagnosis not present

## 2015-07-03 DIAGNOSIS — K635 Polyp of colon: Secondary | ICD-10-CM | POA: Diagnosis not present

## 2015-07-03 DIAGNOSIS — Z510 Encounter for antineoplastic radiation therapy: Secondary | ICD-10-CM | POA: Diagnosis not present

## 2015-07-03 DIAGNOSIS — F1010 Alcohol abuse, uncomplicated: Secondary | ICD-10-CM | POA: Diagnosis not present

## 2015-07-03 DIAGNOSIS — F329 Major depressive disorder, single episode, unspecified: Secondary | ICD-10-CM | POA: Diagnosis not present

## 2015-07-03 DIAGNOSIS — I5032 Chronic diastolic (congestive) heart failure: Secondary | ICD-10-CM | POA: Diagnosis not present

## 2015-07-03 DIAGNOSIS — I10 Essential (primary) hypertension: Secondary | ICD-10-CM | POA: Diagnosis not present

## 2015-07-04 ENCOUNTER — Ambulatory Visit
Admission: RE | Admit: 2015-07-04 | Discharge: 2015-07-04 | Disposition: A | Discharge status: Home / Self Care | Payer: Medicare Other | Source: Ambulatory Visit | Attending: Radiation Oncology | Admitting: Radiation Oncology

## 2015-07-04 ENCOUNTER — Encounter: Payer: Self-pay | Admitting: Radiation Oncology

## 2015-07-04 VITALS — BP 104/61 | HR 68 | Resp 16 | Wt 187.9 lb

## 2015-07-04 DIAGNOSIS — Z87891 Personal history of nicotine dependence: Secondary | ICD-10-CM | POA: Diagnosis not present

## 2015-07-04 DIAGNOSIS — Z510 Encounter for antineoplastic radiation therapy: Secondary | ICD-10-CM | POA: Diagnosis not present

## 2015-07-04 DIAGNOSIS — K635 Polyp of colon: Secondary | ICD-10-CM | POA: Diagnosis not present

## 2015-07-04 DIAGNOSIS — F329 Major depressive disorder, single episode, unspecified: Secondary | ICD-10-CM | POA: Diagnosis not present

## 2015-07-04 DIAGNOSIS — L0390 Cellulitis, unspecified: Secondary | ICD-10-CM | POA: Diagnosis not present

## 2015-07-04 DIAGNOSIS — C61 Malignant neoplasm of prostate: Secondary | ICD-10-CM

## 2015-07-04 DIAGNOSIS — I5032 Chronic diastolic (congestive) heart failure: Secondary | ICD-10-CM | POA: Diagnosis not present

## 2015-07-04 DIAGNOSIS — Z809 Family history of malignant neoplasm, unspecified: Secondary | ICD-10-CM | POA: Diagnosis not present

## 2015-07-04 DIAGNOSIS — I10 Essential (primary) hypertension: Secondary | ICD-10-CM | POA: Diagnosis not present

## 2015-07-04 DIAGNOSIS — F1010 Alcohol abuse, uncomplicated: Secondary | ICD-10-CM | POA: Diagnosis not present

## 2015-07-04 NOTE — Progress Notes (Signed)
   Department of Radiation Oncology  Phone:  [phone] Fax:        [phone]  Weekly Treatment Note    Name: Scott Newman Date: 07/04/2015 MRN: [id number] DOB: [birth year]   Diagnosis:     ICD-9-CM ICD-10-CM   1. Malignant neoplasm of prostate ([place]) 185 C61      Current dose: 54.6 Gy  Current fraction: 28   MEDICATIONS: Current Outpatient Prescriptions  Medication Sig Dispense Refill  . aspirin 325 MG tablet Take 325 mg by mouth as needed.      Marland Kitchen co-enzyme Q-10 30 MG capsule Take 30 mg by mouth daily.     Marland Kitchen levothyroxine (SYNTHROID, LEVOTHROID) 50 MCG tablet Take 50 mcg by mouth daily before breakfast.    . LINZESS 145 MCG CAPS capsule Take 145 mcg by mouth every other day.     . lisinopril (PRINIVIL,ZESTRIL) 20 MG tablet TAKE 1 TABLET BY MOUTH EVERY DAY 90 tablet 2  . Multiple Vitamin (MULTI-VITAMIN PO) Take by mouth daily.       No current facility-administered medications for this encounter.     ALLERGIES: Acetaminophen   LABORATORY DATA:  Lab Results  Component Value Date   WBC 8.4 07/29/2014   HGB 13.0 07/29/2014   HCT 37.7 07/29/2014   MCV 90 07/29/2014   PLT 255 07/29/2014   Lab Results  Component Value Date   NA 133* 07/29/2014   K 4.4 07/29/2014   CL 92* 07/29/2014   CO2 24 07/29/2014   Lab Results  Component Value Date   ALT 22 07/29/2014   AST 74* 07/29/2014   ALKPHOS 52 07/29/2014   BILITOT 0.5 07/29/2014     NARRATIVE: Jerene Pitch Lassalle was seen today for weekly treatment management. The chart was checked and the patient's films were reviewed.  Denies pain, incontinence or leakage, hematuria, or diarrhea. Reports urgency, dysuria, and fatigue.   PHYSICAL EXAMINATION: weight is 187 lb 14.4 oz (85.231 kg). His blood pressure is 104/61 and his pulse is 68. His respiration is 16 and oxygen saturation is 100%.   ASSESSMENT: The patient is doing satisfactorily with treatment.  PLAN: We will continue with the patient's radiation  treatment as planned.  This document serves as a record of services personally performed by Kyung Rudd, MD. It was created on his behalf by Darcus Austin, a trained medical scribe. The creation of this record is based on the scribe's personal observations and the provider's statements to them. This document has been checked and approved by the attending provider.

## 2015-07-04 NOTE — Progress Notes (Signed)
Weight and vitals stable. Denies pain. Denies incontinence or leakage. Reports urgency and dysuria. Denies hematuria. Denies diarrhea. Reports fatigue.   BP 104/61 mmHg  Pulse 68  Resp 16  Wt 187 lb 14.4 oz (85.231 kg)  SpO2 100% Wt Readings from Last 3 Encounters:  07/04/15 187 lb 14.4 oz (85.231 kg)  06/27/15 190 lb (86.183 kg)  06/20/15 190 lb 9.6 oz (86.456 kg)

## 2015-07-08 ENCOUNTER — Ambulatory Visit
Admission: RE | Admit: 2015-07-08 | Discharge: 2015-07-08 | Disposition: A | Discharge status: Home / Self Care | Payer: Medicare Other | Source: Ambulatory Visit | Attending: Radiation Oncology | Admitting: Radiation Oncology

## 2015-07-08 DIAGNOSIS — I5032 Chronic diastolic (congestive) heart failure: Secondary | ICD-10-CM | POA: Diagnosis not present

## 2015-07-08 DIAGNOSIS — F1010 Alcohol abuse, uncomplicated: Secondary | ICD-10-CM | POA: Diagnosis not present

## 2015-07-08 DIAGNOSIS — Z510 Encounter for antineoplastic radiation therapy: Secondary | ICD-10-CM | POA: Diagnosis not present

## 2015-07-08 DIAGNOSIS — K635 Polyp of colon: Secondary | ICD-10-CM | POA: Diagnosis not present

## 2015-07-08 DIAGNOSIS — C61 Malignant neoplasm of prostate: Secondary | ICD-10-CM | POA: Diagnosis not present

## 2015-07-08 DIAGNOSIS — Z87891 Personal history of nicotine dependence: Secondary | ICD-10-CM | POA: Diagnosis not present

## 2015-07-08 DIAGNOSIS — I10 Essential (primary) hypertension: Secondary | ICD-10-CM | POA: Diagnosis not present

## 2015-07-08 DIAGNOSIS — L0390 Cellulitis, unspecified: Secondary | ICD-10-CM | POA: Diagnosis not present

## 2015-07-08 DIAGNOSIS — Z809 Family history of malignant neoplasm, unspecified: Secondary | ICD-10-CM | POA: Diagnosis not present

## 2015-07-08 DIAGNOSIS — F329 Major depressive disorder, single episode, unspecified: Secondary | ICD-10-CM | POA: Diagnosis not present

## 2015-07-09 ENCOUNTER — Ambulatory Visit
Admission: RE | Admit: 2015-07-09 | Discharge: 2015-07-09 | Disposition: A | Discharge status: Home / Self Care | Payer: Medicare Other | Source: Ambulatory Visit | Attending: Radiation Oncology | Admitting: Radiation Oncology

## 2015-07-09 DIAGNOSIS — L0390 Cellulitis, unspecified: Secondary | ICD-10-CM | POA: Diagnosis not present

## 2015-07-09 DIAGNOSIS — Z510 Encounter for antineoplastic radiation therapy: Secondary | ICD-10-CM | POA: Diagnosis not present

## 2015-07-09 DIAGNOSIS — F1010 Alcohol abuse, uncomplicated: Secondary | ICD-10-CM | POA: Diagnosis not present

## 2015-07-09 DIAGNOSIS — F329 Major depressive disorder, single episode, unspecified: Secondary | ICD-10-CM | POA: Diagnosis not present

## 2015-07-09 DIAGNOSIS — K635 Polyp of colon: Secondary | ICD-10-CM | POA: Diagnosis not present

## 2015-07-09 DIAGNOSIS — Z87891 Personal history of nicotine dependence: Secondary | ICD-10-CM | POA: Diagnosis not present

## 2015-07-09 DIAGNOSIS — I5032 Chronic diastolic (congestive) heart failure: Secondary | ICD-10-CM | POA: Diagnosis not present

## 2015-07-09 DIAGNOSIS — I10 Essential (primary) hypertension: Secondary | ICD-10-CM | POA: Diagnosis not present

## 2015-07-09 DIAGNOSIS — C61 Malignant neoplasm of prostate: Secondary | ICD-10-CM | POA: Diagnosis not present

## 2015-07-09 DIAGNOSIS — Z809 Family history of malignant neoplasm, unspecified: Secondary | ICD-10-CM | POA: Diagnosis not present

## 2015-07-10 ENCOUNTER — Ambulatory Visit
Admission: RE | Admit: 2015-07-10 | Discharge: 2015-07-10 | Disposition: A | Discharge status: Home / Self Care | Payer: Medicare Other | Source: Ambulatory Visit | Attending: Radiation Oncology | Admitting: Radiation Oncology

## 2015-07-10 DIAGNOSIS — C61 Malignant neoplasm of prostate: Secondary | ICD-10-CM | POA: Diagnosis not present

## 2015-07-10 DIAGNOSIS — Z809 Family history of malignant neoplasm, unspecified: Secondary | ICD-10-CM | POA: Diagnosis not present

## 2015-07-10 DIAGNOSIS — F1010 Alcohol abuse, uncomplicated: Secondary | ICD-10-CM | POA: Diagnosis not present

## 2015-07-10 DIAGNOSIS — L0390 Cellulitis, unspecified: Secondary | ICD-10-CM | POA: Diagnosis not present

## 2015-07-10 DIAGNOSIS — K635 Polyp of colon: Secondary | ICD-10-CM | POA: Diagnosis not present

## 2015-07-10 DIAGNOSIS — I5032 Chronic diastolic (congestive) heart failure: Secondary | ICD-10-CM | POA: Diagnosis not present

## 2015-07-10 DIAGNOSIS — Z510 Encounter for antineoplastic radiation therapy: Secondary | ICD-10-CM | POA: Diagnosis not present

## 2015-07-10 DIAGNOSIS — Z87891 Personal history of nicotine dependence: Secondary | ICD-10-CM | POA: Diagnosis not present

## 2015-07-10 DIAGNOSIS — F329 Major depressive disorder, single episode, unspecified: Secondary | ICD-10-CM | POA: Diagnosis not present

## 2015-07-10 DIAGNOSIS — I10 Essential (primary) hypertension: Secondary | ICD-10-CM | POA: Diagnosis not present

## 2015-07-11 ENCOUNTER — Encounter: Payer: Self-pay | Admitting: Radiation Oncology

## 2015-07-11 ENCOUNTER — Ambulatory Visit
Admission: RE | Admit: 2015-07-11 | Discharge: 2015-07-11 | Disposition: A | Discharge status: Home / Self Care | Payer: Medicare Other | Source: Ambulatory Visit | Attending: Radiation Oncology | Admitting: Radiation Oncology

## 2015-07-11 VITALS — BP 106/53 | HR 75 | Resp 16 | Wt 186.8 lb

## 2015-07-11 DIAGNOSIS — F1010 Alcohol abuse, uncomplicated: Secondary | ICD-10-CM | POA: Diagnosis not present

## 2015-07-11 DIAGNOSIS — Z510 Encounter for antineoplastic radiation therapy: Secondary | ICD-10-CM | POA: Diagnosis not present

## 2015-07-11 DIAGNOSIS — Z809 Family history of malignant neoplasm, unspecified: Secondary | ICD-10-CM | POA: Diagnosis not present

## 2015-07-11 DIAGNOSIS — K635 Polyp of colon: Secondary | ICD-10-CM | POA: Diagnosis not present

## 2015-07-11 DIAGNOSIS — I5032 Chronic diastolic (congestive) heart failure: Secondary | ICD-10-CM | POA: Diagnosis not present

## 2015-07-11 DIAGNOSIS — C61 Malignant neoplasm of prostate: Secondary | ICD-10-CM

## 2015-07-11 DIAGNOSIS — I10 Essential (primary) hypertension: Secondary | ICD-10-CM | POA: Diagnosis not present

## 2015-07-11 DIAGNOSIS — F329 Major depressive disorder, single episode, unspecified: Secondary | ICD-10-CM | POA: Diagnosis not present

## 2015-07-11 DIAGNOSIS — L0390 Cellulitis, unspecified: Secondary | ICD-10-CM | POA: Diagnosis not present

## 2015-07-11 DIAGNOSIS — Z87891 Personal history of nicotine dependence: Secondary | ICD-10-CM | POA: Diagnosis not present

## 2015-07-11 NOTE — Progress Notes (Signed)
Weight and vitals stable. Denies pain. Reports diarrhea since last Friday. Reports wearing a depends during the night time hours to manage rectal incontinence. Reports urinary urgency. Reports vaginal urgency or leakage. Reports nocturia x 2-3. Reports dysuria is less. Reports fatigue.   BP 106/53 mmHg  Pulse 75  Resp 16  Wt 186 lb 12.8 oz (84.732 kg)  SpO2 100% Wt Readings from Last 3 Encounters:  07/11/15 186 lb 12.8 oz (84.732 kg)  07/04/15 187 lb 14.4 oz (85.231 kg)  06/27/15 190 lb (86.183 kg)

## 2015-07-11 NOTE — Progress Notes (Signed)
  Radiation Oncology         [phone]   Name: Scott Newman MRN: [id number]   Date: 07/11/2015  DOB: 27-Jul-1945     Weekly Radiation Therapy Management    ICD-9-CM ICD-10-CM   1. Malignant neoplasm of prostate ([facility]) 185 C61     Current Dose: 62.4 Gy  Planned Dose:  78 Gy  Narrative The patient presents for routine under treatment assessment.   Weight and vitals stable. Denies pain. Reports diarrhea since last Friday. Reports wearing a depends during the night time hours to manage rectal incontinence. Reports urinary urgency. Reports rectal urgency or leakage. Reports nocturia x 2-3. Reports dysuria is less. Reports fatigue.   The patient is without complaint. Set-up films were reviewed. The chart was checked.  Physical Findings  weight is 186 lb 12.8 oz (84.732 kg). His blood pressure is 106/53 and his pulse is 75. His respiration is 16 and oxygen saturation is 100%. . Weight essentially stable.  No significant changes.  Impression The patient is tolerating radiation.  Plan Continue treatment as planned. We discussed some dietary changes that will help with his diarrhea. If this progresses, we will look into prescribing him medication for the diarrhea.          Sheral Apley Tammi Klippel, M.D.  This document serves as a record of services personally performed by Tyler Pita, MD. It was created on his behalf by Lendon Collar, a trained medical scribe. The creation of this record is based on the scribe's personal observations and the provider's statements to them. This document has been checked and approved by the attending provider.

## 2015-07-15 ENCOUNTER — Ambulatory Visit
Admission: RE | Admit: 2015-07-15 | Discharge: 2015-07-15 | Disposition: A | Discharge status: Home / Self Care | Payer: Medicare Other | Source: Ambulatory Visit | Attending: Radiation Oncology | Admitting: Radiation Oncology

## 2015-07-15 DIAGNOSIS — C61 Malignant neoplasm of prostate: Secondary | ICD-10-CM | POA: Insufficient documentation

## 2015-07-16 ENCOUNTER — Ambulatory Visit
Admission: RE | Admit: 2015-07-16 | Discharge: 2015-07-16 | Disposition: A | Discharge status: Home / Self Care | Payer: Medicare Other | Source: Ambulatory Visit | Attending: Radiation Oncology | Admitting: Radiation Oncology

## 2015-07-16 DIAGNOSIS — C61 Malignant neoplasm of prostate: Secondary | ICD-10-CM | POA: Diagnosis not present

## 2015-07-17 ENCOUNTER — Ambulatory Visit
Admission: RE | Admit: 2015-07-17 | Discharge: 2015-07-17 | Disposition: A | Discharge status: Home / Self Care | Payer: Medicare Other | Source: Ambulatory Visit | Attending: Radiation Oncology | Admitting: Radiation Oncology

## 2015-07-17 DIAGNOSIS — C61 Malignant neoplasm of prostate: Secondary | ICD-10-CM | POA: Diagnosis not present

## 2015-07-18 ENCOUNTER — Encounter: Payer: Self-pay | Admitting: Radiation Oncology

## 2015-07-18 ENCOUNTER — Ambulatory Visit
Admission: RE | Admit: 2015-07-18 | Discharge: 2015-07-18 | Disposition: A | Discharge status: Home / Self Care | Payer: Medicare Other | Source: Ambulatory Visit | Attending: Radiation Oncology | Admitting: Radiation Oncology

## 2015-07-18 VITALS — BP 115/59 | HR 72 | Resp 16 | Wt 182.0 lb

## 2015-07-18 DIAGNOSIS — C61 Malignant neoplasm of prostate: Secondary | ICD-10-CM

## 2015-07-18 NOTE — Progress Notes (Signed)
  Radiation Oncology         [phone]   Name: Scott Newman MRN: [id number]   Date: 07/18/2015  DOB: 27-Jul-1945     Weekly Radiation Therapy Management    ICD-9-CM ICD-10-CM   1. Malignant neoplasm of prostate (HCC) 185 C61     Current Dose: 70.2 Gy  Planned Dose:  78 Gy  Narrative The patient presents for routine under treatment assessment.   Weight and vitals stable. Denies pain. Reports increased frequency of diarrhea and fecal incontinence. Reports he has stopped eating solid foods but, instead drinking green shake with bananas. States that solid foods create more accidents and painful bowel movements. He also drinks aloe to help resolve bowel discomfort. Reports that his lips began splitting and fruit drinks resolved it. Reports urinary urgency and frequency continues. Reports nocturia x 3. Reports dysuria is no worse. Reports fatigue. He began imodium over the weekend and he now takes one in the am and one in the pm.    The patient is without complaint. Set-up films were reviewed. The chart was checked.  Physical Findings  weight is 182 lb (82.555 kg). His blood pressure is 115/59 and his pulse is 72. His respiration is 16 and oxygen saturation is 100%. . Weight essentially stable.  No significant changes.  Impression The patient is tolerating radiation.  Plan Continue treatment as planned. The patient will complete radiation treatment next week and we will follow up in one month.         Sheral Apley Tammi Klippel, M.D.  This document serves as a record of services personally performed by Tyler Pita, MD. It was created on his behalf by Arlyce Harman, a trained medical scribe. The creation of this record is based on the scribe's personal observations and the provider's statements to them. This document has been checked and approved by the attending provider.

## 2015-07-18 NOTE — Progress Notes (Addendum)
Weight and vitals stable. Denies pain. Reports increased frequency of diarrhea and fecal incontinence. Reports he has stopped eating solid foods but, instead drinking green shake with bananas. Reports urinary urgency and frequency continues. Reports nocturia x 3. Reports dysuria is no worse. Reports fatigue. One month follow up appointment card given.  BP 115/59 mmHg  Pulse 72  Resp 16  Wt 182 lb (82.555 kg)  SpO2 100% Wt Readings from Last 3 Encounters:  07/18/15 182 lb (82.555 kg)  07/11/15 186 lb 12.8 oz (84.732 kg)  07/04/15 187 lb 14.4 oz (85.231 kg)

## 2015-07-21 ENCOUNTER — Ambulatory Visit
Admission: RE | Admit: 2015-07-21 | Discharge: 2015-07-21 | Disposition: A | Discharge status: Home / Self Care | Payer: Medicare Other | Source: Ambulatory Visit | Attending: Radiation Oncology | Admitting: Radiation Oncology

## 2015-07-21 DIAGNOSIS — C61 Malignant neoplasm of prostate: Secondary | ICD-10-CM | POA: Diagnosis not present

## 2015-07-22 ENCOUNTER — Ambulatory Visit
Admission: RE | Admit: 2015-07-22 | Discharge: 2015-07-22 | Disposition: A | Discharge status: Home / Self Care | Payer: Medicare Other | Source: Ambulatory Visit | Attending: Radiation Oncology | Admitting: Radiation Oncology

## 2015-07-22 DIAGNOSIS — C61 Malignant neoplasm of prostate: Secondary | ICD-10-CM | POA: Diagnosis not present

## 2015-07-23 ENCOUNTER — Ambulatory Visit
Admission: RE | Admit: 2015-07-23 | Discharge: 2015-07-23 | Disposition: A | Discharge status: Home / Self Care | Payer: Medicare Other | Source: Ambulatory Visit | Attending: Radiation Oncology | Admitting: Radiation Oncology

## 2015-07-23 DIAGNOSIS — C61 Malignant neoplasm of prostate: Secondary | ICD-10-CM | POA: Diagnosis not present

## 2015-07-24 ENCOUNTER — Ambulatory Visit
Admission: RE | Admit: 2015-07-24 | Discharge: 2015-07-24 | Disposition: A | Discharge status: Home / Self Care | Payer: Medicare Other | Source: Ambulatory Visit | Attending: Radiation Oncology | Admitting: Radiation Oncology

## 2015-07-24 ENCOUNTER — Encounter: Payer: Self-pay | Admitting: Radiation Oncology

## 2015-07-24 DIAGNOSIS — C61 Malignant neoplasm of prostate: Secondary | ICD-10-CM | POA: Diagnosis not present

## 2015-08-24 NOTE — Progress Notes (Signed)
  Radiation Oncology         [phone] ________________________________  Name: Scott Newman MRN: [id number]  Date: 07/24/2015  DOB: April 15, 1946  End of Treatment Note    ICD-9-CM ICD-10-CM   1. Malignant neoplasm of prostate ([place]) 185 C61     DIAGNOSIS: 70 y.o. gentleman with stage T1c adenocarcinoma of the prostate with a Gleason's score of 4+3 and a PSA of 9.24.     Indication for treatment:  Curative, Definitive Radiotherapy       Radiation treatment dates:   05/27/2015-07/24/2015  Site/dose:   The prostate was treated to 78 Gy in 40 fractions of 1.95 Gy  Beams/energy:   The patient was treated with IMRT using volumetric arc therapy delivering 6 MV X-rays to clockwise and counterclockwise circumferential arcs with a 90 degree collimator offset to avoid dose scalloping.  Image guidance was performed with daily cone beam CT prior to each fraction to align to gold markers in the prostate and assure proper bladder and rectal fill volumes.  Immobilization was achieved with BodyFix custom mold.  Narrative: The patient tolerated radiation treatment relatively well.   The patient experienced some minor urinary irritation, minor rectal irritation, and modest fatigue.   Plan: The patient has completed radiation treatment. He will return to radiation oncology clinic for routine followup in one month. I advised him to call or return sooner if he has any questions or concerns related to his recovery or treatment. ________________________________  Sheral Apley. Tammi Klippel, M.D.  This document serves as a record of services personally performed by Tyler Pita, MD. It was created on his behalf by Arlyce Harman, a trained medical scribe. The creation of this record is based on the scribe's personal observations and the provider's statements to them. This document has been checked and approved by the attending provider.

## 2015-08-28 ENCOUNTER — Ambulatory Visit
Admission: RE | Admit: 2015-08-28 | Discharge: 2015-08-28 | Disposition: A | Discharge status: Home / Self Care | Payer: Medicare Other | Source: Ambulatory Visit | Attending: Radiation Oncology | Admitting: Radiation Oncology

## 2015-08-28 VITALS — BP 116/62 | HR 64 | Resp 16 | Wt 178.7 lb

## 2015-08-28 DIAGNOSIS — C61 Malignant neoplasm of prostate: Secondary | ICD-10-CM

## 2015-08-28 NOTE — Progress Notes (Addendum)
Vitals stable. Weight loss noted. Reports energy level is slowly and lightly improving. Reports he has difficulty staying warm. Patient noted to have multiple layers of clothing on today. Reports episodes of diarrhea continue but are less. Reports nocturia x 1. Reports dysuria resolved "16 days" s/p completion of xrt. Reports urgency continues. Reports intermittent episodes of incontinence. Denies hematuria. Scheduled to follow up with urologist in approximately two months.   BP 116/62 mmHg  Pulse 64  Resp 16  Wt 178 lb 11.2 oz (81.058 kg) Wt Readings from Last 3 Encounters:  08/28/15 178 lb 11.2 oz (81.058 kg)  07/18/15 182 lb (82.555 kg)  07/11/15 186 lb 12.8 oz (84.732 kg)

## 2015-08-28 NOTE — Progress Notes (Signed)
Radiation Oncology         [phone] ________________________________  Name: Scott Newman MRN: [id number]  Date: 08/28/2015  DOB: 1945/12/16  Follow-Up Visit Note  CC: Jani Gravel, MD  Franchot Gallo, MD  Diagnosis: 70 y.o. gentleman with stage T1c adenocarcinoma of the prostate with a Gleason's score of 4+3 and a PSA of 9.24.    ICD-9-CM ICD-10-CM   1. Malignant neoplasm of prostate (HCC) 185 C61     Interval Since Last Radiation: 1 months  05/27/2015-07/24/2015: The prostate was treated to 78 Gy in 40 fractions of 1.95 Gy  Narrative:  The patient returns today for routine follow-up.   On review of systems, she seems to feel as though his nutrition is improving and his energy is also slowly improving. His wife is trying to find foods that we'll settle well with him but reports that he has lost about 20 pounds in the last month and a half due to only taking in liquids out of concern that he would have diarrhea is eating all at foods. He continues to have diarrhea that is managed with Imodium and feels as though this is improved significantly. Fecal incontinence is also improved significantly. His urinary symptoms have also improved and he denies incontinence or hematuria though his urgency continues to be present. He has been experiencing cold intolerance and has been wearing more clothing though his wife states that their house is not anymore cool and temperature than usual. He denies any upper respiratory symptoms or fevers. A complete review of systems is obtained and is otherwise negative  ALLERGIES:  is allergic to acetaminophen.  Meds: Current Outpatient Prescriptions  Medication Sig Dispense Refill  . aspirin 325 MG tablet Take 325 mg by mouth as needed.      . Cholecalciferol (VITAMIN D-3 PO) Take by mouth.    . co-enzyme Q-10 30 MG capsule Take 30 mg by mouth daily.     Marland Kitchen levothyroxine (SYNTHROID, LEVOTHROID) 50 MCG tablet Take 50 mcg by mouth daily before  breakfast.    . lisinopril (PRINIVIL,ZESTRIL) 20 MG tablet TAKE 1 TABLET BY MOUTH EVERY DAY 90 tablet 2  . loperamide (IMODIUM A-D) 2 MG tablet Take 2 mg by mouth 4 (four) times daily as needed for diarrhea or loose stools.    . Multiple Vitamin (MULTI-VITAMIN PO) Take by mouth daily.      Marland Kitchen LINZESS 145 MCG CAPS capsule Take 145 mcg by mouth every other day. Reported on 08/28/2015     No current facility-administered medications for this encounter.    Physical Findings:  weight is 178 lb 11.2 oz (81.058 kg). His blood pressure is 116/62 and his pulse is 64. His respiration is 16.  Pain scale 0/10 In general this is a frail appearing Caucasian male in no acute distress. He's alert and oriented 4 and appropriate throughout the examination. Cardiopulmonary assessment reveals normal effort without evidence of acute distress. Skin is intact without evidence of excoriations or lesion.  Lab Findings: Lab Results  Component Value Date   WBC 8.4 07/29/2014   WBC 8.4 05/05/2011   HGB 13.0 07/29/2014   HCT 37.7 07/29/2014   PLT 255 07/29/2014    Lab Results  Component Value Date   NA 133* 07/29/2014   NA 132* 09/11/2012   K 4.4 07/29/2014   CO2 24 07/29/2014   GLUCOSE 85 07/29/2014   GLUCOSE 102* 09/11/2012   BUN 11 07/29/2014   BUN 12 09/11/2012   CREATININE 1.37* 07/29/2014  BILITOT 0.5 07/29/2014   ALKPHOS 52 07/29/2014   AST 74* 07/29/2014   ALT 22 07/29/2014   PROT 8.0 07/29/2014   PROT 7.9 04/30/2011   ALBUMIN 5.0* 07/29/2014   ALBUMIN 4.4 04/30/2011   CALCIUM 10.1 07/29/2014    Radiographic Findings: No results found.  Impression:  70 year old male status post external radiation to the prostate for T1c intermediate risk adenocarcinoma  Plan: The patient has already made plans to follow-up with Dr. Vernie Shanks towards the end of March 2017. He has plans to undergo bone scan and PSA assessment. Dr. Tammi Klippel discussed with the patient about postradiation expectations he may  experience in the future, including his risk for erectile dysfunction and rectal bleeding. We encouraged him to call or return to the office if he has any question about his previous radiation or possible radiation effects.  He was comfortable with this plan. With his symptoms of cold intolerance, I've advised him to contact his primary care provider his temperature though not recorded in this vital section of the chart was 94.1. I have advised him to make a follow-up visit with his primary provider for assessment of this as well.  The above documentation reflects my direct findings during this shared patient visit. Please see the separate note by Dr. Tammi Klippel on this date for the remainder of the patient's plan of care.  Carola Rhine, PAC   This document serves as a record of services personally performed by Shona Simpson, PAC and Tyler Pita, MD. It was created on their behalf by Darcus Austin, a trained medical scribe. The creation of this record is based on the scribe's personal observations and the provider's statements to them. This document has been checked and approved by the attending provider.

## 2015-08-29 NOTE — Patient Instructions (Signed)
Contact our office if you have any questions following today's appointment: [phone].  

## 2015-09-04 DIAGNOSIS — E559 Vitamin D deficiency, unspecified: Secondary | ICD-10-CM | POA: Diagnosis not present

## 2015-09-04 DIAGNOSIS — E039 Hypothyroidism, unspecified: Secondary | ICD-10-CM | POA: Diagnosis not present

## 2015-09-04 DIAGNOSIS — Z125 Encounter for screening for malignant neoplasm of prostate: Secondary | ICD-10-CM | POA: Diagnosis not present

## 2015-09-04 DIAGNOSIS — I10 Essential (primary) hypertension: Secondary | ICD-10-CM | POA: Diagnosis not present

## 2015-09-11 DIAGNOSIS — E039 Hypothyroidism, unspecified: Secondary | ICD-10-CM | POA: Diagnosis not present

## 2015-09-11 DIAGNOSIS — E559 Vitamin D deficiency, unspecified: Secondary | ICD-10-CM | POA: Diagnosis not present

## 2015-09-11 DIAGNOSIS — I10 Essential (primary) hypertension: Secondary | ICD-10-CM | POA: Diagnosis not present

## 2015-09-26 DIAGNOSIS — C61 Malignant neoplasm of prostate: Secondary | ICD-10-CM | POA: Diagnosis not present

## 2015-09-26 DIAGNOSIS — Z0000 Encounter for general adult medical examination without abnormal findings: Secondary | ICD-10-CM | POA: Diagnosis not present

## 2015-09-26 DIAGNOSIS — R8271 Bacteriuria: Secondary | ICD-10-CM | POA: Diagnosis not present

## 2015-12-26 DIAGNOSIS — C61 Malignant neoplasm of prostate: Secondary | ICD-10-CM | POA: Diagnosis not present

## 2016-01-09 DIAGNOSIS — C61 Malignant neoplasm of prostate: Secondary | ICD-10-CM | POA: Diagnosis not present

## 2016-01-29 ENCOUNTER — Other Ambulatory Visit: Payer: Self-pay | Admitting: Cardiology

## 2016-03-05 DIAGNOSIS — E039 Hypothyroidism, unspecified: Secondary | ICD-10-CM | POA: Diagnosis not present

## 2016-03-05 DIAGNOSIS — E559 Vitamin D deficiency, unspecified: Secondary | ICD-10-CM | POA: Diagnosis not present

## 2016-03-05 DIAGNOSIS — I10 Essential (primary) hypertension: Secondary | ICD-10-CM | POA: Diagnosis not present

## 2016-03-24 DIAGNOSIS — E039 Hypothyroidism, unspecified: Secondary | ICD-10-CM | POA: Diagnosis not present

## 2016-03-24 DIAGNOSIS — Z23 Encounter for immunization: Secondary | ICD-10-CM | POA: Diagnosis not present

## 2016-03-24 DIAGNOSIS — I10 Essential (primary) hypertension: Secondary | ICD-10-CM | POA: Diagnosis not present

## 2016-03-24 DIAGNOSIS — E7800 Pure hypercholesterolemia, unspecified: Secondary | ICD-10-CM | POA: Diagnosis not present

## 2016-03-25 ENCOUNTER — Other Ambulatory Visit: Payer: Self-pay | Admitting: Internal Medicine

## 2016-03-25 DIAGNOSIS — R221 Localized swelling, mass and lump, neck: Secondary | ICD-10-CM

## 2016-04-01 NOTE — Progress Notes (Signed)
s  HPI The patient presents for followup of hypertension and aortic stenosis which has been very mild. Since I  last saw him he has completed external beam radiation for prostate cancer.  This left him very weak and is only slowly recovering from this. He's lost over 30 pounds. Just sounds very effective. He denies any acute cardiovascular symptoms. The patient denies any new symptoms such as chest discomfort, neck or arm discomfort. There has been no new shortness of breath, PND or orthopnea. There have been no reported palpitations, presyncope or syncope.  Allergies  Allergen Reactions  . Acetaminophen Nausea And Vomiting    Current Outpatient Prescriptions  Medication Sig Dispense Refill  . aspirin 325 MG tablet Take 325 mg by mouth as needed.      . Cholecalciferol (VITAMIN D-3 PO) Take by mouth.    . co-enzyme Q-10 30 MG capsule Take 200 mg by mouth daily.     Marland Kitchen levothyroxine (SYNTHROID, LEVOTHROID) 50 MCG tablet Take 50 mcg by mouth daily before breakfast.    . LINZESS 145 MCG CAPS capsule Take 145 mcg by mouth every other day. Reported on 08/28/2015    . lisinopril (PRINIVIL,ZESTRIL) 20 MG tablet TAKE 1 TABLET BY MOUTH EVERY DAY 90 tablet 01  . Multiple Vitamin (MULTI-VITAMIN PO) Take by mouth daily.       No current facility-administered medications for this visit.     Past Medical History:  Diagnosis Date  . Aortic stenosis   . Arthritis   . Chronic diastolic heart failure ([place])    echocardiogram 12/11: EF 0000000, grade 1 diastolic dysfunction, mild aortic stenosis with a mean gradient of 6 mm of mercury, moderate mitral annular calcification, mild LAE, trivial pericardial effusion;    Myoview 12/11 (during admission for heart failure): No ischemia or scar, EF 63%.  . Colon polyp   . Depression   . Difficulty swallowing   . Gait abnormality   . Hernia    umbilical and inguinal  . History of alcohol abuse   . History of cellulitis and abscess   . HTN (hypertension)   .  Prostate cancer ([place])   . Slurred speech   . Thyroid disease     Past Surgical History:  Procedure Laterality Date  . COLON SURGERY  05/04/11   Lap assisted right colectomy  . HERNIA REPAIR     umbilical and inguinal- rt  . PROSTATE BIOPSY    . ROOT CANAL      ROS:    As stated in the HPI and negative for all other systems.  PHYSICAL EXAM BP 118/60   Pulse 63   Ht 5\' 9"  (1.753 m)   Wt 187 lb (84.8 kg)   BMI 27.62 kg/m  GENERAL:  Frail appearing NECK:  No jugular venous distention, waveform within normal limits, carotid upstroke brisk and symmetric, no bruits, no thyromegaly LUNGS:  Clear to auscultation bilaterally CHEST:  Unremarkable HEART:  PMI not displaced or sustained,S1 and S2 within normal limits, no S3, no S4, no clicks, no rubs, 2 out of 6 apical early to mid peaking systolic murmur radiating out the aortic outflow tract, no diastolic murmurs ABD:  Flat, positive bowel sounds normal in frequency in pitch, no bruits, no rebound, no guarding, no midline pulsatile mass, no hepatomegaly, no splenomegaly EXT:  2 plus pulses throughout, no edema, no cyanosis no clubbing  EKG:  Sinus rhythm, rate 63, axis within normal limits, intervals within normal limits, nonspecific T-wave flattening. 04/02/2016  ASSESSMENT AND PLAN  DIASTOLIC HF:  He seems to be euvolemic.  He is doing very well with his volume management so restriction. No change in therapy is indicated.  AS:  This has been mild no change in therapy or further imaging is indicated at this point.  I will likely repeat an echocardiogram when I see him next year.  HTN:  The patient's blood pressure is well controlled. He will continue meds as listed.

## 2016-04-02 ENCOUNTER — Encounter: Payer: Self-pay | Admitting: Cardiology

## 2016-04-02 ENCOUNTER — Ambulatory Visit (INDEPENDENT_AMBULATORY_CARE_PROVIDER_SITE_OTHER): Payer: Medicare Other | Admitting: Cardiology

## 2016-04-02 VITALS — BP 118/60 | HR 63 | Ht 69.0 in | Wt 187.0 lb

## 2016-04-02 DIAGNOSIS — R0602 Shortness of breath: Secondary | ICD-10-CM

## 2016-04-02 DIAGNOSIS — I10 Essential (primary) hypertension: Secondary | ICD-10-CM | POA: Diagnosis not present

## 2016-04-02 NOTE — Patient Instructions (Signed)
Medication Instructions:  Continue current medcations  Labwork: None Ordered  Testing/Procedures: None Ordered  Follow-Up: Your physician wants you to follow-up in: 1 Year. You will receive a reminder letter in the mail two months in advance. If you don't receive a letter, please call our office to schedule the follow-up appointment.   Any Other Special Instructions Will Be Listed Below (If Applicable).   If you need a refill on your cardiac medications before your next appointment, please call your pharmacy.   

## 2016-04-16 DIAGNOSIS — C61 Malignant neoplasm of prostate: Secondary | ICD-10-CM | POA: Diagnosis not present

## 2016-05-17 ENCOUNTER — Ambulatory Visit
Admission: RE | Admit: 2016-05-17 | Discharge: 2016-05-17 | Disposition: A | Discharge status: Home / Self Care | Payer: Medicare Other | Source: Ambulatory Visit | Attending: Internal Medicine | Admitting: Internal Medicine

## 2016-05-17 DIAGNOSIS — R221 Localized swelling, mass and lump, neck: Secondary | ICD-10-CM

## 2016-05-19 ENCOUNTER — Other Ambulatory Visit: Payer: Self-pay | Admitting: Internal Medicine

## 2016-05-19 DIAGNOSIS — R221 Localized swelling, mass and lump, neck: Secondary | ICD-10-CM

## 2016-05-24 ENCOUNTER — Ambulatory Visit
Admission: RE | Admit: 2016-05-24 | Discharge: 2016-05-24 | Disposition: A | Discharge status: Home / Self Care | Payer: Medicare Other | Source: Ambulatory Visit | Attending: Internal Medicine | Admitting: Internal Medicine

## 2016-05-24 DIAGNOSIS — R221 Localized swelling, mass and lump, neck: Secondary | ICD-10-CM | POA: Diagnosis not present

## 2016-05-24 MED ORDER — IOPAMIDOL (ISOVUE-300) INJECTION 61%
75.0000 mL | Freq: Once | INTRAVENOUS | Status: AC | PRN
  Administered 2016-05-24: 75 mL via INTRAVENOUS

## 2016-06-15 DIAGNOSIS — R221 Localized swelling, mass and lump, neck: Secondary | ICD-10-CM

## 2016-06-15 DIAGNOSIS — Z8546 Personal history of malignant neoplasm of prostate: Secondary | ICD-10-CM | POA: Insufficient documentation

## 2016-06-15 HISTORY — DX: Localized swelling, mass and lump, neck: R22.1

## 2016-06-24 ENCOUNTER — Ambulatory Visit: Payer: Self-pay | Admitting: Otolaryngology

## 2016-06-24 NOTE — H&P (Signed)
HPI:   Scott Newman is a 70 y.o. male who presents as a consult Patient.   Referring Provider: Venancio Poisson, *  Chief complaint: Neck mass.  HPI: He noticed a lump in his neck a few months ago. He denies any other head and neck symptoms. He has been treated for prostate cancer and is felt to be free of disease currently. He underwent a CT of the neck, which I have reviewed. It revealed a cystic mass, consistent with branchial cleft cyst. There were no other findings in the head and beck.  PMH/Meds/All/SocHx/FamHx/ROS:   Past Medical History:  Diagnosis Date  . Heart failure ([place])  . High cholesterol  . Hypertension  . Prostate cancer [facility])   Past Surgical History:  Procedure Laterality Date  . HERNIA REPAIR  . MOUTH SURGERY  . SMALL INTESTINE SURGERY  . TONSILLECTOMY   No family history of bleeding disorders, wound healing problems or difficulty with anesthesia.   Social History   Social History  . Marital status: N/A  Spouse name: N/A  . Number of children: N/A  . Years of education: N/A   Occupational History  . Not on file.   Social History Main Topics  . Smoking status: Never Smoker  . Smokeless tobacco: Never Used  . Alcohol use Not on file  . Drug use: Unknown  . Sexual activity: Not on file   Other Topics Concern  . Not on file   Social History Narrative  . No narrative on file   Current Outpatient Prescriptions:  . aspirin 325 MG tablet *ANTIPLATELET*, Take by mouth., Disp: , Rfl:  . CHOLECALCIFEROL, VITAMIN D3, ORAL, Take by mouth., Disp: , Rfl:  . co-enzyme Q-10 30 mg capsule, Take by mouth., Disp: , Rfl:  . linaclotide (LINACLOTIDE) 145 mcg capsule, Take 145 mcg by mouth., Disp: , Rfl:  . lisinopril (PRINIVIL,ZESTRIL) 20 MG tablet, , Disp: , Rfl:  . MULTIVITAMIN (MULTIPLE VITAMIN ORAL), Take by mouth., Disp: , Rfl:  . SYNTHROID 50 mcg tablet, , Disp: , Rfl:   A complete ROS was performed with pertinent positives/negatives noted in  the HPI. The remainder of the ROS are negative.   Physical Exam:   BP 129/72 (Site: Left arm, Position: Sitting)  Ht 1.753 m (5\' 9" )  Wt 81.6 kg (180 lb)  BMI 26.58 kg/m   General: Healthy and alert, in no distress, breathing easily. Normal affect. In a pleasant mood. Head: Normocephalic, atraumatic. No masses, or scars. Eyes: Pupils are equal, and reactive to light. Vision is grossly intact. No spontaneous or gaze nystagmus. Ears: Ear canals are clear. Tympanic membranes are intact, with normal landmarks and the middle ears are clear and healthy. Hearing: Grossly normal. Nose: Nasal cavities are clear with healthy mucosa, no polyps or exudate.Airways are patent. Face: No masses or scars, facial nerve function is symmetric. Oral Cavity: No mucosal abnormalities are noted. Tongue with normal mobility. Dentition appears healthy. Oropharynx: Tonsils are symmetric. There are no mucosal masses identified. Tongue base appears normal and healthy. Larynx/Hypopharynx: indirect exam reveals healthy, mobile vocal cords, without mucosal lesions in the hypopharynx or larynx. Chest: Deferred Neck: Left level 2 mass, approximately 3 cm, no thyroid nodules or enlargement. Neuro: Cranial nerves II-XII will normal function. Balance: Normal gate. Other findings: none.  Independent Review of Additional Tests or Records:  none  Procedures:  Procedure Note:  Indications for procedure: neck mass  Details of the procedure were discussed with the patient and all questions were answered.  Procedure:  2% xylocaine with epinephrine was infiltrated into the overlying skin. First pass was made with a 25 gauge needle and 10 cc syringe. Second pass was made with a 22 gauge needle. Specimen was placed on microscopic slides and air-dried. Additional material was placed in Cytolyte solution for cell block preparation. Only 2 needles were used. Straw colored fluid was obtained.  A bandage was applied.   He  tolerated the procedure well. Results will be discussed when available.  Impression & Plans:  Neck mass, possible branchial cleft cyst, although at his age, needs to be evaluated for a metastatic carcinoma node. FNA performed. We will discuss further workup or treatment when results are available.

## 2016-06-25 ENCOUNTER — Telehealth: Payer: Self-pay | Admitting: Cardiology

## 2016-06-25 NOTE — Telephone Encounter (Signed)
Pt's wife called to confirm to status of pt's cardiac clearance that has been faxed  On Monday?    Pt's wife would like to know if they need an appointment or not?

## 2016-06-25 NOTE — Telephone Encounter (Signed)
Neck cyst removal - under jaw Scheduled for 12/27 Pre-op testing 12/19 Dr. Constance Holster - surgeon   Advised wife will likely NOT need an appt as he was just seen 03/2016  Message routed to MD to address Wife would like call back with outcome

## 2016-06-27 NOTE — Telephone Encounter (Signed)
The patient is cleared for surgery.  No further testing is indicated.

## 2016-06-29 ENCOUNTER — Encounter (HOSPITAL_COMMUNITY): Payer: Self-pay

## 2016-06-29 ENCOUNTER — Encounter (HOSPITAL_COMMUNITY)
Admission: RE | Admit: 2016-06-29 | Discharge: 2016-06-29 | Disposition: A | Discharge status: Home / Self Care | Payer: Medicare Other | Source: Ambulatory Visit | Attending: Otolaryngology | Admitting: Otolaryngology

## 2016-06-29 DIAGNOSIS — Q180 Sinus, fistula and cyst of branchial cleft: Secondary | ICD-10-CM | POA: Diagnosis not present

## 2016-06-29 DIAGNOSIS — Z01812 Encounter for preprocedural laboratory examination: Secondary | ICD-10-CM | POA: Diagnosis not present

## 2016-06-29 HISTORY — DX: Hypothyroidism, unspecified: E03.9

## 2016-06-29 LAB — BASIC METABOLIC PANEL
Anion gap: 8 (ref 5–15)
BUN: 15 mg/dL (ref 6–20)
CHLORIDE: 97 mmol/L — AB (ref 101–111)
CO2: 26 mmol/L (ref 22–32)
Calcium: 10.1 mg/dL (ref 8.9–10.3)
Creatinine, Ser: 1.06 mg/dL (ref 0.61–1.24)
GFR calc Af Amer: >60 mL/min (ref >60)
GFR calc non Af Amer: >60 mL/min (ref >60)
GLUCOSE: 99 mg/dL (ref 65–99)
POTASSIUM: 4.8 mmol/L (ref 3.5–5.1)
SODIUM: 131 mmol/L — AB (ref 135–145)

## 2016-06-29 LAB — CBC
HEMATOCRIT: 37 % — AB (ref 39.0–52.0)
Hemoglobin: 12 g/dL — ABNORMAL LOW (ref 13.0–17.0)
MCH: 25.6 pg — ABNORMAL LOW (ref 26.0–34.0)
MCHC: 32.4 g/dL (ref 30.0–36.0)
MCV: 79.1 fL (ref 78.0–100.0)
Platelets: 271 10*3/uL (ref 150–400)
RBC: 4.68 MIL/uL (ref 4.22–5.81)
RDW: 16.9 % — AB (ref 11.5–15.5)
WBC: 6.2 10*3/uL (ref 4.0–10.5)

## 2016-06-29 LAB — NO BLOOD PRODUCTS

## 2016-06-29 NOTE — Pre-Procedure Instructions (Addendum)
Scott Newman  06/29/2016      [facility] [place], [address] [place] [address] Phone: [phone] Fax: [phone]    Your procedure is scheduled on  Wednesday, July 07, 2016  Report to [facility] Admitting at 10;00 A.M.  Call this number if you have problems the morning of surgery:  [phone]   Remember:  Do not eat food or drink liquids after midnight Tuesday, July 06, 2016   Take these medicines the morning of surgery with A SIP OF WATER levothyroxine (SYNTHROID, LEVOTHROID), LINZESS,  if needed: Visine eye drops Stop taking Aspirin, vitamins, fish oil and herbal medications such as Coenzyme Q10 (COQ10) . Do not take any NSAIDs ie: Ibuprofen, Advil, Naproxen, BC and Goody Powder; stop now.  Do not wear jewelry.  Do not wear lotions, powders, or cologne, or deoderant.  Men may shave face and neck.  Do not bring valuables to the hospital.  [facility] is not responsible for any belongings or valuables.  Contacts, dentures or bridgework may not be worn into surgery.  Leave your suitcase in the car.  After surgery it may be brought to your room.  For patients admitted to the hospital, discharge time will be determined by your treatment team.  Patients discharged the day of surgery will not be allowed to drive home.    Special instructions:   [facility]- Preparing For Surgery  Before surgery, you can play an important role. Because skin is not sterile, your skin needs to be as free of germs as possible. You can reduce the number of germs on your skin by washing with CHG (chlorahexidine gluconate) Soap before surgery.  CHG is an antiseptic cleaner which kills germs and bonds with the skin to continue killing germs even after washing.  Please do not use if you have an allergy to CHG or antibacterial soaps. If your skin becomes reddened/irritated stop using the  CHG.  Do not shave (including legs and underarms) for at least 48 hours prior to first CHG shower. It is OK to shave your face.  Please follow these instructions carefully.   1. Shower the NIGHT BEFORE SURGERY and the MORNING OF SURGERY with CHG.   2. If you chose to wash your hair, wash your hair first as usual with your normal shampoo.  3. After you shampoo, rinse your hair and body thoroughly to remove the shampoo.  4. Use CHG as you would any other liquid soap. You can apply CHG directly to the skin and wash gently with a scrungie or a clean washcloth.   5. Apply the CHG Soap to your body ONLY FROM THE NECK DOWN.  Do not use on open wounds or open sores. Avoid contact with your eyes, ears, mouth and genitals (private parts). Wash genitals (private parts) with your normal soap.  6. Wash thoroughly, paying special attention to the area where your surgery will be performed.  7. Thoroughly rinse your body with warm water from the neck down.  8. DO NOT shower/wash with your normal soap after using and rinsing off the CHG Soap.  9. Pat yourself dry with a CLEAN TOWEL.   10. Wear CLEAN PAJAMAS   11. Place CLEAN SHEETS on your bed the night of your first shower and DO NOT SLEEP WITH PETS.    Day of Surgery: Do not apply any deodorants/lotions. Please  wear clean clothes to the hospital/surgery center.      Please read over the following fact sheets that you were given.

## 2016-06-29 NOTE — Progress Notes (Signed)
Pt denies SOB and chest pain but is under the care of Dr. Percival Spanish , Cardiology. Pt denies having a cardiac cath. Pt denies having a chest x ray within the last year. Pt denies having recent labs. Pt chart forwarded to anesthesia to review cardiac history and clearance note on chart.

## 2016-06-30 ENCOUNTER — Other Ambulatory Visit (HOSPITAL_COMMUNITY): Payer: Medicare Other

## 2016-06-30 ENCOUNTER — Other Ambulatory Visit: Payer: Self-pay | Admitting: Cardiology

## 2016-06-30 NOTE — Progress Notes (Signed)
Anesthesia Chart Review:  Pt is a 70 year old male scheduled for L branchial cleft cyst excision on 07/07/2016 with Izora Gala, MD.   Pt refuses blood products.   - Cardiologist is Minus Breeding, MD, who has cleared pt for surgery. Last office visit 04/02/16.  - PCP is Jani Gravel, MD  PMH includes:  Chronic diastolic HF, aortic stenosis, HTN, hypothyroidism, hx alcohol abuse, prostate cancer. Former smoker. BMI 29  Medications include: ASA, levothyroxine  Preoperative labs reviewed.    EKG 04/02/16: NSR. Possible anterior infarct, age undetermined  Carotid duplex 10/31/14:  - 50-69% stenosis in the right internal carotid artery. - Less than 50% stenosis in the left internal carotid artery.  Echo 06/22/10:  - Left ventricle: The cavity size was normal. Systolic function was normal. The estimated ejection fraction was in the range of 55% to 60%. Regional wall motion abnormalities cannot be excluded. Doppler parameters are consistent with abnormal left ventricular relaxation (grade 1 diastolic dysfunction). - Aortic valve: Mildly calcified annulus. There was mild stenosis. - Mitral valve: Moderately calcified annulus. - Left atrium: The atrium was mildly dilated. - Atrial septum: No defect or patent foramen ovale was identified. - Pericardium, extracardiac: A trivial pericardial effusion was identified. Features were not consistent with tamponade physiology.  If no changes, I anticipate pt can proceed with surgery as scheduled.   Willeen Cass, FNP-BC [facility]/Anesthesiology Phone: [phone] 06/30/2016 12:26 PM

## 2016-07-01 NOTE — Telephone Encounter (Signed)
Clearance faxed to [employer] office via [employer]

## 2016-07-07 ENCOUNTER — Encounter (HOSPITAL_COMMUNITY): Payer: Self-pay | Admitting: Surgery

## 2016-07-07 ENCOUNTER — Ambulatory Visit (HOSPITAL_COMMUNITY)
Admission: RE | Admit: 2016-07-07 | Discharge: 2016-07-07 | Disposition: A | Discharge status: Home / Self Care | Payer: Medicare Other | Source: Ambulatory Visit | Attending: Otolaryngology | Admitting: Otolaryngology

## 2016-07-07 ENCOUNTER — Ambulatory Visit (HOSPITAL_COMMUNITY): Payer: Medicare Other | Admitting: Anesthesiology

## 2016-07-07 ENCOUNTER — Ambulatory Visit (HOSPITAL_COMMUNITY): Payer: Medicare Other | Admitting: Emergency Medicine

## 2016-07-07 ENCOUNTER — Encounter (HOSPITAL_COMMUNITY)
Admission: RE | Disposition: A | Discharge status: Home / Self Care | Payer: Self-pay | Source: Ambulatory Visit | Attending: Otolaryngology

## 2016-07-07 DIAGNOSIS — Z7982 Long term (current) use of aspirin: Secondary | ICD-10-CM | POA: Insufficient documentation

## 2016-07-07 DIAGNOSIS — I739 Peripheral vascular disease, unspecified: Secondary | ICD-10-CM | POA: Insufficient documentation

## 2016-07-07 DIAGNOSIS — Q180 Sinus, fistula and cyst of branchial cleft: Secondary | ICD-10-CM | POA: Insufficient documentation

## 2016-07-07 DIAGNOSIS — Z8546 Personal history of malignant neoplasm of prostate: Secondary | ICD-10-CM | POA: Insufficient documentation

## 2016-07-07 DIAGNOSIS — Z79899 Other long term (current) drug therapy: Secondary | ICD-10-CM | POA: Diagnosis not present

## 2016-07-07 DIAGNOSIS — Z87891 Personal history of nicotine dependence: Secondary | ICD-10-CM | POA: Diagnosis not present

## 2016-07-07 DIAGNOSIS — I10 Essential (primary) hypertension: Secondary | ICD-10-CM | POA: Insufficient documentation

## 2016-07-07 DIAGNOSIS — M1990 Unspecified osteoarthritis, unspecified site: Secondary | ICD-10-CM | POA: Diagnosis not present

## 2016-07-07 HISTORY — PX: EAR CYST EXCISION: SHX22

## 2016-07-07 SURGERY — EXCISION, BRANCHIAL CLEFT CYST
Anesthesia: General | Laterality: Left

## 2016-07-07 MED ORDER — LIDOCAINE HCL (CARDIAC) 20 MG/ML IV SOLN
INTRAVENOUS | Status: DC | PRN
  Administered 2016-07-07: 100 mg via INTRAVENOUS

## 2016-07-07 MED ORDER — PHENYLEPHRINE 40 MCG/ML (10ML) SYRINGE FOR IV PUSH (FOR BLOOD PRESSURE SUPPORT)
PREFILLED_SYRINGE | INTRAVENOUS | Status: AC
  Filled 2016-07-07: qty 10

## 2016-07-07 MED ORDER — PROMETHAZINE HCL 25 MG/ML IJ SOLN: 6.2500 mg | INTRAMUSCULAR | Status: DC | PRN

## 2016-07-07 MED ORDER — ONDANSETRON HCL 4 MG/2ML IJ SOLN
INTRAMUSCULAR | Status: AC
  Filled 2016-07-07: qty 4

## 2016-07-07 MED ORDER — ROCURONIUM BROMIDE 100 MG/10ML IV SOLN
INTRAVENOUS | Status: DC | PRN
  Administered 2016-07-07: 40 mg via INTRAVENOUS

## 2016-07-07 MED ORDER — BACITRACIN ZINC 500 UNIT/GM EX OINT
TOPICAL_OINTMENT | CUTANEOUS | Status: AC
  Filled 2016-07-07: qty 28.35

## 2016-07-07 MED ORDER — ROCURONIUM BROMIDE 50 MG/5ML IV SOSY
PREFILLED_SYRINGE | INTRAVENOUS | Status: AC
  Filled 2016-07-07: qty 5

## 2016-07-07 MED ORDER — CEPHALEXIN 500 MG PO CAPS: 500.0000 mg | ORAL_CAPSULE | Freq: Three times a day (TID) | ORAL | 0 refills | Status: AC

## 2016-07-07 MED ORDER — PHENYLEPHRINE HCL 10 MG/ML IJ SOLN
INTRAVENOUS | Status: DC | PRN
  Administered 2016-07-07: 50 ug/min via INTRAVENOUS

## 2016-07-07 MED ORDER — FENTANYL CITRATE (PF) 100 MCG/2ML IJ SOLN
INTRAMUSCULAR | Status: AC
  Filled 2016-07-07: qty 2

## 2016-07-07 MED ORDER — MEPERIDINE HCL 25 MG/ML IJ SOLN: 6.2500 mg | INTRAMUSCULAR | Status: DC | PRN

## 2016-07-07 MED ORDER — SUGAMMADEX SODIUM 200 MG/2ML IV SOLN
INTRAVENOUS | Status: DC | PRN
  Administered 2016-07-07: 200 mg via INTRAVENOUS

## 2016-07-07 MED ORDER — HYDROCODONE-ACETAMINOPHEN 7.5-325 MG PO TABS: 1.0000 | ORAL_TABLET | Freq: Four times a day (QID) | ORAL | 0 refills | Status: DC | PRN

## 2016-07-07 MED ORDER — CEFAZOLIN SODIUM-DEXTROSE 2-4 GM/100ML-% IV SOLN
INTRAVENOUS | Status: AC
  Filled 2016-07-07: qty 100

## 2016-07-07 MED ORDER — FENTANYL CITRATE (PF) 100 MCG/2ML IJ SOLN
INTRAMUSCULAR | Status: DC | PRN
  Administered 2016-07-07: 100 ug via INTRAVENOUS

## 2016-07-07 MED ORDER — SUGAMMADEX SODIUM 200 MG/2ML IV SOLN
INTRAVENOUS | Status: AC
  Filled 2016-07-07: qty 2

## 2016-07-07 MED ORDER — PROMETHAZINE HCL 25 MG RE SUPP: 25.0000 mg | Freq: Four times a day (QID) | RECTAL | 1 refills | Status: DC | PRN

## 2016-07-07 MED ORDER — ONDANSETRON HCL 4 MG/2ML IJ SOLN
INTRAMUSCULAR | Status: DC | PRN
  Administered 2016-07-07: 4 mg via INTRAVENOUS

## 2016-07-07 MED ORDER — CEFAZOLIN SODIUM-DEXTROSE 2-4 GM/100ML-% IV SOLN
2.0000 g | INTRAVENOUS | Status: AC
  Administered 2016-07-07: 2 g via INTRAVENOUS

## 2016-07-07 MED ORDER — PROPOFOL 10 MG/ML IV BOLUS
INTRAVENOUS | Status: AC
  Filled 2016-07-07: qty 20

## 2016-07-07 MED ORDER — 0.9 % SODIUM CHLORIDE (POUR BTL) OPTIME
TOPICAL | Status: DC | PRN
  Administered 2016-07-07: 1000 mL

## 2016-07-07 MED ORDER — HYDROMORPHONE HCL 1 MG/ML IJ SOLN: 0.2500 mg | INTRAMUSCULAR | Status: DC | PRN

## 2016-07-07 MED ORDER — LIDOCAINE 2% (20 MG/ML) 5 ML SYRINGE
INTRAMUSCULAR | Status: AC
  Filled 2016-07-07: qty 5

## 2016-07-07 MED ORDER — PHENYLEPHRINE HCL 10 MG/ML IJ SOLN
INTRAMUSCULAR | Status: DC | PRN
  Administered 2016-07-07: 80 ug via INTRAVENOUS
  Administered 2016-07-07: 40 ug via INTRAVENOUS

## 2016-07-07 MED ORDER — PROPOFOL 10 MG/ML IV BOLUS
INTRAVENOUS | Status: DC | PRN
  Administered 2016-07-07: 130 mg via INTRAVENOUS

## 2016-07-07 MED ORDER — LACTATED RINGERS IV SOLN
INTRAVENOUS | Status: DC
  Administered 2016-07-07: 10:00:00 via INTRAVENOUS

## 2016-07-07 MED ORDER — ONDANSETRON HCL 4 MG/2ML IJ SOLN
INTRAMUSCULAR | Status: AC
  Filled 2016-07-07: qty 2

## 2016-07-07 SURGICAL SUPPLY — 34 items
ADH SKN CLS APL DERMABOND .7 (GAUZE/BANDAGES/DRESSINGS) ×1
CANISTER SUCTION 2500CC (MISCELLANEOUS) ×3 IMPLANT
CLEANER TIP ELECTROSURG 2X2 (MISCELLANEOUS) ×3 IMPLANT
CONT SPEC 4OZ CLIKSEAL STRL BL (MISCELLANEOUS) ×1 IMPLANT
CORDS BIPOLAR (ELECTRODE) ×2 IMPLANT
COVER SURGICAL LIGHT HANDLE (MISCELLANEOUS) ×3 IMPLANT
CRADLE DONUT ADULT HEAD (MISCELLANEOUS) ×3 IMPLANT
DERMABOND ADVANCED (GAUZE/BANDAGES/DRESSINGS) ×2
DERMABOND ADVANCED .7 DNX12 (GAUZE/BANDAGES/DRESSINGS) IMPLANT
ELECT COATED BLADE 2.86 ST (ELECTRODE) ×3 IMPLANT
ELECT REM PT RETURN 9FT ADLT (ELECTROSURGICAL) ×3
ELECTRODE REM PT RTRN 9FT ADLT (ELECTROSURGICAL) ×1 IMPLANT
FORCEPS BIPOLAR SPETZLER 8 1.0 (NEUROSURGERY SUPPLIES) ×2 IMPLANT
GAUZE SPONGE 4X4 16PLY XRAY LF (GAUZE/BANDAGES/DRESSINGS) ×3 IMPLANT
GLOVE BIO SURGEON STRL SZ 6.5 (GLOVE) ×1 IMPLANT
GLOVE BIO SURGEONS STRL SZ 6.5 (GLOVE) ×1
GLOVE ECLIPSE 7.5 STRL STRAW (GLOVE) ×4 IMPLANT
GOWN STRL REUS W/ TWL LRG LVL3 (GOWN DISPOSABLE) ×3 IMPLANT
GOWN STRL REUS W/TWL LRG LVL3 (GOWN DISPOSABLE) ×9
KIT BASIN OR (CUSTOM PROCEDURE TRAY) ×3 IMPLANT
KIT ROOM TURNOVER OR (KITS) ×3 IMPLANT
NDL PRECISIONGLIDE 27X1.5 (NEEDLE) IMPLANT
NEEDLE PRECISIONGLIDE 27X1.5 (NEEDLE) IMPLANT
NS IRRIG 1000ML POUR BTL (IV SOLUTION) ×3 IMPLANT
PAD ARMBOARD 7.5X6 YLW CONV (MISCELLANEOUS) ×4 IMPLANT
PENCIL FOOT CONTROL (ELECTRODE) ×3 IMPLANT
STAPLER VISISTAT 35W (STAPLE) ×3 IMPLANT
SUT CHROMIC 3 0 SH 27 (SUTURE) IMPLANT
SUT CHROMIC 4 0 PS 2 18 (SUTURE) ×6 IMPLANT
SUT ETHILON 5 0 P 3 18 (SUTURE)
SUT NYLON ETHILON 5-0 P-3 1X18 (SUTURE) IMPLANT
SUT SILK 4 0 REEL (SUTURE) ×3 IMPLANT
TOWEL OR 17X24 6PK STRL BLUE (TOWEL DISPOSABLE) ×3 IMPLANT
TRAY ENT MC OR (CUSTOM PROCEDURE TRAY) ×3 IMPLANT

## 2016-07-07 NOTE — Transfer of Care (Signed)
Immediate Anesthesia Transfer of Care Note  Patient: Scott Newman  Procedure(s) Performed: Procedure(s): BRANCHIAL CLEFT CYST EXCISION (Left)  Patient Location: PACU  Anesthesia Type:General  Level of Consciousness: awake, alert  and oriented  Airway & Oxygen Therapy: Patient Spontanous Breathing and Patient connected to nasal cannula oxygen  Post-op Assessment: Report given to RN, Post -op Vital signs reviewed and stable and Patient moving all extremities  Post vital signs: Reviewed and stable  Last Vitals:  Vitals:   07/07/16 1013 07/07/16 1320  BP: (!) 148/61   Pulse: 70   Resp: 18   Temp: 36.6 C (P) 36.4 C    Last Pain:  Vitals:   07/07/16 1013  TempSrc: Oral      Patients Stated Pain Goal: 3 ([id number])  Complications: No apparent anesthesia complications

## 2016-07-07 NOTE — Anesthesia Procedure Notes (Signed)
Procedure Name: Intubation Date/Time: 07/07/2016 12:10 PM Performed by: Rush Farmer E Pre-anesthesia Checklist: Patient identified, Emergency Drugs available, Suction available and Patient being monitored Patient Re-evaluated:Patient Re-evaluated prior to inductionOxygen Delivery Method: Circle system utilized Preoxygenation: Pre-oxygenation with 100% oxygen Intubation Type: IV induction Ventilation: Mask ventilation without difficulty Laryngoscope Size: Mac and 4 Grade View: Grade II Tube type: Oral Tube size: 7.5 mm Number of attempts: 1 Airway Equipment and Method: Stylet Placement Confirmation: ETT inserted through vocal cords under direct vision,  positive ETCO2 and breath sounds checked- equal and bilateral Secured at: 22 cm Dental Injury: Teeth and Oropharynx as per pre-operative assessment

## 2016-07-07 NOTE — Op Note (Signed)
OPERATIVE REPORT  DATE OF SURGERY: 07/07/2016  PATIENT:  Scott Newman,  70 y.o. male  PRE-OPERATIVE DIAGNOSIS:  BRANCHIAL CLEFT CYST  POST-OPERATIVE DIAGNOSIS:  BRANCHIAL CLEFT CYST  PROCEDURE:  Procedure(s): BRANCHIAL CLEFT CYST EXCISION  SURGEON:  Beckie Salts, MD  ASSISTANTS: Jolene Provost, PA  ANESTHESIA:   General   EBL:  20 ml  DRAINS: None  LOCAL MEDICATIONS USED:  None  SPECIMEN:  Left level II neck mass  COUNTS:  Correct  PROCEDURE DETAILS: The patient was taken to the operating room and placed on the operating table in the supine position. Following induction of general endotracheal anesthesia, the left neck was prepped and draped in a standard fashion. A transverse incision was outlined with a marking pen about 2 fingerbreadths below the angle of the mandible. Electrocautery was used to incise the skin and subcutaneous tissue. Blunt dissection deep to this layer, anterior to the sternocleidomastoid muscle and just inferior to the tail of the parotid gland revealed a cystic mass containing a very dark fluid that was watery. The dissection this fluid was inadvertently drained from the mass. There is some surrounding adenopathy and the soft tissue surrounding where the cyst was felt to be contained was all removed. The digastric muscle was identified and preserved. The internal jugular vein was identified and preserved. The facial nerve was not identified and was felt to be superior to the level of dissection. The specimen was sent for pathologic evaluation. The wound was irrigated with saline and bipolar cautery was used to complete hemostasis. 4-0 silk ties were used as needed on larger vessels. The wound was closed in layers using 4-0 chromic on the deeper layers and a subcuticular running 4-0 chromic closure was accomplished on the skin. Dermabond was then placed on the surface. The patient was awakened extubated and transferred to recovery in stable  condition.    PATIENT DISPOSITION:  To PACU, stable

## 2016-07-07 NOTE — Progress Notes (Signed)
Report given to taylor rn as caregiver 

## 2016-07-07 NOTE — Anesthesia Preprocedure Evaluation (Addendum)
Anesthesia Evaluation  Patient identified by MRN, date of birth, ID band Patient awake    Reviewed: Allergy & Precautions, NPO status , Patient's Chart, lab work & pertinent test results  Airway Mallampati: I  TM Distance: >3 FB Neck ROM: Full    Dental no notable dental hx. (+) Teeth Intact   Pulmonary former smoker,    Pulmonary exam normal breath sounds clear to auscultation       Cardiovascular hypertension, Pt. on medications + Peripheral Vascular Disease  Normal cardiovascular exam+ Valvular Problems/Murmurs AS  Rhythm:Regular Rate:Normal + Systolic murmurs    Neuro/Psych PSYCHIATRIC DISORDERS Depression negative neurological ROS     GI/Hepatic negative GI ROS, Neg liver ROS,   Endo/Other  negative endocrine ROSHypothyroidism   Renal/GU negative Renal ROS     Musculoskeletal negative musculoskeletal ROS (+) Arthritis ,   Abdominal   Peds  Hematology negative hematology ROS (+)   Anesthesia Other Findings   Reproductive/Obstetrics                            Anesthesia Physical Anesthesia Plan  ASA: III  Anesthesia Plan: General   Post-op Pain Management:    Induction: Intravenous  Airway Management Planned: Oral ETT  Additional Equipment:   Intra-op Plan:   Post-operative Plan: Extubation in OR  Informed Consent: I have reviewed the patients History and Physical, chart, labs and discussed the procedure including the risks, benefits and alternatives for the proposed anesthesia with the patient or authorized representative who has indicated his/her understanding and acceptance.   Dental advisory given  Plan Discussed with: CRNA  Anesthesia Plan Comments:         Anesthesia Quick Evaluation

## 2016-07-07 NOTE — H&P (View-Only) (Signed)
HPI:   Scott Newman is a 70 y.o. male who presents as a consult Patient.   Referring Provider: Venancio Poisson, *  Chief complaint: Neck mass.  HPI: He noticed a lump in his neck a few months ago. He denies any other head and neck symptoms. He has been treated for prostate cancer and is felt to be free of disease currently. He underwent a CT of the neck, which I have reviewed. It revealed a cystic mass, consistent with branchial cleft cyst. There were no other findings in the head and beck.  PMH/Meds/All/SocHx/FamHx/ROS:   Past Medical History:  Diagnosis Date  . Heart failure ([place])  . High cholesterol  . Hypertension  . Prostate cancer [facility])   Past Surgical History:  Procedure Laterality Date  . HERNIA REPAIR  . MOUTH SURGERY  . SMALL INTESTINE SURGERY  . TONSILLECTOMY   No family history of bleeding disorders, wound healing problems or difficulty with anesthesia.   Social History   Social History  . Marital status: N/A  Spouse name: N/A  . Number of children: N/A  . Years of education: N/A   Occupational History  . Not on file.   Social History Main Topics  . Smoking status: Never Smoker  . Smokeless tobacco: Never Used  . Alcohol use Not on file  . Drug use: Unknown  . Sexual activity: Not on file   Other Topics Concern  . Not on file   Social History Narrative  . No narrative on file   Current Outpatient Prescriptions:  . aspirin 325 MG tablet *ANTIPLATELET*, Take by mouth., Disp: , Rfl:  . CHOLECALCIFEROL, VITAMIN D3, ORAL, Take by mouth., Disp: , Rfl:  . co-enzyme Q-10 30 mg capsule, Take by mouth., Disp: , Rfl:  . linaclotide (LINACLOTIDE) 145 mcg capsule, Take 145 mcg by mouth., Disp: , Rfl:  . lisinopril (PRINIVIL,ZESTRIL) 20 MG tablet, , Disp: , Rfl:  . MULTIVITAMIN (MULTIPLE VITAMIN ORAL), Take by mouth., Disp: , Rfl:  . SYNTHROID 50 mcg tablet, , Disp: , Rfl:   A complete ROS was performed with pertinent positives/negatives noted in  the HPI. The remainder of the ROS are negative.   Physical Exam:   BP 129/72 (Site: Left arm, Position: Sitting)  Ht 1.753 m (5\' 9" )  Wt 81.6 kg (180 lb)  BMI 26.58 kg/m   General: Healthy and alert, in no distress, breathing easily. Normal affect. In a pleasant mood. Head: Normocephalic, atraumatic. No masses, or scars. Eyes: Pupils are equal, and reactive to light. Vision is grossly intact. No spontaneous or gaze nystagmus. Ears: Ear canals are clear. Tympanic membranes are intact, with normal landmarks and the middle ears are clear and healthy. Hearing: Grossly normal. Nose: Nasal cavities are clear with healthy mucosa, no polyps or exudate.Airways are patent. Face: No masses or scars, facial nerve function is symmetric. Oral Cavity: No mucosal abnormalities are noted. Tongue with normal mobility. Dentition appears healthy. Oropharynx: Tonsils are symmetric. There are no mucosal masses identified. Tongue base appears normal and healthy. Larynx/Hypopharynx: indirect exam reveals healthy, mobile vocal cords, without mucosal lesions in the hypopharynx or larynx. Chest: Deferred Neck: Left level 2 mass, approximately 3 cm, no thyroid nodules or enlargement. Neuro: Cranial nerves II-XII will normal function. Balance: Normal gate. Other findings: none.  Independent Review of Additional Tests or Records:  none  Procedures:  Procedure Note:  Indications for procedure: neck mass  Details of the procedure were discussed with the patient and all questions were answered.  Procedure:  2% xylocaine with epinephrine was infiltrated into the overlying skin. First pass was made with a 25 gauge needle and 10 cc syringe. Second pass was made with a 22 gauge needle. Specimen was placed on microscopic slides and air-dried. Additional material was placed in Cytolyte solution for cell block preparation. Only 2 needles were used. Straw colored fluid was obtained.  A bandage was applied.   He  tolerated the procedure well. Results will be discussed when available.  Impression & Plans:  Neck mass, possible branchial cleft cyst, although at his age, needs to be evaluated for a metastatic carcinoma node. FNA performed. We will discuss further workup or treatment when results are available.

## 2016-07-07 NOTE — Discharge Instructions (Signed)
You may shower and use soap and water. Do not use any creams, oils or ointment. ° °

## 2016-07-07 NOTE — Interval H&P Note (Signed)
History and Physical Interval Note:  07/07/2016 11:49 AM  Scott Newman  has presented today for surgery, with the diagnosis of BRANCHIAL CLEFT CYST  The various methods of treatment have been discussed with the patient and family. After consideration of risks, benefits and other options for treatment, the patient has consented to  Procedure(s): BRANCHIAL CLEFT CYST EXCISION (Left) as a surgical intervention .  The patient's history has been reviewed, patient examined, no change in status, stable for surgery.  I have reviewed the patient's chart and labs.  Questions were answered to the patient's satisfaction.     [name]

## 2016-07-08 ENCOUNTER — Encounter (HOSPITAL_COMMUNITY): Payer: Self-pay | Admitting: Otolaryngology

## 2016-07-08 NOTE — Anesthesia Postprocedure Evaluation (Signed)
Anesthesia Post Note  Patient: Scott Newman  Procedure(s) Performed: Procedure(s) (LRB): BRANCHIAL CLEFT CYST EXCISION (Left)  Patient location during evaluation: PACU Anesthesia Type: General Level of consciousness: sedated and patient cooperative Pain management: pain level controlled Vital Signs Assessment: post-procedure vital signs reviewed and stable Respiratory status: spontaneous breathing Cardiovascular status: stable Anesthetic complications: no       Last Vitals:  Vitals:   07/07/16 1400 07/07/16 1415  BP: 128/74 129/62  Pulse: 69 67  Resp: 14 13  Temp:  36.4 C    Last Pain:  Vitals:   07/07/16 1345  TempSrc:   PainSc: [place]

## 2016-07-27 DIAGNOSIS — Q180 Sinus, fistula and cyst of branchial cleft: Secondary | ICD-10-CM | POA: Insufficient documentation

## 2016-08-19 DIAGNOSIS — C61 Malignant neoplasm of prostate: Secondary | ICD-10-CM | POA: Diagnosis not present

## 2016-08-25 DIAGNOSIS — C61 Malignant neoplasm of prostate: Secondary | ICD-10-CM | POA: Diagnosis not present

## 2016-08-25 DIAGNOSIS — R3915 Urgency of urination: Secondary | ICD-10-CM | POA: Diagnosis not present

## 2016-08-25 DIAGNOSIS — N3281 Overactive bladder: Secondary | ICD-10-CM | POA: Diagnosis not present

## 2016-10-14 DIAGNOSIS — E559 Vitamin D deficiency, unspecified: Secondary | ICD-10-CM | POA: Diagnosis not present

## 2016-10-14 DIAGNOSIS — Z79899 Other long term (current) drug therapy: Secondary | ICD-10-CM | POA: Diagnosis not present

## 2016-10-14 DIAGNOSIS — Z23 Encounter for immunization: Secondary | ICD-10-CM | POA: Diagnosis not present

## 2016-10-14 DIAGNOSIS — E039 Hypothyroidism, unspecified: Secondary | ICD-10-CM | POA: Diagnosis not present

## 2016-10-14 DIAGNOSIS — I10 Essential (primary) hypertension: Secondary | ICD-10-CM | POA: Diagnosis not present

## 2016-10-14 DIAGNOSIS — Z0000 Encounter for general adult medical examination without abnormal findings: Secondary | ICD-10-CM | POA: Diagnosis not present

## 2016-10-14 DIAGNOSIS — Z5181 Encounter for therapeutic drug level monitoring: Secondary | ICD-10-CM | POA: Diagnosis not present

## 2017-03-10 ENCOUNTER — Inpatient Hospital Stay (HOSPITAL_COMMUNITY)
Admission: EM | Admit: 2017-03-10 | Discharge: 2017-03-18 | DRG: 393 | Disposition: A | Discharge status: Home Health | Payer: Medicare Other | Attending: Internal Medicine | Admitting: Internal Medicine

## 2017-03-10 ENCOUNTER — Emergency Department (HOSPITAL_COMMUNITY): Payer: Medicare Other

## 2017-03-10 ENCOUNTER — Encounter (HOSPITAL_COMMUNITY): Payer: Self-pay | Admitting: Emergency Medicine

## 2017-03-10 DIAGNOSIS — D500 Iron deficiency anemia secondary to blood loss (chronic): Secondary | ICD-10-CM | POA: Diagnosis not present

## 2017-03-10 DIAGNOSIS — Z79899 Other long term (current) drug therapy: Secondary | ICD-10-CM | POA: Diagnosis not present

## 2017-03-10 DIAGNOSIS — Z87891 Personal history of nicotine dependence: Secondary | ICD-10-CM

## 2017-03-10 DIAGNOSIS — I5033 Acute on chronic diastolic (congestive) heart failure: Secondary | ICD-10-CM | POA: Diagnosis not present

## 2017-03-10 DIAGNOSIS — K317 Polyp of stomach and duodenum: Secondary | ICD-10-CM | POA: Diagnosis present

## 2017-03-10 DIAGNOSIS — F329 Major depressive disorder, single episode, unspecified: Secondary | ICD-10-CM | POA: Diagnosis present

## 2017-03-10 DIAGNOSIS — E063 Autoimmune thyroiditis: Secondary | ICD-10-CM | POA: Diagnosis present

## 2017-03-10 DIAGNOSIS — I10 Essential (primary) hypertension: Secondary | ICD-10-CM

## 2017-03-10 DIAGNOSIS — E039 Hypothyroidism, unspecified: Secondary | ICD-10-CM | POA: Diagnosis not present

## 2017-03-10 DIAGNOSIS — Z923 Personal history of irradiation: Secondary | ICD-10-CM | POA: Diagnosis not present

## 2017-03-10 DIAGNOSIS — D62 Acute posthemorrhagic anemia: Secondary | ICD-10-CM | POA: Diagnosis not present

## 2017-03-10 DIAGNOSIS — D72829 Elevated white blood cell count, unspecified: Secondary | ICD-10-CM | POA: Diagnosis present

## 2017-03-10 DIAGNOSIS — E871 Hypo-osmolality and hyponatremia: Secondary | ICD-10-CM | POA: Diagnosis not present

## 2017-03-10 DIAGNOSIS — I5032 Chronic diastolic (congestive) heart failure: Secondary | ICD-10-CM

## 2017-03-10 DIAGNOSIS — K922 Gastrointestinal hemorrhage, unspecified: Secondary | ICD-10-CM

## 2017-03-10 DIAGNOSIS — Z8546 Personal history of malignant neoplasm of prostate: Secondary | ICD-10-CM | POA: Diagnosis not present

## 2017-03-10 DIAGNOSIS — I350 Nonrheumatic aortic (valve) stenosis: Secondary | ICD-10-CM | POA: Diagnosis not present

## 2017-03-10 DIAGNOSIS — I110 Hypertensive heart disease with heart failure: Secondary | ICD-10-CM | POA: Diagnosis present

## 2017-03-10 DIAGNOSIS — D649 Anemia, unspecified: Secondary | ICD-10-CM | POA: Diagnosis not present

## 2017-03-10 DIAGNOSIS — K921 Melena: Secondary | ICD-10-CM | POA: Diagnosis present

## 2017-03-10 DIAGNOSIS — R197 Diarrhea, unspecified: Secondary | ICD-10-CM | POA: Diagnosis not present

## 2017-03-10 DIAGNOSIS — K2970 Gastritis, unspecified, without bleeding: Secondary | ICD-10-CM | POA: Diagnosis not present

## 2017-03-10 DIAGNOSIS — R5383 Other fatigue: Secondary | ICD-10-CM | POA: Diagnosis not present

## 2017-03-10 DIAGNOSIS — I340 Nonrheumatic mitral (valve) insufficiency: Secondary | ICD-10-CM | POA: Diagnosis not present

## 2017-03-10 DIAGNOSIS — K627 Radiation proctitis: Principal | ICD-10-CM | POA: Diagnosis present

## 2017-03-10 DIAGNOSIS — K222 Esophageal obstruction: Secondary | ICD-10-CM | POA: Diagnosis not present

## 2017-03-10 DIAGNOSIS — D509 Iron deficiency anemia, unspecified: Secondary | ICD-10-CM

## 2017-03-10 DIAGNOSIS — D508 Other iron deficiency anemias: Secondary | ICD-10-CM | POA: Diagnosis not present

## 2017-03-10 DIAGNOSIS — R195 Other fecal abnormalities: Secondary | ICD-10-CM | POA: Diagnosis not present

## 2017-03-10 DIAGNOSIS — B9681 Helicobacter pylori [H. pylori] as the cause of diseases classified elsewhere: Secondary | ICD-10-CM | POA: Diagnosis not present

## 2017-03-10 DIAGNOSIS — Z7982 Long term (current) use of aspirin: Secondary | ICD-10-CM

## 2017-03-10 DIAGNOSIS — R0602 Shortness of breath: Secondary | ICD-10-CM | POA: Diagnosis not present

## 2017-03-10 DIAGNOSIS — Z9049 Acquired absence of other specified parts of digestive tract: Secondary | ICD-10-CM | POA: Diagnosis not present

## 2017-03-10 DIAGNOSIS — K5730 Diverticulosis of large intestine without perforation or abscess without bleeding: Secondary | ICD-10-CM | POA: Diagnosis present

## 2017-03-10 DIAGNOSIS — I351 Nonrheumatic aortic (valve) insufficiency: Secondary | ICD-10-CM | POA: Diagnosis not present

## 2017-03-10 HISTORY — DX: Heart failure, unspecified: I50.9

## 2017-03-10 HISTORY — DX: Gastrointestinal hemorrhage, unspecified: K92.2

## 2017-03-10 LAB — COMPREHENSIVE METABOLIC PANEL
ALBUMIN: 4.7 g/dL (ref 3.5–5.0)
ALK PHOS: 55 U/L (ref 38–126)
ALT: 18 U/L (ref 17–63)
AST: 23 U/L (ref 15–41)
Anion gap: 11 (ref 5–15)
BILIRUBIN TOTAL: 0.8 mg/dL (ref 0.3–1.2)
BUN: 19 mg/dL (ref 6–20)
CALCIUM: 9.1 mg/dL (ref 8.9–10.3)
CO2: 18 mmol/L — ABNORMAL LOW (ref 22–32)
CREATININE: 1.01 mg/dL (ref 0.61–1.24)
Chloride: 101 mmol/L (ref 101–111)
GFR calc Af Amer: >60 mL/min (ref >60)
GLUCOSE: 100 mg/dL — AB (ref 65–99)
Potassium: 4.2 mmol/L (ref 3.5–5.1)
Sodium: 130 mmol/L — ABNORMAL LOW (ref 135–145)
TOTAL PROTEIN: 7.9 g/dL (ref 6.5–8.1)

## 2017-03-10 LAB — CBC WITH DIFFERENTIAL/PLATELET
Basophils Absolute: 0 10*3/uL (ref 0.0–0.1)
Basophils Relative: 1 %
EOS PCT: 2 %
Eosinophils Absolute: 0.1 10*3/uL (ref 0.0–0.7)
HEMATOCRIT: 20 % — AB (ref 39.0–52.0)
HEMOGLOBIN: 5.9 g/dL — AB (ref 13.0–17.0)
LYMPHS PCT: 13 %
Lymphs Abs: 0.5 10*3/uL — ABNORMAL LOW (ref 0.7–4.0)
MCH: 21.3 pg — ABNORMAL LOW (ref 26.0–34.0)
MCHC: 29.5 g/dL — ABNORMAL LOW (ref 30.0–36.0)
MCV: 72.2 fL — AB (ref 78.0–100.0)
MONO ABS: 0.3 10*3/uL (ref 0.1–1.0)
MONOS PCT: 6 %
NEUTROS PCT: 78 %
Neutro Abs: 3.3 10*3/uL (ref 1.7–7.7)
Platelets: 329 10*3/uL (ref 150–400)
RBC: 2.77 MIL/uL — AB (ref 4.22–5.81)
RDW: 18.3 % — AB (ref 11.5–15.5)
WBC: 4.2 10*3/uL (ref 4.0–10.5)

## 2017-03-10 LAB — RETICULOCYTES
RBC.: 2.77 MIL/uL — ABNORMAL LOW (ref 4.22–5.81)
Retic Count, Absolute: 72 10*3/uL (ref 19.0–186.0)
Retic Ct Pct: 2.6 % (ref 0.4–3.1)

## 2017-03-10 LAB — PROTIME-INR
INR: 1.1
PROTHROMBIN TIME: 14.1 s (ref 11.4–15.2)

## 2017-03-10 LAB — TROPONIN I

## 2017-03-10 LAB — BRAIN NATRIURETIC PEPTIDE: B Natriuretic Peptide: 312.3 pg/mL — ABNORMAL HIGH (ref 0.0–100.0)

## 2017-03-10 LAB — POC OCCULT BLOOD, ED: FECAL OCCULT BLD: POSITIVE — AB

## 2017-03-10 MED ORDER — SODIUM CHLORIDE 0.9 % IV SOLN
510.0000 mg | Freq: Once | INTRAVENOUS | Status: AC
  Administered 2017-03-10: 510 mg via INTRAVENOUS
  Filled 2017-03-10: qty 17

## 2017-03-10 MED ORDER — FUROSEMIDE 10 MG/ML IJ SOLN: 20.0000 mg | Freq: Every day | INTRAMUSCULAR | Status: DC

## 2017-03-10 MED ORDER — POTASSIUM CHLORIDE CRYS ER 20 MEQ PO TBCR
20.0000 meq | EXTENDED_RELEASE_TABLET | Freq: Every day | ORAL | Status: DC
  Administered 2017-03-11 – 2017-03-18 (×7): 20 meq via ORAL
  Filled 2017-03-10 (×8): qty 1

## 2017-03-10 MED ORDER — ONDANSETRON HCL 4 MG PO TABS
4.0000 mg | ORAL_TABLET | Freq: Four times a day (QID) | ORAL | Status: DC | PRN
Start: 2017-03-10 — End: 2017-03-18

## 2017-03-10 MED ORDER — PANTOPRAZOLE SODIUM 40 MG IV SOLR: 40.0000 mg | Freq: Two times a day (BID) | INTRAVENOUS | Status: DC

## 2017-03-10 MED ORDER — ONDANSETRON HCL 4 MG/2ML IJ SOLN: 4.0000 mg | Freq: Four times a day (QID) | INTRAMUSCULAR | Status: DC | PRN

## 2017-03-10 MED ORDER — SODIUM CHLORIDE 0.9 % IV SOLN
8.0000 mg/h | INTRAVENOUS | Status: DC
  Administered 2017-03-10 – 2017-03-13 (×5): 8 mg/h via INTRAVENOUS
  Filled 2017-03-10 (×10): qty 80

## 2017-03-10 MED ORDER — SODIUM CHLORIDE 0.9 % IV SOLN
80.0000 mg | Freq: Once | INTRAVENOUS | Status: AC
  Administered 2017-03-10: 80 mg via INTRAVENOUS
  Filled 2017-03-10: qty 80

## 2017-03-10 MED ORDER — LEVOTHYROXINE SODIUM 50 MCG PO TABS
50.0000 ug | ORAL_TABLET | Freq: Every day | ORAL | Status: DC
  Administered 2017-03-11 – 2017-03-18 (×7): 50 ug via ORAL
  Filled 2017-03-10 (×9): qty 1

## 2017-03-10 MED ORDER — LISINOPRIL 20 MG PO TABS
20.0000 mg | ORAL_TABLET | Freq: Every day | ORAL | Status: DC
  Administered 2017-03-11 – 2017-03-15 (×5): 20 mg via ORAL
  Filled 2017-03-10 (×5): qty 1

## 2017-03-10 NOTE — ED Notes (Signed)
Pts wife Leda Gauze phone [phone] Home  Cell [phone]

## 2017-03-10 NOTE — ED Triage Notes (Signed)
Patient been dealing with internal bleeding for couple months but reporting pale, increased weakness and was seen at PCP today where had stat lab work and HGB 5.3 and sent to ED for admission for further GI work up to find source of bleeding

## 2017-03-11 ENCOUNTER — Encounter (HOSPITAL_COMMUNITY): Payer: Self-pay | Admitting: Certified Registered Nurse Anesthetist

## 2017-03-11 ENCOUNTER — Inpatient Hospital Stay (HOSPITAL_COMMUNITY): Payer: Medicare Other

## 2017-03-11 DIAGNOSIS — D500 Iron deficiency anemia secondary to blood loss (chronic): Secondary | ICD-10-CM

## 2017-03-11 DIAGNOSIS — I340 Nonrheumatic mitral (valve) insufficiency: Secondary | ICD-10-CM

## 2017-03-11 DIAGNOSIS — K922 Gastrointestinal hemorrhage, unspecified: Secondary | ICD-10-CM

## 2017-03-11 DIAGNOSIS — I351 Nonrheumatic aortic (valve) insufficiency: Secondary | ICD-10-CM

## 2017-03-11 DIAGNOSIS — Z8546 Personal history of malignant neoplasm of prostate: Secondary | ICD-10-CM

## 2017-03-11 LAB — CBC
HCT: 16.7 % — ABNORMAL LOW (ref 39.0–52.0)
Hemoglobin: 5 g/dL — CL (ref 13.0–17.0)
MCH: 21.2 pg — ABNORMAL LOW (ref 26.0–34.0)
MCHC: 29.9 g/dL — ABNORMAL LOW (ref 30.0–36.0)
MCV: 70.8 fL — ABNORMAL LOW (ref 78.0–100.0)
Platelets: 288 10*3/uL (ref 150–400)
RBC: 2.36 MIL/uL — AB (ref 4.22–5.81)
RDW: 18.1 % — ABNORMAL HIGH (ref 11.5–15.5)
WBC: 5.2 10*3/uL (ref 4.0–10.5)

## 2017-03-11 LAB — ECHOCARDIOGRAM COMPLETE
AV Area VTI index: 0.72 cm2/m2
AV Area VTI: 1.73 cm2
AV Mean grad: 18 mmHg
AV Peak grad: 25 mmHg
AV VEL mean LVOT/AV: 0.34
AV pk vel: 215 cm/s
AVAREAMEANV: 1.25 cm2
AVAREAMEANVIN: 0.61 cm2/m2
CHL CUP AV PEAK INDEX: 0.85
CHL CUP AV VEL: 1.46
CHL CUP DOP CALC LVOT VTI: 25 cm
DOP CAL AO MEAN VELOCITY: 199 cm/s
EERAT: 14.5
FS: 29 % (ref 28–44)
Height: 69 in
IVS/LV PW RATIO, ED: 1.13
LA diam end sys: 44.7 mm
LADIAMINDEX: 2.19 cm/m2
LASIZE: 44.7 mm
LV E/e' medial: 14.5
LV E/e'average: 14.5
LV PW d: 12.3 mm — AB (ref 0.6–1.1)
LV PW s: 0.71 mm
LV TDI E'LATERAL: 11.1
LV TDI E'MEDIAL: 5.76
LV e' LATERAL: 11.1 cm/s
LVOT SV: 93 mL
LVOT area: 3.71 cm2
LVOT diameter: 21.7 mm
LVOTVTI: 0.39 cm
MV pk A vel: 135 m/s
MV pk E vel: 161 m/s
MVPG: 10 mmHg
VTI: 63.9 cm
Valve area index: 0.72
Valve area: 1.46 cm2
WEIGHTICAEL: 3040 [oz_av]

## 2017-03-11 LAB — TYPE AND SCREEN
ABO/RH(D): O POS
ANTIBODY SCREEN: NEGATIVE

## 2017-03-11 LAB — IRON AND TIBC
Iron: 12 ug/dL — ABNORMAL LOW (ref 45–182)
SATURATION RATIOS: 3 % — AB (ref 17.9–39.5)
TIBC: 475 ug/dL — ABNORMAL HIGH (ref 250–450)
UIBC: 463 ug/dL

## 2017-03-11 LAB — BASIC METABOLIC PANEL
ANION GAP: 8 (ref 5–15)
BUN: 17 mg/dL (ref 6–20)
CO2: 21 mmol/L — ABNORMAL LOW (ref 22–32)
Calcium: 8.9 mg/dL (ref 8.9–10.3)
Chloride: 103 mmol/L (ref 101–111)
Creatinine, Ser: 1.11 mg/dL (ref 0.61–1.24)
Glucose, Bld: 89 mg/dL (ref 65–99)
Potassium: 3.8 mmol/L (ref 3.5–5.1)
SODIUM: 132 mmol/L — AB (ref 135–145)

## 2017-03-11 LAB — OSMOLALITY, URINE: Osmolality, Ur: 445 mOsm/kg (ref 300–900)

## 2017-03-11 LAB — SODIUM, URINE, RANDOM: SODIUM UR: 71 mmol/L

## 2017-03-11 LAB — ABO/RH: ABO/RH(D): O POS

## 2017-03-11 LAB — FOLATE: FOLATE: 30 ng/mL (ref >5.9)

## 2017-03-11 LAB — FERRITIN: Ferritin: 4 ng/mL — ABNORMAL LOW (ref 24–336)

## 2017-03-11 LAB — VITAMIN B12: VITAMIN B 12: 460 pg/mL (ref 180–914)

## 2017-03-11 MED ORDER — PANTOPRAZOLE SODIUM 40 MG IV SOLR: 40.0000 mg | Freq: Two times a day (BID) | INTRAVENOUS | Status: DC

## 2017-03-11 NOTE — Consult Note (Addendum)
[facility] Consult  Referring Provider: Edwin Dada, MD Primary Care Physician:  Jani Gravel, MD Primary Gastroenterologist: Larwance Rote  Reason for Consultation:  Black liquid diarrhea, severe anemia  HPI: Scott Newman is a 71 y.o. Caucasian male who presented to the ER on 03/10/17 with complains of weakness. Patient states he was in his usual state of health until 2 months ago, and he developed black diarrhea, which she describes as liquid to watery stools, mostly black, intermittently doubt brown in color. He has also noticed bright red blood in stool, intermittently, small in amount, once or twice per week. This is not associated with abdominal pain, however, patient has lost about 10 pounds the last 2 months. He does not report early satiety, heartburn, acid reflux, difficulty or pain on swallowing. For several years patient has been taking 4 tablets of 325 mg aspirin for joint pain. Since April, his wife was concerned about ulcers, and as such patient has stopped taking 4 pills of aspirin a day. Recently he takes 1 pill of 325 mg aspirin as needed, last was used one week ago. He uses aspirin for chest pressure(patient states"which now he realizes was likely related to anemia").  He does not take any other NSAIDs, and reports allergy to Tylenol. Patient has struggled with"bowel movements all his life". He states that he has frequent accidents, also loose black stools, but feels incomplete evacuation of stool and uses Fleet enema, once a day to once in 2-3 days, to completely evacuate his bowel. He had a branchial cyst removed from his left ear in December 2017, and since then he feels his "bowel movements have all been messed up". He also used to be on Linzess, 145 g every 2-3 days"to evacuate his bowels". Patient's last colonoscopy was October 2016, he had a tubular adenoma removed from sigmoid and had sigmoid diverticulosis, otherwise unremarkable. In 2012, he had  cecal tubular adenomas 3 cm and 3.5 cm in size, for which she underwent a right colectomy. No prior EGD. Patient denies nausea or vomiting.  He was diagnosed with prostate cancer in 2016, after his colonoscopy, he underwent radiation therapy.  For the last several weeks patient has become progressively weak. Yesterday when he had difficulty dressing himself his wife decided to take him to ER. He is short of breath, worse with exertion, and complains of fatigue, and lack of energy.    Past Medical History:  Diagnosis Date  . Aortic stenosis   . Arthritis   . CHF (congestive heart failure) ([place])   . Chronic diastolic heart failure ([place])    echocardiogram 12/11: EF 48-54%, grade 1 diastolic dysfunction, mild aortic stenosis with a mean gradient of 6 mm of mercury, moderate mitral annular calcification, mild LAE, trivial pericardial effusion;    Myoview 12/11 (during admission for heart failure): No ischemia or scar, EF 63%.  . Colon polyp   . Depression   . Difficulty swallowing   . Gait abnormality   . Hernia    umbilical and inguinal  . History of alcohol abuse   . History of cellulitis and abscess   . HTN (hypertension)   . Hypothyroidism   . Prostate cancer ([place])   . Slurred speech   . Thyroid disease   . Wears glasses     Past Surgical History:  Procedure Laterality Date  . APPENDECTOMY    . COLON SURGERY  05/04/11   Lap assisted right colectomy  . EAR CYST EXCISION Left 07/07/2016   Procedure:  BRANCHIAL CLEFT CYST EXCISION;  Surgeon: Izora Gala, MD;  Location: [facility];  Service: ENT;  Laterality: Left;  . HERNIA REPAIR     umbilical and inguinal- rt  . PROSTATE BIOPSY    . ROOT CANAL      Prior to Admission medications   Medication Sig Start Date End Date Taking? Authorizing Provider  aspirin 325 MG tablet Take 325 mg by mouth every 6 (six) hours as needed for moderate pain or headache.    Yes [provider]  Cholecalciferol (VITAMIN D) 2000 units CAPS  Take 2,000 Units by mouth every evening.    Yes [provider]  Coenzyme Q10 (COQ10 PO) Take 60 mg by mouth daily.    Yes [provider]  levothyroxine (SYNTHROID, LEVOTHROID) 50 MCG tablet Take 50 mcg by mouth daily before breakfast.   Yes [provider]  lisinopril (PRINIVIL,ZESTRIL) 20 MG tablet TAKE 1 TABLET BY MOUTH EVERY DAY 06/30/16  Yes Minus Breeding, MD  LORazepam (ATIVAN) 0.5 MG tablet Take 0.125-0.25 mg by mouth at bedtime as needed for anxiety or sleep.  02/14/17  Yes [provider]  Lutein 40 MG CAPS Take 1 capsule by mouth daily.   Yes [provider]  Multiple Vitamin (MULTI-VITAMIN PO) Take 1 tablet by mouth every evening.    Yes [provider]  Tetrahydrozoline HCl (VISINE OP) Apply 1 drop to eye daily as needed (dry eyes).    [provider]    Current Facility-Administered Medications  Medication Dose Route Frequency Provider Last Rate Last Dose  . furosemide (LASIX) injection 20 mg  20 mg Intravenous Daily Danford, Suann Larry, MD      . levothyroxine (SYNTHROID, LEVOTHROID) tablet 50 mcg  50 mcg Oral QAC breakfast Danford, Suann Larry, MD   50 mcg at 03/11/17 0930  . lisinopril (PRINIVIL,ZESTRIL) tablet 20 mg  20 mg Oral Daily Danford, Suann Larry, MD      . ondansetron (ZOFRAN) tablet 4 mg  4 mg Oral Q6H PRN Danford, Suann Larry, MD       Or  . ondansetron (ZOFRAN) injection 4 mg  4 mg Intravenous Q6H PRN Danford, Suann Larry, MD      . pantoprazole (PROTONIX) 80 mg in sodium chloride 0.9 % 250 mL (0.32 mg/mL) infusion  8 mg/hr Intravenous Continuous Tanna Furry, MD   Stopped at 03/11/17 [phone]  . potassium chloride SA (K-DUR,KLOR-CON) CR tablet 20 mEq  20 mEq Oral Daily Danford, Suann Larry, MD       Current Outpatient Prescriptions  Medication Sig Dispense Refill  . aspirin 325 MG tablet Take 325 mg by mouth every 6 (six) hours as needed for moderate pain or headache.     . Cholecalciferol  (VITAMIN D) 2000 units CAPS Take 2,000 Units by mouth every evening.     . Coenzyme Q10 (COQ10 PO) Take 60 mg by mouth daily.     Marland Kitchen levothyroxine (SYNTHROID, LEVOTHROID) 50 MCG tablet Take 50 mcg by mouth daily before breakfast.    . lisinopril (PRINIVIL,ZESTRIL) 20 MG tablet TAKE 1 TABLET BY MOUTH EVERY DAY 90 tablet 3  . LORazepam (ATIVAN) 0.5 MG tablet Take 0.125-0.25 mg by mouth at bedtime as needed for anxiety or sleep.     . Lutein 40 MG CAPS Take 1 capsule by mouth daily.    . Multiple Vitamin (MULTI-VITAMIN PO) Take 1 tablet by mouth every evening.     . Tetrahydrozoline HCl (VISINE OP) Apply 1 drop to eye daily  as needed (dry eyes).      Allergies as of 03/10/2017 - Review Complete 03/10/2017  Allergen Reaction Noted  . Acetaminophen Nausea And Vomiting 04/04/2015    Family History  Problem Relation Age of Onset  . Heart disease Father   . Cancer Paternal Aunt        breast  . Cancer Paternal Uncle        prostate    Social History   Social History  . Marital status: Married    Spouse name: Leda Gauze  . Number of children: 0  . Years of education: 12 years   Occupational History  . Retired    Social History Main Topics  . Smoking status: Former Smoker    Quit date: 03/17/1965  . Smokeless tobacco: Never Used  . Alcohol use 0.0 oz/week     Comment: occasional  . Drug use: No  . Sexual activity: No   Other Topics Concern  . Not on file   Social History Narrative   Retired   Right handed   Lives with wife and mother-in-law   High school education   1-2 cups coffee/day    Review of Systems: Positive for: GI: Described in detail in HPI.    Gen: fatigue, weakness, malaise, involuntary weight loss,Denies any fever, chills, rigors, night sweats, anorexia and sleep disorder CV: chest pain, angina,Denies  palpitations, syncope, orthopnea, PND, peripheral edema, and claudication. Resp: Denies dyspnea, cough, sputum, wheezing, coughing up blood. GU : Denies  urinary burning, blood in urine, urinary frequency, urinary hesitancy, nocturnal urination, and urinary incontinence. MS:  joint pain Denies joint swelling.  Denies muscle weakness, cramps, atrophy.  Derm: Denies rash, itching, oral ulcerations, hives, unhealing ulcers.  Psych: Denies depression, anxiety, memory loss, suicidal ideation, hallucinations,  and confusion. Heme: Denies bruising, bleeding, and enlarged lymph nodes. Neuro:  Denies any headaches, dizziness, paresthesias. Endo:   Thyroid, Denies any problems with DM or adrenal function.  Physical Exam: Vital signs in last 24 hours: Temp:  [97.7 F (36.5 C)-98 F (36.7 C)] 98 F (36.7 C) (08/31 0455) Pulse Rate:  [70-126] 81 (08/31 0831) Resp:  [12-24] 15 (08/31 0831) BP: (100-160)/(47-99) 136/62 (08/31 0831) SpO2:  [91 %-100 %] 95 % (08/31 0831) Weight:  [86.2 kg (190 lb)] 86.2 kg (190 lb) (08/30 1731)    General:   Alert,  Well-developed, well-nourished, pleasant and cooperative in NAD Head:  Normocephalic and atraumatic. Eyes:  Sclera clear, no icterus.   Obvious pallor Ears:  Normal auditory acuity. Nose:  No deformity, discharge,  or lesions. Mouth:  No deformity or lesions.  Oropharynx pink & moist. Neck:  Supple; no masses or thyromegaly. Lungs:  Clear throughout to auscultation.   No wheezes, crackles, or rhonchi. No acute distress. Heart:  Regular rate and rhythm; no murmurs, clicks, rubs,  or gallops. Extremities:  Without clubbing or edema. Neurologic:  Alert and  oriented x4;  grossly normal neurologically. Skin:  Intact without significant lesions or rashes. Psych:  Alert and cooperative. Normal mood and affect. Abdomen:  Soft, nontender and nondistended. No masses, hepatosplenomegaly or hernias noted. Normal bowel sounds, without guarding, and without rebound.         Lab Results:  Recent Labs  03/10/17 1846 03/11/17 0705  WBC 4.2 5.2  HGB 5.9* 5.0*  HCT 20.0* 16.7*  PLT 329 288   BMET  Recent  Labs  03/10/17 1846 03/11/17 0705  NA 130* 132*  K 4.2 3.8  CL 101 103  CO2 18* 21*  GLUCOSE 100* 89  BUN 19 17  CREATININE 1.01 1.11  CALCIUM 9.1 8.9   LFT  Recent Labs  03/10/17 1846  PROT 7.9  ALBUMIN 4.7  AST 23  ALT 18  ALKPHOS 55  BILITOT 0.8   PT/INR  Recent Labs  03/10/17 1846  LABPROT 14.1  INR 1.10    Studies/Results: Dg Chest Port 1 View  Result Date: 03/10/2017 CLINICAL DATA:  Shortness of breath, CHF, anemia, hypertension EXAM: PORTABLE CHEST 1 VIEW COMPARISON:  Portable exam 1850 hours compared to 09/23/2010 FINDINGS: Normal heart size and mediastinal contours. Atherosclerotic calcification aorta. Hazy interstitial opacities in the mid to lower lungs bilaterally which could represent atelectasis or infiltrate. Upper lungs clear. No pleural effusion or pneumothorax. No acute osseous findings. IMPRESSION: Hazy bibasilar opacities which could represent atelectasis or early infiltrate. Electronically Signed   By: Lavonia Dana M.D.   On: 03/10/2017 21:15    Impression: 1. Severe microcytic anemia(hemoglobin 5.9 on admission, 5 today, 12 in 06/2016) 2. Severe iron deficiency, saturation 3%, elevated TIBC of 475, ferritin only 4 3. Liquid black stools, likely melena 4. Intermittent bright red blood per rectum? Hemorrhoids? Radiation proctitis  Plan: 1. Patient received very heme IV in ER 2. Continue Protonix strip at 8 mg per hour IV  I had a lengthy discussion with the patient and his wife at bedside regarding the need for a diagnostic and possible therapeutic EGD. Because of use of aspirin(325 mg 4 pills a day for several years), this could be peptic ulcer disease. Patient remains hemodynamically stable, however hemoglobin is significantly low at 5. 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  Diagnostic procedures such as endoscopy with anesthesia would be high risk. Patient and his wife understand that therapeutic procedure with use of  endoclips or coagulation may increase risk of further bleeding and worsen anemia.  Recommend hematology consultation to help in this situation. Meanwhile, I will start the patient on clear liquid diet and keep him nothing by mouth post midnight for possible endoscopy in a.m.  At one point patient may benefit from a flexible sigmoidoscopy for intermittent bright red blood per rectum(? Radiation proctitis requiring RFA versus APC) or diagnosis of hemorrhoids.    LOS: 1 day   Ronnette Juniper, M.D.  03/11/2017, 9:32 AM  Pager [phone] If no answer or after 5 PM call [phone]

## 2017-03-11 NOTE — ED Notes (Addendum)
Spoke with Gerald Stabs, [employer], he reports urine and a GOLD TOP is needed for patients lab. Urine will be sent down momentarily. Gerald Stabs reports GOLD TOP can be drawn with 5:00AM blood drawal due to this is a send out at 7:00am.

## 2017-03-11 NOTE — Progress Notes (Signed)
I have discussed case with Dr. Therisa Doyne; patient has had intermittent dark stools, black diarrhea for two months.  Rectal exam this admission by report showed brown mucous.  Patient has Hgb 5.  I do not feel comfortable doing an extremely high risk endoscopy on a patient who can not accept blood products unless there is evidence of overt exsanguinating bleeding.  I would endorse conservative therapy, PPI therapy, and see what hematologists can do to help increase Hgb.  I feel this is a slowly progressive issue over the past several weeks, not an acute bleed and, as such, I think the best course of action right now is to try to increase patient's Hgb conservatively prior to consideration of an endoscopy which as I mentioned would be extremely high risk.  As such, will cancel endoscopy for tomorrow.  I would also ask, at wife's request, hematology's input whether or not there are "bloodless" tertiary care centers regionally and whether transfer there would be of any benefit.  I will see patient on rounds tomorrow.

## 2017-03-11 NOTE — ED Notes (Signed)
Bed: WA09 Expected date:  Expected time:  Means of arrival:  Comments: Room 4

## 2017-03-11 NOTE — Progress Notes (Signed)
  Echocardiogram 2D Echocardiogram has been performed.  Darlina Sicilian M 03/11/2017, 1:00 PM

## 2017-03-11 NOTE — ED Notes (Signed)
ECHO being performed.  

## 2017-03-11 NOTE — Progress Notes (Signed)
Please call Sophia with report at [phone] at [phone]. Andre Lefort

## 2017-03-11 NOTE — ED Notes (Signed)
Pt is  Alert and oriented x 4 and is verbally responsive. Pt states that he has a aches in his abdomen x 4 quads, states that it is not a pain and has generalized weakness.

## 2017-03-11 NOTE — ED Notes (Signed)
Hospital Provider at bedside 

## 2017-03-11 NOTE — ED Notes (Signed)
ECHO completed

## 2017-03-11 NOTE — ED Notes (Signed)
Gave report to [place], Therapist, sports.

## 2017-03-11 NOTE — Consult Note (Signed)
[place]  [phone]   HEMATOLOGY ONCOLOGY INPATIENT CONSULTATION   Scott Newman  DOB: 1945/10/06  MR#: [id number]  CSN#: [id number]    Requesting Physician: [facility]  Patient Care Team: Jani Gravel, MD as PCP - General (Internal Medicine)  Reason for consult: GI bleeding and iron deficient anemia   History of present illness:    Scott Newman presented to [facility] ED from Dr. Julianne Rice office on 03/10/2017 with progressive weakness. At the end of April, he noticed bright red blood in his stool that was occasional at first. He thought this was due to a hemorrhoid or because he was passing "stems" from leafy greens he had been eating. This progressed and his stool became darker and liquid over the last month. He is occasionally incontinent of dark bloody stool, especially while he is sleeping. He has decreased appetite and reports 20 pound weight loss since April.   The patient has been taking 4 tablets of aspirin 325 mg per day for "most of my life" but recently decreased to 1 tablet as needed; last used 1 week ago with an episode of chest discomfort. Over the last few weeks, he has become progressively weak, having difficulty performing ADL's, while also having dyspnea, fatigue, and general malaise.   He denies other NSAID use and does not use tylenol. He denies abdominal pain, nausea, vomiting, early satiety, reflux, or difficulty or pain on swallowing.   The patient reports a history of difficult bowel movements most of his life, requiring frequent Fleet enema every few days in order to completely empty bowels. He stopped using enemas when he noticed blood in stool. He has also required Linzess for bowel movements in the past. Bowel pattern has been abnormal since surgery on 06/2016 to remove branchial cyst from left ear. Patient's last colonoscopy was October 2016, he had a tubular adenoma removed from sigmoid and had sigmoid diverticulosis,  otherwise unremarkable. In 2012, he had cecal tubular adenomas 3 cm and 3.5 cm in size, for which she underwent a right colectomy. No prior EGD.  He received radiation for prostate cancer with Dr. Tammi Klippel from 05/27/2015-07/24/2015.   MEDICAL HISTORY:  Past Medical History:  Diagnosis Date  . Aortic stenosis   . Arthritis   . CHF (congestive heart failure) ([place])   . Chronic diastolic heart failure ([place])    echocardiogram 12/11: EF 10-93%, grade 1 diastolic dysfunction, mild aortic stenosis with a mean gradient of 6 mm of mercury, moderate mitral annular calcification, mild LAE, trivial pericardial effusion;    Myoview 12/11 (during admission for heart failure): No ischemia or scar, EF 63%.  . Colon polyp   . Depression   . Difficulty swallowing   . Gait abnormality   . Hernia    umbilical and inguinal  . History of alcohol abuse   . History of cellulitis and abscess   . HTN (hypertension)   . Hypothyroidism   . Prostate cancer ([place])   . Slurred speech   . Thyroid disease   . Wears glasses    SURGICAL HISTORY: Past Surgical History:  Procedure Laterality Date  . APPENDECTOMY    . COLON SURGERY  05/04/11   Lap assisted right colectomy  . EAR CYST EXCISION Left 07/07/2016   Procedure: BRANCHIAL CLEFT CYST EXCISION;  Surgeon: Izora Gala, MD;  Location: [facility];  Service: ENT;  Laterality: Left;  . HERNIA REPAIR     umbilical and inguinal- rt  . PROSTATE BIOPSY    .  ROOT CANAL     SOCIAL HISTORY: Social History   Social History  . Marital status: Married    Spouse name: Leda Gauze  . Number of children: 0  . Years of education: 12 years   Occupational History  . Retired    Social History Main Topics  . Smoking status: Former Smoker    Quit date: 03/17/1965  . Smokeless tobacco: Never Used  . Alcohol use 0.0 oz/week     Comment: occasional  . Drug use: No  . Sexual activity: No   Other Topics Concern  . Not on file   Social History Narrative   Retired   Right  handed   Lives with wife and mother-in-law   High school education   1-2 cups coffee/day   FAMILY HISTORY: Family History  Problem Relation Age of Onset  . Heart disease Father   . Cancer Paternal Aunt        breast  . Cancer Paternal Uncle        prostate   ALLERGIES:  is allergic to acetaminophen.  MEDICATIONS:  Current Facility-Administered Medications  Medication Dose Route Frequency Provider Last Rate Last Dose  . levothyroxine (SYNTHROID, LEVOTHROID) tablet 50 mcg  50 mcg Oral QAC breakfast Edwin Dada, MD   50 mcg at 03/11/17 0930  . lisinopril (PRINIVIL,ZESTRIL) tablet 20 mg  20 mg Oral Daily Danford, Suann Larry, MD      . ondansetron (ZOFRAN) tablet 4 mg  4 mg Oral Q6H PRN Danford, Suann Larry, MD       Or  . ondansetron (ZOFRAN) injection 4 mg  4 mg Intravenous Q6H PRN Danford, Suann Larry, MD      . pantoprazole (PROTONIX) 80 mg in sodium chloride 0.9 % 250 mL (0.32 mg/mL) infusion  8 mg/hr Intravenous Continuous Tanna Furry, MD   Stopped at 03/11/17 [phone]  . potassium chloride SA (K-DUR,KLOR-CON) CR tablet 20 mEq  20 mEq Oral Daily Danford, Suann Larry, MD       Current Outpatient Prescriptions  Medication Sig Dispense Refill  . aspirin 325 MG tablet Take 325 mg by mouth every 6 (six) hours as needed for moderate pain or headache.     . Cholecalciferol (VITAMIN D) 2000 units CAPS Take 2,000 Units by mouth every evening.     . Coenzyme Q10 (COQ10 PO) Take 60 mg by mouth daily.     Marland Kitchen levothyroxine (SYNTHROID, LEVOTHROID) 50 MCG tablet Take 50 mcg by mouth daily before breakfast.    . lisinopril (PRINIVIL,ZESTRIL) 20 MG tablet TAKE 1 TABLET BY MOUTH EVERY DAY 90 tablet 3  . LORazepam (ATIVAN) 0.5 MG tablet Take 0.125-0.25 mg by mouth at bedtime as needed for anxiety or sleep.     . Lutein 40 MG CAPS Take 1 capsule by mouth daily.    . Multiple Vitamin (MULTI-VITAMIN PO) Take 1 tablet by mouth every evening.     . Tetrahydrozoline HCl (VISINE OP) Apply  1 drop to eye daily as needed (dry eyes).     REVIEW OF SYSTEMS:   Constitutional: Denies fevers, chills (+) sweats after large BM episode Eyes: Denies blurriness of vision, double vision or watery eyes Ears, nose, mouth, throat, and face: Denies mucositis or sore throat Respiratory: Denies cough or wheezes (+) dyspnea on exertion Cardiovascular: Denies palpitation or lower extremity swelling (+) recent chest discomfort, resolved with aspirin Gastrointestinal:  Denies nausea, vomiting, constipation, heartburn or change in bowel habits (+) dark liquid stool with blood  Skin: Denies  abnormal skin rashes Lymphatics: Denies new lymphadenopathy or easy bruising Neurological:Denies numbness, tingling  (+) weakness Behavioral/Psych: Alert, communicative.  All other systems were reviewed with the patient and are negative.  PHYSICAL EXAMINATION: ECOG PERFORMANCE STATUS:   Vitals:   03/11/17 1100 03/11/17 1115  BP: (!) 148/58 (!) 155/57  Pulse:    Resp: 16 16  Temp:    SpO2: 94% 95%   Filed Weights   03/10/17 1731  Weight: 190 lb (86.2 kg)   GENERAL:alert, no distress and comfortable SKIN: (+) pale. skin texture, turgor are normal, no rashes or significant lesions EYES: normal, conjunctiva are pink and non-injected, sclera clear OROPHARYNX:no exudate, no erythema, buccal mucosa, and tongue normal. (+) Lips are pale and dry.   NECK: supple, thyroid normal size, non-tender, without nodularity LYMPH:  no palpable cervical or supraclavicular lymphadenopathy  LUNGS: clear to auscultation bilaterally, normal breathing effort HEART: regular rate & rhythm and no murmurs and no lower extremity edema ABDOMEN:abdomen soft, non-tender and normal bowel sounds Musculoskeletal:no cyanosis of digits and no clubbing  PSYCH: alert & oriented x 3 with fluent speech NEURO: generally weak but exam limited. Patient is in bed.   LABORATORY DATA:  I have reviewed the data as listed Lab Results  Component  Value Date   WBC 5.2 03/11/2017   HGB 5.0 (LL) 03/11/2017   HCT 16.7 (L) 03/11/2017   MCV 70.8 (L) 03/11/2017   PLT 288 03/11/2017    Recent Labs  06/29/16 1628 03/10/17 1846 03/11/17 0705  NA 131* 130* 132*  K 4.8 4.2 3.8  CL 97* 101 103  CO2 26 18* 21*  GLUCOSE 99 100* 89  BUN 15 19 17   CREATININE 1.06 1.01 1.11  CALCIUM 10.1 9.1 8.9  GFRNONAA >60 >60 >60  GFRAA >60 >60 >60  PROT  --  7.9  --   ALBUMIN  --  4.7  --   AST  --  23  --   ALT  --  18  --   ALKPHOS  --  55  --   BILITOT  --  0.8  --     RADIOGRAPHIC STUDIES: I have personally reviewed the radiological images as listed and agreed with the findings in the report. Dg Chest Port 1 View  Result Date: 03/10/2017 CLINICAL DATA:  Shortness of breath, CHF, anemia, hypertension EXAM: PORTABLE CHEST 1 VIEW COMPARISON:  Portable exam 1850 hours compared to 09/23/2010 FINDINGS: Normal heart size and mediastinal contours. Atherosclerotic calcification aorta. Hazy interstitial opacities in the mid to lower lungs bilaterally which could represent atelectasis or infiltrate. Upper lungs clear. No pleural effusion or pneumothorax. No acute osseous findings. IMPRESSION: Hazy bibasilar opacities which could represent atelectasis or early infiltrate. Electronically Signed   By: Lavonia Dana M.D.   On: 03/10/2017 21:15    ASSESSMENT & PLAN: 71 yo male with PMH Hypertension, hypothyroidism, prostate cancer and chronic diastolic CHF, presented to emergency room with worsening shortness of breath and weakness. He reports intermittent melena since April 2018. He was on high dose of aspirin for arthritis, which she stopped this GI bleeding. 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  1. Iron deficient anemia, likely secondary to GI bleeding  2. Acute GI bleeding with melena, acute blood loss anemia, possible related to NSAIDs 3. Chronic diastolic CHF 4. HTN   Recommendations: -Lab reviewed, he has extremely low  ferritin, low iron and transferrin saturation, increased TIBC, this is consistent with iron deficient anemia, likely secondary  to GI bleeding -He is very symptomatic from his severe anemia, unfortunately he is a Jehovah witness, now willing to receive blood transfusion. -he has received one dose of feraheme on 03/10/17, will give 1-2 more dose every week -I also recommend aranesp 68mcg injection once. I ordered for tomorrow morning. We discussed this is off-label use, due to his severe symptomatic anemia, his need to rapidly improve his anemia so he can tolerate endoscopy, I think the benefit is out-weight the risks (cardiovascular risk, thrombosis, tumor growths, etc.) -When his anemia improves, I strongly encouraged him to follow-up with GI doctor for endoscopy to rule out  GI malignancy -I do not think he needs other anemia workup at this point, but certainly will consider if his anemia does not resolve with iron supplement  -I will follow up.   All questions were answered. The patient knows to call the clinic with any problems, questions or concerns.      Truitt Merle, MD 03/11/2017 9:00pm

## 2017-03-11 NOTE — Progress Notes (Signed)
Patient ID: Scott Newman, male   DOB: [birth year], 71 y.o.   MRN: [id number]  PROGRESS NOTE    Scott Newman  ION:[id number] DOB: March 07, 1946 DOA: 03/10/2017  PCP: Jani Gravel, MD   Brief Narrative:   71 year old male with past medical history significant for hypertension, hypothyroidism, prostate cancer and chronic diastolic CHF who presented to ED with worsening shortness of breath and weakness as well as melena for 3 weeks. Pt usually take aspirin but stopped when melena started. Patient was hemodynamic stable on the admission. Hemoglobin was 5.9. Fecal occult blood test was positive. 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 Feraheme and Protonix infusion started. GI will see in consultation.  Assessment & Plan:   Principal Problem: Acute GI bleed with melena / acute blood loss anemia - Because patient is Jehovah's Witness he is not accepting blood transfusion - Started ferriheme - Continue Protonix infusion - Keep nothing by mouth - Continue IV fluids for hydration - GI will see in consultation  Active Problems: Acute on chronic diastolic CHF - Started lasix 20 mg IV - ECHO pending - Continue daily weight and strict intake and output  Essential hypertension - Continue Lisinopril  Hypothyroidism - Continue synthroid   DVT prophylaxis: SCD's Code Status: full code  Family Communication: no family at the bedside Disposition Plan: home once cleared by GI   Consultants:  GI  Procedures:   None   Antimicrobials:   None   Subjective: No overnight events.  Objective: Vitals:   03/11/17 0745 03/11/17 0800 03/11/17 0815 03/11/17 0831  BP: (!) 145/56 (!) 159/63 122/82 136/62  Pulse:    81  Resp: (!) 24 17 17 15   Temp:      TempSrc:      SpO2: 98% 95% 96% 95%  Weight:      Height:        Intake/Output Summary (Last 24 hours) at 03/11/17 0913 Last data filed at 03/11/17 0844  Gross per 24 hour  Intake              467 ml   Output                0 ml  Net              467 ml   Filed Weights   03/10/17 1731  Weight: 86.2 kg (190 lb)    Examination:  General exam: Appears calm and comfortable  Respiratory system: Clear to auscultation. Respiratory effort normal. Cardiovascular system: S1 & S2 heard, RRR. No JVD Gastrointestinal system: Abdomen is nondistended, soft and nontender. No organomegaly or masses felt. Normal bowel sounds heard. Central nervous system: Alert and oriented. No focal neurological deficits. Extremities: Symmetric 5 x 5 power.trace LE edema Skin: No rashes, lesions or ulcers Psychiatry: Judgement and insight appear normal. Mood & affect appropriate.   Data Reviewed: I have personally reviewed following labs and imaging studies  CBC:  Recent Labs Lab 03/10/17 1846 03/11/17 0705  WBC 4.2 5.2  NEUTROABS 3.3  --   HGB 5.9* 5.0*  HCT 20.0* 16.7*  MCV 72.2* 70.8*  PLT 329 244   Basic Metabolic Panel:  Recent Labs Lab 03/10/17 1846 03/11/17 0705  NA 130* 132*  K 4.2 3.8  CL 101 103  CO2 18* 21*  GLUCOSE 100* 89  BUN 19 17  CREATININE 1.01 1.11  CALCIUM 9.1 8.9   GFR: Estimated Creatinine Clearance: 65.4 mL/min (by C-G formula based on  SCr of 1.11 mg/dL). Liver Function Tests:  Recent Labs Lab 03/10/17 1846  AST 23  ALT 18  ALKPHOS 55  BILITOT 0.8  PROT 7.9  ALBUMIN 4.7   No results for input(s): LIPASE, AMYLASE in the last 168 hours. No results for input(s): AMMONIA in the last 168 hours. Coagulation Profile:  Recent Labs Lab 03/10/17 1846  INR 1.10   Cardiac Enzymes:  Recent Labs Lab 03/10/17 1846  TROPONINI <0.03   BNP (last 3 results) No results for input(s): PROBNP in the last 8760 hours. HbA1C: No results for input(s): HGBA1C in the last 72 hours. CBG: No results for input(s): GLUCAP in the last 168 hours. Lipid Profile: No results for input(s): CHOL, HDL, LDLCALC, TRIG, CHOLHDL, LDLDIRECT in the last 72 hours. Thyroid Function  Tests: No results for input(s): TSH, T4TOTAL, FREET4, T3FREE, THYROIDAB in the last 72 hours. Anemia Panel:  Recent Labs  03/10/17 1846  VITAMINB12 460  FOLATE 30.0  FERRITIN 4*  TIBC 475*  IRON 12*  RETICCTPCT 2.6   Urine analysis:    Component Value Date/Time   COLORURINE YELLOW 04/30/2011 1307   APPEARANCEUR CLEAR 04/30/2011 1307   LABSPEC 1.015 04/30/2011 1307   PHURINE 6.5 04/30/2011 1307   GLUCOSEU NEGATIVE 04/30/2011 1307   HGBUR NEGATIVE 04/30/2011 [place] 04/30/2011 [place] 04/30/2011 1307   PROTEINUR NEGATIVE 04/30/2011 1307   UROBILINOGEN 1.0 04/30/2011 1307   NITRITE NEGATIVE 04/30/2011 1307   LEUKOCYTESUR NEGATIVE 04/30/2011 1307   Sepsis Labs: @LABRCNTIP (procalcitonin:4,lacticidven:4)   )No results found for this or any previous visit (from the past 240 hour(s)).    Radiology Studies: Dg Chest Port 1 View  Result Date: 03/10/2017 CLINICAL DATA:  Shortness of breath, CHF, anemia, hypertension EXAM: PORTABLE CHEST 1 VIEW COMPARISON:  Portable exam 1850 hours compared to 09/23/2010 FINDINGS: Normal heart size and mediastinal contours. Atherosclerotic calcification aorta. Hazy interstitial opacities in the mid to lower lungs bilaterally which could represent atelectasis or infiltrate. Upper lungs clear. No pleural effusion or pneumothorax. No acute osseous findings. IMPRESSION: Hazy bibasilar opacities which could represent atelectasis or early infiltrate. Electronically Signed   By: Lavonia Dana M.D.   On: 03/10/2017 21:15       Scheduled Meds: . furosemide  20 mg Intravenous Daily  . levothyroxine  50 mcg Oral QAC breakfast  . lisinopril  20 mg Oral Daily  . potassium chloride  20 mEq Oral Daily   Continuous Infusions: . pantoprozole (PROTONIX) infusion Stopped (03/11/17 0844)     LOS: 1 day    Time spent: 25 minutes  Greater than 50% of the time spent on counseling and coordinating the care.   Leisa Lenz, MD [facility] Pager [phone]  If 7PM-7AM, please contact night-coverage [email or web address] Password [facility] 03/11/2017, 9:13 AM

## 2017-03-11 NOTE — H&P (Signed)
History and Physical  Patient Name: Scott Newman     FYB:[id number]    DOB: Nov 07, 1945    DOA: 03/10/2017 PCP: Jani Gravel, MD  Patient coming from: Home  Chief Complaint: Melena      HPI: Scott Newman is a 71 y.o. male with a past medical history significant for HTN, hypothyroidism, prostate CA and HFpEF who presents with melena for 3 weeks and now dyspnea and weakness.  The patient was in his usual state of health until about a month or so ago, when he started to notice "black diarrhea".  He is actually somewhat vague about how all this started, perhaps initially with bright red scarlet blood in the toilet bowl?  Eventually, however, this resolved into loose coffee-ground like stools, off and on for about three weeks.  Then in the last two weeks, his wife has started to notice he appeared very weak and tired and pale and out of breath with slight exertion.  She begged him to go to the doctor, but he wouldn't until a previously scheduled appointment with Dr. Maudie Mercury today.  He normally takes 4 full strength aspirin 325 mg daily for joint pain, has for years, stopped when the melena started.  ED course: -Afebrile, heart rate 77, respirations and pulse ox normal, BP 142/56 -Na 130, K 4.2, Cr 1.01 (baseline 1.0), WBC 4.2K, Hgb 5.9 (was 12 eight months ago) -The patient is a Sales promotion account executive witness and confirmed to me he would sooner die than receive a blood transfusion -INR normal -BNP 312 and CXR showed trace edema -FOBT+, EDP thought there was bright red blood on the glove -Troponin normal -The case was discussed with GI who agreed with PPI gtt, Feraheme and endoscopy    He has never had a GI bleed. He is not on a bolood thinner, no aspirin in several weeks. His last colonoscopy was with [name] in 2016 before his prostate cancer radiation, and this was unremarkable per wife.  His only report in our system is from 2012 and that showed diverticulosis and several large >1cm adenomas (one of  which actually required partial colectomy).        ROS: Review of Systems  Respiratory: Positive for shortness of breath.   Gastrointestinal: Positive for blood in stool and melena. Negative for abdominal pain, nausea and vomiting.  Neurological: Positive for weakness.  All other systems reviewed and are negative.         Past Medical History:  Diagnosis Date  . Aortic stenosis   . Arthritis   . CHF (congestive heart failure) ([place])   . Chronic diastolic heart failure ([place])    echocardiogram 12/11: EF 52-77%, grade 1 diastolic dysfunction, mild aortic stenosis with a mean gradient of 6 mm of mercury, moderate mitral annular calcification, mild LAE, trivial pericardial effusion;    Myoview 12/11 (during admission for heart failure): No ischemia or scar, EF 63%.  . Colon polyp   . Depression   . Difficulty swallowing   . Gait abnormality   . Hernia    umbilical and inguinal  . History of alcohol abuse   . History of cellulitis and abscess   . HTN (hypertension)   . Hypothyroidism   . Prostate cancer ([place])   . Slurred speech   . Thyroid disease   . Wears glasses     Past Surgical History:  Procedure Laterality Date  . APPENDECTOMY    . COLON SURGERY  05/04/11   Lap assisted right colectomy  .  EAR CYST EXCISION Left 07/07/2016   Procedure: BRANCHIAL CLEFT CYST EXCISION;  Surgeon: Izora Gala, MD;  Location: [facility];  Service: ENT;  Laterality: Left;  . HERNIA REPAIR     umbilical and inguinal- rt  . PROSTATE BIOPSY    . ROOT CANAL      Social History: Patient lives with his wife.  The patient walks unassisted.  Nonsmoker.  He is a retired Recruitment consultant.    Allergies  Allergen Reactions  . Acetaminophen Nausea And Vomiting    Family history: family history includes Cancer in his paternal aunt and paternal uncle; Heart disease in his father.  Prior to Admission medications   Medication Sig Start Date End Date Taking? Authorizing Provider  aspirin 325 MG  tablet Take 325 mg by mouth every 6 (six) hours as needed for moderate pain or headache.    Yes [provider]  Cholecalciferol (VITAMIN D) 2000 units CAPS Take 2,000 Units by mouth every evening.    Yes [provider]  Coenzyme Q10 (COQ10 PO) Take 60 mg by mouth daily.    Yes [provider]  levothyroxine (SYNTHROID, LEVOTHROID) 50 MCG tablet Take 50 mcg by mouth daily before breakfast.   Yes [provider]  lisinopril (PRINIVIL,ZESTRIL) 20 MG tablet TAKE 1 TABLET BY MOUTH EVERY DAY 06/30/16  Yes Minus Breeding, MD  LORazepam (ATIVAN) 0.5 MG tablet Take 0.125-0.25 mg by mouth at bedtime as needed for anxiety or sleep.  02/14/17  Yes [provider]  Lutein 40 MG CAPS Take 1 capsule by mouth daily.   Yes [provider]  Multiple Vitamin (MULTI-VITAMIN PO) Take 1 tablet by mouth every evening.    Yes [provider]  Tetrahydrozoline HCl (VISINE OP) Apply 1 drop to eye daily as needed (dry eyes).    [provider]       Physical Exam: BP 122/68   Pulse 77   Temp 97.7 F (36.5 C) (Oral)   Resp 13   Ht 5\' 9"  (1.753 m)   Wt 86.2 kg (190 lb)   SpO2 91%   BMI 28.06 kg/m  General appearance: Well-developed, adult male, alert and in no acute distress, appears disoriented.   Eyes: Anicteric, conjunctiva pale, lids and lashes normal. PERRL.    ENT: No nasal deformity, discharge, epistaxis.  Hearing normal. OP moist without lesions.   Neck: No neck masses.  Trachea midline.  No thyromegaly/tenderness. Lymph: No cervical or supraclavicular lymphadenopathy. Skin: Warm and dry.  No jaundice.  PALLOR. No suspicious rashes or lesions. Cardiac: RRR, nl S1-S2, flow murmur noted.  Capillary refill is brisk.  JVP not visible.  No LE edema.  Radial and DP pulses 2+ and symmetric. Respiratory: Normal respiratory rate and rhythm.  CTAB without rales or wheezes. Abdomen: Abdomen soft.  No TTP. No ascites, distension,  hepatosplenomegaly.   MSK: No deformities or effusions.  No cyanosis or clubbing. Neuro: Cranial nerves normal.  Sensation intact to light touch. Speech is fluent.  Muscle strength globally weak.    Psych: Sensorium intact and responding to questions, but somewhat disoriented (oriented to place and situation, but appears scattered and tangential).  Affect odd.  Judgment and insight appear impaired.     Labs on Admission:  I have personally reviewed following labs and imaging studies: CBC:  Recent Labs Lab 03/10/17 1846  WBC 4.2  NEUTROABS 3.3  HGB 5.9*  HCT 20.0*  MCV 72.2*  PLT 573   Basic Metabolic Panel:  Recent  Labs Lab 03/10/17 1846  NA 130*  K 4.2  CL 101  CO2 18*  GLUCOSE 100*  BUN 19  CREATININE 1.01  CALCIUM 9.1   GFR: Estimated Creatinine Clearance: 71.9 mL/min (by C-G formula based on SCr of 1.01 mg/dL).  Liver Function Tests:  Recent Labs Lab 03/10/17 1846  AST 23  ALT 18  ALKPHOS 55  BILITOT 0.8  PROT 7.9  ALBUMIN 4.7   No results for input(s): LIPASE, AMYLASE in the last 168 hours. No results for input(s): AMMONIA in the last 168 hours. Coagulation Profile:  Recent Labs Lab 03/10/17 1846  INR 1.10   Cardiac Enzymes:  Recent Labs Lab 03/10/17 1846  TROPONINI <0.03   BNP (last 3 results) No results for input(s): PROBNP in the last 8760 hours. HbA1C: No results for input(s): HGBA1C in the last 72 hours. CBG: No results for input(s): GLUCAP in the last 168 hours. Lipid Profile: No results for input(s): CHOL, HDL, LDLCALC, TRIG, CHOLHDL, LDLDIRECT in the last 72 hours. Thyroid Function Tests: No results for input(s): TSH, T4TOTAL, FREET4, T3FREE, THYROIDAB in the last 72 hours. Anemia Panel:  Recent Labs  03/10/17 1846  VITAMINB12 460  FOLATE 30.0  FERRITIN 4*  TIBC 475*  IRON 12*  RETICCTPCT 2.6   Sepsis Labs:  Invalid input(s): PROCALCITONIN, LACTICIDVEN No results found for this or any previous visit (from the past  240 hour(s)).       Radiological Exams on Admission: Personally reviewed CXR shows trace edema: Dg Chest Port 1 View  Result Date: 03/10/2017 CLINICAL DATA:  Shortness of breath, CHF, anemia, hypertension EXAM: PORTABLE CHEST 1 VIEW COMPARISON:  Portable exam 1850 hours compared to 09/23/2010 FINDINGS: Normal heart size and mediastinal contours. Atherosclerotic calcification aorta. Hazy interstitial opacities in the mid to lower lungs bilaterally which could represent atelectasis or infiltrate. Upper lungs clear. No pleural effusion or pneumothorax. No acute osseous findings. IMPRESSION: Hazy bibasilar opacities which could represent atelectasis or early infiltrate. Electronically Signed   By: Lavonia Dana M.D.   On: 03/10/2017 21:15    EKG: Independently reviewed. Rate 83, QTc 469  Echocardiogram 2011: EF 55-60% Grade I DD         Assessment/Plan  1. Melena:  In setting of heavy aspirin use. -PPI gtt -NPO  -GI consultation, appreciate cares   2. Anemia of acute blood loss:  Retic index 0.6, hypoproliferative.  Ferritin ultra low. -No transfusions -Feraheme given -Will discuss Epogen with Heme-Onc (benefit given low RPI? Or is that just from iron def?)  -Minimize phlebotomy -Supplemental O2  3. Acute on chronic diastolic CHF:  BNP elevated, mild edema on CXR. -Supplemental O2 -Furosemide 20 mg IV daily -K supplement -Strict I/Os, daily weights, telemetry  -Daily monitoring renal function -Check echocardiogram  4. Hypertension:  -Continue ACEi  5. Hypothyroidism:  -Continue levothyroxine  6. Hyponatremia:  -Check osmolalities, urine sodium      DVT prophylaxis: SCds  Code Status: FULL  Family Communication: Wife at bedside  Disposition Plan: Anticipate IV PPI and consult to GI for endoscopy.  Will discuss anemia treatment with Heme. Consults called: GI, Dr. Oletta Lamas Admission status: INPATIENT     Medical decision making: Patient seen at 10:50 PM on  03/10/2017.  The patient was discussed with Dr. Jeneen Rinks.  What exists of the patient's chart was reviewed in depth and summarized above.  Clinical condition: stable.        Edwin Dada [facility] Pager [phone]  At the time of admission, it appears that the appropriate admission status for this patient is INPATIENT. This is judged to be reasonable and necessary in order to provide the required intensity of service to ensure the patient's safety given the presenting symptoms, physical exam findings, and initial radiographic and laboratory data in the context of their chronic comorbidities.  Together, these circumstances are felt to place him at high risk for further clinical deterioration threatening life, limb, or organ.   Patient requires inpatient status due to high intensity of service, high risk for further deterioration and high frequency of surveillance required because of this acute illness that poses a threat to life, limb or bodily function.  I certify that at the point of admission it is my clinical judgment that the patient will require inpatient hospital care spanning beyond 2 midnights from the point of admission and that early discharge would result in unnecessary risk of decompensation and readmission or threat to life, limb or bodily function.

## 2017-03-12 ENCOUNTER — Encounter (HOSPITAL_COMMUNITY)
Admission: EM | Disposition: A | Discharge status: Home Health | Payer: Self-pay | Source: Home / Self Care | Attending: Internal Medicine

## 2017-03-12 DIAGNOSIS — D509 Iron deficiency anemia, unspecified: Secondary | ICD-10-CM

## 2017-03-12 DIAGNOSIS — D508 Other iron deficiency anemias: Secondary | ICD-10-CM

## 2017-03-12 HISTORY — DX: Iron deficiency anemia, unspecified: D50.9

## 2017-03-12 LAB — CBC
HEMATOCRIT: 17.6 % — AB (ref 39.0–52.0)
Hemoglobin: 5.2 g/dL — CL (ref 13.0–17.0)
MCH: 21.5 pg — AB (ref 26.0–34.0)
MCHC: 29.5 g/dL — ABNORMAL LOW (ref 30.0–36.0)
MCV: 72.7 fL — AB (ref 78.0–100.0)
PLATELETS: 311 10*3/uL (ref 150–400)
RBC: 2.42 MIL/uL — ABNORMAL LOW (ref 4.22–5.81)
RDW: 18.5 % — AB (ref 11.5–15.5)
WBC: 5 10*3/uL (ref 4.0–10.5)

## 2017-03-12 LAB — BASIC METABOLIC PANEL
Anion gap: 6 (ref 5–15)
BUN: 11 mg/dL (ref 6–20)
CHLORIDE: 106 mmol/L (ref 101–111)
CO2: 22 mmol/L (ref 22–32)
CREATININE: 1.12 mg/dL (ref 0.61–1.24)
Calcium: 8.9 mg/dL (ref 8.9–10.3)
GFR calc Af Amer: >60 mL/min (ref >60)
GFR calc non Af Amer: >60 mL/min (ref >60)
GLUCOSE: 86 mg/dL (ref 65–99)
Potassium: 3.9 mmol/L (ref 3.5–5.1)
SODIUM: 134 mmol/L — AB (ref 135–145)

## 2017-03-12 SURGERY — EGD (ESOPHAGOGASTRODUODENOSCOPY)
Anesthesia: Choice

## 2017-03-12 SURGERY — ESOPHAGOGASTRODUODENOSCOPY (EGD) WITH PROPOFOL
Anesthesia: Monitor Anesthesia Care | Laterality: Left

## 2017-03-12 MED ORDER — FOLIC ACID 1 MG PO TABS
1.0000 mg | ORAL_TABLET | Freq: Every day | ORAL | Status: DC
  Administered 2017-03-12 – 2017-03-18 (×6): 1 mg via ORAL
  Filled 2017-03-12 (×7): qty 1

## 2017-03-12 MED ORDER — DARBEPOETIN ALFA 60 MCG/0.3ML IJ SOSY
60.0000 ug | PREFILLED_SYRINGE | Freq: Once | INTRAMUSCULAR | Status: AC
  Administered 2017-03-12: 60 ug via SUBCUTANEOUS
  Filled 2017-03-12: qty 0.3

## 2017-03-12 NOTE — Progress Notes (Signed)
Subjective: No abdominal pain. No blood in stool or diarrhea.  Objective: Vital signs in last 24 hours: Temp:  [97.9 F (36.6 C)-98.3 F (36.8 C)] 98.2 F (36.8 C) (09/01 0552) Pulse Rate:  [74-77] 77 (09/01 0552) Resp:  [11-22] 18 (09/01 0552) BP: (126-173)/(49-131) 126/58 (09/01 0552) SpO2:  [89 %-100 %] 98 % (09/01 0552) Weight:  [185 lb (83.9 kg)-188 lb 15 oz (85.7 kg)] 185 lb (83.9 kg) (09/01 0552) Weight change: -1 lb 1.1 oz (-0.483 kg) Last BM Date: 03/11/17  PE: GEN:  Very pale but NAD HEENT:  Conjunctival pallor ABD:  Soft, non-tender  Lab Results: CBC    Component Value Date/Time   WBC 5.0 03/12/2017 0509   RBC 2.42 (L) 03/12/2017 0509   HGB 5.2 (LL) 03/12/2017 0509   HCT 17.6 (L) 03/12/2017 0509   PLT 311 03/12/2017 0509   MCV 72.7 (L) 03/12/2017 0509   MCH 21.5 (L) 03/12/2017 0509   MCHC 29.5 (L) 03/12/2017 0509   RDW 18.5 (H) 03/12/2017 0509   RDW 17.2 (H) 07/29/2014 1307   LYMPHSABS 0.5 (L) 03/10/2017 1846   MONOABS 0.3 03/10/2017 1846   EOSABS 0.1 03/10/2017 1846   BASOSABS 0.0 03/10/2017 1846   CMP     Component Value Date/Time   NA 134 (L) 03/12/2017 0509   NA 133 (L) 07/29/2014 1307   K 3.9 03/12/2017 0509   CL 106 03/12/2017 0509   CO2 22 03/12/2017 0509   GLUCOSE 86 03/12/2017 0509   BUN 11 03/12/2017 0509   BUN 11 07/29/2014 1307   CREATININE 1.12 03/12/2017 0509   CALCIUM 8.9 03/12/2017 0509   PROT 7.9 03/10/2017 1846   PROT 8.0 07/29/2014 1307   ALBUMIN 4.7 03/10/2017 1846   ALBUMIN 5.0 (H) 07/29/2014 1307   AST 23 03/10/2017 1846   ALT 18 03/10/2017 1846   ALKPHOS 55 03/10/2017 1846   BILITOT 0.8 03/10/2017 1846   GFRNONAA >60 03/12/2017 0509   GFRAA >60 03/12/2017 0509   Assessment:  1.  Symptomatic anemia with some dark stools for several weeks.  GI contribution to anemia seems more protracted for many weeks, and not acute destabilizing. 2.  Jehovah's witness, no blood transfusions.  Plan:  1.  Clear liquid diet. 2.   PPI. 3.  Appreciate hematology input. 4.  As per my note yesterday, would hold off on endoscopy at this time, unless an acute destabilizing emergency develops.  Ideally would like to wait until Hgb in the 7 range prior to consideration of endoscopy. 5.  Patient agrees with above approach, and actually prefers waiting a bit, for reasons outlined, prior to doing the endoscopy. 6.  Will follow.   Landry Dyke 03/12/2017, 11:08 AM   Cell [phone] If no answer or after 5 PM call [phone]

## 2017-03-12 NOTE — Progress Notes (Signed)
Patient ID: Scott Newman, male   DOB: 1946-01-09, 71 y.o.   MRN: [id number]  PROGRESS NOTE    Aharon Carriere Talbot  STM:[id number] DOB: 07/18/1945 DOA: 03/10/2017  PCP: Jani Gravel, MD   Brief Narrative:   71 year old male with past medical history significant for hypertension, hypothyroidism, prostate cancer and chronic diastolic CHF who presented to ED with worsening shortness of breath and weakness as well as melena for 3 weeks. Pt usually take aspirin but stopped when melena started. Patient was hemodynamic stable on the admission. Hemoglobin was 5.9. Fecal occult blood test was positive. 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 Feraheme and Protonix infusion started. GI and Hematology consulted.  Assessment & Plan:   Principal Problem: Acute GI bleed with melena / acute blood loss anemia / Iron deficiency anemia  - Because patient is Jehovah's Witness he is not accepting blood transfusion - Pt is very symptomatic from severe anemia - Has received 1 dose of Feraheme 8/30; per hematology, will give 1-2 more dose every week - Also, hematology recommended aranesp 60 mcg injection once ordered for today  - Per GI, not planning on EGD at this time or until hgb at least 7 - Hgb 5.2 this am - Continue protonix drip  - CBC in am  Active Problems: Acute on chronic diastolic CHF - Reasonably compensated  - ECHO on this admission with normal EF, grade 2 DD  Essential hypertension - Continue Lisinopril   Hypothyroidism - Continue synthroid   DVT prophylaxis: SCD's Code Status: full code  Family Communication: wife at the bedisde Disposition Plan: home once hgb stable    Consultants:  GI  Hematology   Procedures:   ECHO 8/31 - normal EF, grade 2 DD   Antimicrobials:   None    Subjective: Feels better.  Objective: Vitals:   03/11/17 1445 03/11/17 1635 03/11/17 2126 03/12/17 0552  BP: (!) 163/84 (!) 140/53 (!) 131/49 (!) 126/58  Pulse:   75 76 77  Resp: 20 18 20 18   Temp:  98.3 F (36.8 C) 97.9 F (36.6 C) 98.2 F (36.8 C)  TempSrc:  Oral Oral Oral  SpO2: 97% 98% 99% 98%  Weight:  85.7 kg (188 lb 15 oz)  83.9 kg (185 lb)  Height:  5\' 9"  (1.753 m)      Intake/Output Summary (Last 24 hours) at 03/12/17 1131 Last data filed at 03/12/17 0800  Gross per 24 hour  Intake           296.67 ml  Output              380 ml  Net           -83.33 ml   Filed Weights   03/10/17 1731 03/11/17 1635 03/12/17 0552  Weight: 86.2 kg (190 lb) 85.7 kg (188 lb 15 oz) 83.9 kg (185 lb)    Physical Exam  Constitutional: Appears well-developed and well-nourished. No distress.  CVS: RRR, S1/S2 +, no murmurs, no gallops, no carotid bruit.  Pulmonary: Effort and breath sounds normal, no stridor, rhonchi, wheezes, rales.  Abdominal: Soft. BS +,  no distension, tenderness, rebound or guarding.  Musculoskeletal: Normal range of motion. No edema and no tenderness.  Lymphadenopathy: No lymphadenopathy noted, cervical, inguinal. Neuro: Alert. Normal reflexes, muscle tone coordination. No cranial nerve deficit. Skin: Skin is warm and dry.  Skin is pale  Psychiatric: Normal mood and affect. Behavior, judgment, thought content normal.     Data Reviewed:  I have personally reviewed following labs and imaging studies  CBC:  Recent Labs Lab 03/10/17 1846 03/11/17 0705 03/12/17 0509  WBC 4.2 5.2 5.0  NEUTROABS 3.3  --   --   HGB 5.9* 5.0* 5.2*  HCT 20.0* 16.7* 17.6*  MCV 72.2* 70.8* 72.7*  PLT 329 288 130   Basic Metabolic Panel:  Recent Labs Lab 03/10/17 1846 03/11/17 0705 03/12/17 0509  NA 130* 132* 134*  K 4.2 3.8 3.9  CL 101 103 106  CO2 18* 21* 22  GLUCOSE 100* 89 86  BUN 19 17 11   CREATININE 1.01 1.11 1.12  CALCIUM 9.1 8.9 8.9   GFR: Estimated Creatinine Clearance: 59.6 mL/min (by C-G formula based on SCr of 1.12 mg/dL). Liver Function Tests:  Recent Labs Lab 03/10/17 1846  AST 23  ALT 18  ALKPHOS 55  BILITOT  0.8  PROT 7.9  ALBUMIN 4.7   No results for input(s): LIPASE, AMYLASE in the last 168 hours. No results for input(s): AMMONIA in the last 168 hours. Coagulation Profile:  Recent Labs Lab 03/10/17 1846  INR 1.10   Cardiac Enzymes:  Recent Labs Lab 03/10/17 1846  TROPONINI <0.03   BNP (last 3 results) No results for input(s): PROBNP in the last 8760 hours. HbA1C: No results for input(s): HGBA1C in the last 72 hours. CBG: No results for input(s): GLUCAP in the last 168 hours. Lipid Profile: No results for input(s): CHOL, HDL, LDLCALC, TRIG, CHOLHDL, LDLDIRECT in the last 72 hours. Thyroid Function Tests: No results for input(s): TSH, T4TOTAL, FREET4, T3FREE, THYROIDAB in the last 72 hours. Anemia Panel:  Recent Labs  03/10/17 1846  VITAMINB12 460  FOLATE 30.0  FERRITIN 4*  TIBC 475*  IRON 12*  RETICCTPCT 2.6   Urine analysis:    Component Value Date/Time   COLORURINE YELLOW 04/30/2011 1307   APPEARANCEUR CLEAR 04/30/2011 1307   LABSPEC 1.015 04/30/2011 1307   PHURINE 6.5 04/30/2011 1307   GLUCOSEU NEGATIVE 04/30/2011 1307   HGBUR NEGATIVE 04/30/2011 [place] 04/30/2011 [place] 04/30/2011 1307   PROTEINUR NEGATIVE 04/30/2011 1307   UROBILINOGEN 1.0 04/30/2011 1307   NITRITE NEGATIVE 04/30/2011 1307   LEUKOCYTESUR NEGATIVE 04/30/2011 1307   Sepsis Labs: @LABRCNTIP (procalcitonin:4,lacticidven:4)   )No results found for this or any previous visit (from the past 240 hour(s)).    Radiology Studies: Dg Chest Port 1 View  Result Date: 03/10/2017 CLINICAL DATA:  Shortness of breath, CHF, anemia, hypertension EXAM: PORTABLE CHEST 1 VIEW COMPARISON:  Portable exam 1850 hours compared to 09/23/2010 FINDINGS: Normal heart size and mediastinal contours. Atherosclerotic calcification aorta. Hazy interstitial opacities in the mid to lower lungs bilaterally which could represent atelectasis or infiltrate. Upper lungs clear. No  pleural effusion or pneumothorax. No acute osseous findings. IMPRESSION: Hazy bibasilar opacities which could represent atelectasis or early infiltrate. Electronically Signed   By: Lavonia Dana M.D.   On: 03/10/2017 21:15       Scheduled Meds: . levothyroxine  50 mcg Oral QAC breakfast  . lisinopril  20 mg Oral Daily  . potassium chloride  20 mEq Oral Daily   Continuous Infusions: . pantoprozole (PROTONIX) infusion 8 mg/hr (03/12/17 0420)     LOS: 2 days    Time spent: 25 minutes  Greater than 50% of the time spent on counseling and coordinating the care.   Leisa Lenz, MD [facility] Pager [phone]  If 7PM-7AM, please contact night-coverage [email or web address] Password [email or web address] 03/12/2017, 11:31 AM

## 2017-03-13 LAB — CBC
HEMATOCRIT: 19.2 % — AB (ref 39.0–52.0)
Hemoglobin: 5.5 g/dL — CL (ref 13.0–17.0)
MCH: 21.6 pg — AB (ref 26.0–34.0)
MCHC: 28.6 g/dL — AB (ref 30.0–36.0)
MCV: 75.3 fL — AB (ref 78.0–100.0)
PLATELETS: 331 10*3/uL (ref 150–400)
RBC: 2.55 MIL/uL — ABNORMAL LOW (ref 4.22–5.81)
RDW: 18.9 % — AB (ref 11.5–15.5)
WBC: 7.2 10*3/uL (ref 4.0–10.5)

## 2017-03-13 LAB — BASIC METABOLIC PANEL
Anion gap: 9 (ref 5–15)
BUN: 12 mg/dL (ref 6–20)
CHLORIDE: 105 mmol/L (ref 101–111)
CO2: 20 mmol/L — ABNORMAL LOW (ref 22–32)
CREATININE: 1.1 mg/dL (ref 0.61–1.24)
Calcium: 9.1 mg/dL (ref 8.9–10.3)
GFR calc Af Amer: >60 mL/min (ref >60)
GFR calc non Af Amer: >60 mL/min (ref >60)
GLUCOSE: 77 mg/dL (ref 65–99)
Potassium: 4 mmol/L (ref 3.5–5.1)
SODIUM: 134 mmol/L — AB (ref 135–145)

## 2017-03-13 MED ORDER — PANTOPRAZOLE SODIUM 40 MG IV SOLR
40.0000 mg | Freq: Two times a day (BID) | INTRAVENOUS | Status: DC
Start: 2017-03-13 — End: 2017-03-17
  Administered 2017-03-13 – 2017-03-17 (×9): 40 mg via INTRAVENOUS
  Filled 2017-03-13 (×10): qty 40

## 2017-03-13 MED ORDER — BOOST / RESOURCE BREEZE PO LIQD
1.0000 | Freq: Three times a day (TID) | ORAL | Status: DC
  Administered 2017-03-14 – 2017-03-18 (×10): 1 via ORAL

## 2017-03-13 NOTE — Progress Notes (Signed)
Patient ID: Scott Newman, male   DOB: 11/16/1945, 71 y.o.   MRN: [id number]  PROGRESS NOTE    Scott Newman  ZRA:[id number] DOB: Jun 05, 1946 DOA: 03/10/2017  PCP: Jani Gravel, MD   Brief Narrative:   71 year old male with past medical history significant for hypertension, hypothyroidism, prostate cancer and chronic diastolic CHF who presented to ED with worsening shortness of breath and weakness as well as melena for 3 weeks. Pt usually take aspirin but stopped when melena started. Patient was hemodynamic stable on the admission. Hemoglobin was 5.9. Fecal occult blood test was positive. 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 Feraheme and Protonix infusion started. GI and Hematology consulted.  Assessment & Plan:   Principal Problem: Acute GI bleed with melena / acute blood loss anemia / Iron deficiency anemia  - Because patient is Jehovah's Witness he is not accepting blood transfusion - Pt is very symptomatic from severe anemia - Has received 1 dose of Feraheme 8/30; per hematology, will give 1-2 more dose every week - Also, hematology recommended aranesp 60 mcg injection once ordered for today  - Per GI, not planning on EGD at this time or until hgb at least 7 - Hemoglobin pending this morning - No reports of blood in the stool anymore - Continue Protonix - Diet as tolerated   Active Problems: Acute on chronic diastolic CHF - ECHO on this admission with normal EF, grade 2 DD - Stable respiratory status  Essential hypertension - Continue lisinopril  Hypothyroidism - Continue Synthroid  DVT prophylaxis: SCDs Code Status: full code  Family Communication: Wife at bedside Disposition Plan: Home once hemoglobin stable   Consultants:  GI  Hematology   Procedures:   ECHO 8/31 - normal EF, grade 2 DD   Antimicrobials:   None    Subjective: No overnight events.   Objective: Vitals:   03/12/17 0552 03/12/17 1256 03/12/17 2118  03/13/17 0425  BP: (!) 126/58 (!) 114/46 (!) 129/55 (!) 133/57  Pulse: 77 71 74 72  Resp: 18 16 18 16   Temp: 98.2 F (36.8 C) 98.3 F (36.8 C) 98.1 F (36.7 C) 98.5 F (36.9 C)  TempSrc: Oral Oral Oral Oral  SpO2: 98% 92% 98% 97%  Weight: 83.9 kg (185 lb)   79.1 kg (174 lb 6.1 oz)  Height:        Intake/Output Summary (Last 24 hours) at 03/13/17 0853 Last data filed at 03/13/17 0600  Gross per 24 hour  Intake              600 ml  Output              875 ml  Net             -275 ml   Filed Weights   03/11/17 1635 03/12/17 0552 03/13/17 0425  Weight: 85.7 kg (188 lb 15 oz) 83.9 kg (185 lb) 79.1 kg (174 lb 6.1 oz)    Physical Exam  Constitutional: Appears well-developed and well-nourished. No distress.  CVS: RRR, S1/S2 +, no murmurs, no gallops, no carotid bruit.  Pulmonary: Effort and breath sounds normal, no stridor, rhonchi, wheezes, rales.  Abdominal: Soft. BS +,  no distension, tenderness, rebound or guarding.  Musculoskeletal: Normal range of motion. No edema and no tenderness.  Lymphadenopathy: No lymphadenopathy noted, cervical, inguinal. Neuro: Alert. Normal reflexes, muscle tone coordination. No cranial nerve deficit. Skin: Skin is warm and dry. Pale skin Psychiatric: Normal mood and affect. Behavior,  judgment, thought content normal.    Data Reviewed: I have personally reviewed following labs and imaging studies  CBC:  Recent Labs Lab 03/10/17 1846 03/11/17 0705 03/12/17 0509  WBC 4.2 5.2 5.0  NEUTROABS 3.3  --   --   HGB 5.9* 5.0* 5.2*  HCT 20.0* 16.7* 17.6*  MCV 72.2* 70.8* 72.7*  PLT 329 288 130   Basic Metabolic Panel:  Recent Labs Lab 03/10/17 1846 03/11/17 0705 03/12/17 0509 03/13/17 0407  NA 130* 132* 134* 134*  K 4.2 3.8 3.9 4.0  CL 101 103 106 105  CO2 18* 21* 22 20*  GLUCOSE 100* 89 86 77  BUN 19 17 11 12   CREATININE 1.01 1.11 1.12 1.10  CALCIUM 9.1 8.9 8.9 9.1   GFR: Estimated Creatinine Clearance: 60.7 mL/min (by C-G formula  based on SCr of 1.1 mg/dL). Liver Function Tests:  Recent Labs Lab 03/10/17 1846  AST 23  ALT 18  ALKPHOS 55  BILITOT 0.8  PROT 7.9  ALBUMIN 4.7   No results for input(s): LIPASE, AMYLASE in the last 168 hours. No results for input(s): AMMONIA in the last 168 hours. Coagulation Profile:  Recent Labs Lab 03/10/17 1846  INR 1.10   Cardiac Enzymes:  Recent Labs Lab 03/10/17 1846  TROPONINI <0.03   BNP (last 3 results) No results for input(s): PROBNP in the last 8760 hours. HbA1C: No results for input(s): HGBA1C in the last 72 hours. CBG: No results for input(s): GLUCAP in the last 168 hours. Lipid Profile: No results for input(s): CHOL, HDL, LDLCALC, TRIG, CHOLHDL, LDLDIRECT in the last 72 hours. Thyroid Function Tests: No results for input(s): TSH, T4TOTAL, FREET4, T3FREE, THYROIDAB in the last 72 hours. Anemia Panel:  Recent Labs  03/10/17 1846  VITAMINB12 460  FOLATE 30.0  FERRITIN 4*  TIBC 475*  IRON 12*  RETICCTPCT 2.6   Urine analysis:    Component Value Date/Time   COLORURINE YELLOW 04/30/2011 1307   APPEARANCEUR CLEAR 04/30/2011 1307   LABSPEC 1.015 04/30/2011 1307   PHURINE 6.5 04/30/2011 1307   GLUCOSEU NEGATIVE 04/30/2011 1307   HGBUR NEGATIVE 04/30/2011 [place] 04/30/2011 [place] 04/30/2011 1307   PROTEINUR NEGATIVE 04/30/2011 1307   UROBILINOGEN 1.0 04/30/2011 1307   NITRITE NEGATIVE 04/30/2011 1307   LEUKOCYTESUR NEGATIVE 04/30/2011 1307   Sepsis Labs: @LABRCNTIP (procalcitonin:4,lacticidven:4)   )No results found for this or any previous visit (from the past 240 hour(s)).    Radiology Studies: Dg Chest Port 1 View  Result Date: 03/10/2017 CLINICAL DATA:  Shortness of breath, CHF, anemia, hypertension EXAM: PORTABLE CHEST 1 VIEW COMPARISON:  Portable exam 1850 hours compared to 09/23/2010 FINDINGS: Normal heart size and mediastinal contours. Atherosclerotic calcification aorta. Hazy  interstitial opacities in the mid to lower lungs bilaterally which could represent atelectasis or infiltrate. Upper lungs clear. No pleural effusion or pneumothorax. No acute osseous findings. IMPRESSION: Hazy bibasilar opacities which could represent atelectasis or early infiltrate. Electronically Signed   By: Lavonia Dana M.D.   On: 03/10/2017 21:15       Scheduled Meds: . folic acid  1 mg Oral Daily  . levothyroxine  50 mcg Oral QAC breakfast  . lisinopril  20 mg Oral Daily  . potassium chloride  20 mEq Oral Daily   Continuous Infusions: . pantoprozole (PROTONIX) infusion 8 mg/hr (03/13/17 0454)     LOS: 3 days    Time spent: 25 minutes  Greater than 50% of the time spent on  counseling and coordinating the care.   Leisa Lenz, MD [facility] Pager [phone]  If 7PM-7AM, please contact night-coverage [email or web address] Password [facility] 03/13/2017, 8:53 AM

## 2017-03-13 NOTE — Progress Notes (Signed)
Subjective: No abdominal pain. No blood in stool x 2 days.  Objective: Vital signs in last 24 hours: Temp:  [98.1 F (36.7 C)-98.5 F (36.9 C)] 98.5 F (36.9 C) (09/02 0425) Pulse Rate:  [71-74] 72 (09/02 0425) Resp:  [16-18] 16 (09/02 0425) BP: (114-133)/(46-57) 133/57 (09/02 0425) SpO2:  [92 %-98 %] 97 % (09/02 0425) Weight:  [174 lb 6.1 oz (79.1 kg)] 174 lb 6.1 oz (79.1 kg) (09/02 0425) Weight change: -14 lb 8.8 oz (-6.6 kg) Last BM Date: 03/12/17  PE: GEN:  Deconditioned-appearing and pale but NAD  Lab Results: CBC    Component Value Date/Time   WBC 7.2 03/13/2017 0407   RBC 2.55 (L) 03/13/2017 0407   HGB 5.5 (LL) 03/13/2017 0407   HCT 19.2 (L) 03/13/2017 0407   PLT 331 03/13/2017 0407   MCV 75.3 (L) 03/13/2017 0407   MCH 21.6 (L) 03/13/2017 0407   MCHC 28.6 (L) 03/13/2017 0407   RDW 18.9 (H) 03/13/2017 0407   RDW 17.2 (H) 07/29/2014 1307   LYMPHSABS 0.5 (L) 03/10/2017 1846   MONOABS 0.3 03/10/2017 1846   EOSABS 0.1 03/10/2017 1846   BASOSABS 0.0 03/10/2017 1846   Assessment:  1.  Symptomatic anemia with some dark stools for several weeks.  GI contribution to anemia seems more protracted for many weeks, and not acute destabilizing.  No blood in stool for past 2 days. 2.  Jehovah's witness, no blood transfusions.  Plan:  1.  Advance to full liquid diet. 2.  Continue PPI; can transition to oral formulation. 3.  As previously outlined, would hold off on endoscopy at this time, unless an acute destabilizing emergency develops.  Ideally would like to wait until Hgb in the 7 range prior to consideration of endoscopy.  If patient has ongoing uptrend in Hgb without overt bleeding over the next couple days, might consider discharge home with close outpatient follow-up and endoscopy once Hgb in the 7 range. 4.  Will follow.   Landry Dyke 03/13/2017, 10:36 AM   Cell [phone] If no answer or after 5 PM call [phone]

## 2017-03-13 NOTE — Progress Notes (Signed)
Initial Nutrition Assessment  INTERVENTION:   -Provide Boost Breeze po TID, each supplement provides 250 kcal and 9 grams of protein -Encourage PO intake -RD will continue to monitor  NUTRITION DIAGNOSIS:   Inadequate oral intake related to poor appetite (GIB) as evidenced by per patient/family report.  GOAL:   Patient will meet greater than or equal to 90% of their needs  MONITOR:   PO intake, Supplement acceptance, Labs, Weight trends, I & O's  REASON FOR ASSESSMENT:   Malnutrition Screening Tool    ASSESSMENT:   71 year old male with past medical history significant for hypertension, hypothyroidism, prostate cancer and chronic diastolic CHF who presented to ED with worsening shortness of breath and weakness as well as melena for 3 weeks. Pt usually take aspirin but stopped when melena started.  Pt reports not eating well since his GI bleeding began. Pt a bit confused and did not answer all questions. Easily distracted. Pt with history of GI problems, struggled with constipation until he received radiation therapy which caused loose stools. Pt states he has not been eating well for ~60 days now. Pt is now on full liquids and is not eating much. Encouraged pt to try some liquids. He is willing to try Boost Breeze supplements.   Per chart review, pt has lost 19 lb since 06/29/16 (10% wt loss x 9 months, insignificant for time frame). Nutrition-Focused physical exam completed. Findings are no fat depletion, mild muscle depletion, and no edema.   Medications: Folic acid tablet daily, IV Protonix BID, K-DUR tablet daily Labs reviewed: Low Na  Diet Order:  Diet full liquid Room service appropriate? Yes; Fluid consistency: Thin  Skin:  Reviewed, no issues  Last BM:  9/1  Height:   Ht Readings from Last 1 Encounters:  03/11/17 5\' 9"  (1.753 m)    Weight:   Wt Readings from Last 1 Encounters:  03/13/17 174 lb 6.1 oz (79.1 kg)    Ideal Body Weight:  72.7 kg  BMI:  Body  mass index is 25.75 kg/m.  Estimated Nutritional Needs:   Kcal:  1900-2100  Protein:  90-100g  Fluid:  1.9L/day  EDUCATION NEEDS:   Education needs addressed  Clayton Bibles, MS, RD, LDN Pager: [phone] After Hours Pager: [phone]

## 2017-03-14 LAB — CBC
HEMATOCRIT: 20.8 % — AB (ref 39.0–52.0)
HEMOGLOBIN: 6.1 g/dL — AB (ref 13.0–17.0)
MCH: 22.5 pg — ABNORMAL LOW (ref 26.0–34.0)
MCHC: 29.3 g/dL — AB (ref 30.0–36.0)
MCV: 76.8 fL — AB (ref 78.0–100.0)
Platelets: 353 10*3/uL (ref 150–400)
RBC: 2.71 MIL/uL — ABNORMAL LOW (ref 4.22–5.81)
RDW: 19.5 % — AB (ref 11.5–15.5)
WBC: 7.2 10*3/uL (ref 4.0–10.5)

## 2017-03-14 NOTE — Progress Notes (Signed)
Hgb slowly trending up.  If we can get Hgb in 6.5-7.0 range, might consider EGD/Flex prior to hospital discharge.  [name] will revisit tomorrow.

## 2017-03-14 NOTE — Progress Notes (Signed)
Scott Newman   DOB:[birth year]   UY#:[id number]   DGL#:[id number]  Hematology follow up note   Subjective: Patient is hemoglobin slowly turning up, 6.1 today, he had 1 bowel movement yesterday, he had to strain with his bowel movements, had trace blood mixed in the stool. No other new complaints. He has not gotten out of bed yet.   Objective:  Vitals:   03/14/17 0539 03/14/17 1246  BP: (!) 144/54 (!) 120/53  Pulse: 70 73  Resp: 18 18  Temp: 98 F (36.7 C) 97.7 F (36.5 C)  SpO2: 100% 98%    Body mass index is 26.11 kg/m.  Intake/Output Summary (Last 24 hours) at 03/14/17 1313 Last data filed at 03/14/17 0539  Gross per 24 hour  Intake                0 ml  Output              500 ml  Net             -500 ml     Sclerae unicteric, appears pale   Oropharynx clear  No peripheral adenopathy  Lungs clear -- no rales or rhonchi  Heart regular rate and rhythm  Abdomen benign  MSK no focal spinal tenderness, no peripheral edema  Neuro nonfocal   CBG (last 3)  No results for input(s): GLUCAP in the last 72 hours.   Labs:  Urine Studies No results for input(s): UHGB, CRYS in the last 72 hours.  Invalid input(s): UACOL, UAPR, USPG, UPH, UTP, UGL, UKET, UBIL, UNIT, UROB, [place], UEPI, UWBC, [place]wayne Heck [place], [place]  Basic Metabolic Panel:  Recent Labs Lab 03/10/17 1846 03/11/17 0705 03/12/17 0509 03/13/17 0407  NA 130* 132* 134* 134*  K 4.2 3.8 3.9 4.0  CL 101 103 106 105  CO2 18* 21* 22 20*  GLUCOSE 100* 89 86 77  BUN 19 17 11 12   CREATININE 1.01 1.11 1.12 1.10  CALCIUM 9.1 8.9 8.9 9.1   GFR Estimated Creatinine Clearance: 60.7 mL/min (by C-G formula based on SCr of 1.1 mg/dL). Liver Function Tests:  Recent Labs Lab 03/10/17 1846  AST 23  ALT 18  ALKPHOS 55  BILITOT 0.8  PROT 7.9  ALBUMIN 4.7   No results for input(s): LIPASE, AMYLASE in the last 168 hours. No results for input(s): AMMONIA in the last 168 hours. Coagulation profile  Recent  Labs Lab 03/10/17 1846  INR 1.10    CBC:  Recent Labs Lab 03/10/17 1846 03/11/17 0705 03/12/17 0509 03/13/17 0407 03/14/17 0547  WBC 4.2 5.2 5.0 7.2 7.2  NEUTROABS 3.3  --   --   --   --   HGB 5.9* 5.0* 5.2* 5.5* 6.1*  HCT 20.0* 16.7* 17.6* 19.2* 20.8*  MCV 72.2* 70.8* 72.7* 75.3* 76.8*  PLT 329 288 311 331 353   Cardiac Enzymes:  Recent Labs Lab 03/10/17 1846  TROPONINI <0.03   BNP: Invalid input(s): POCBNP CBG: No results for input(s): GLUCAP in the last 168 hours. D-Dimer No results for input(s): DDIMER in the last 72 hours. Hgb A1c No results for input(s): HGBA1C in the last 72 hours. Lipid Profile No results for input(s): CHOL, HDL, LDLCALC, TRIG, CHOLHDL, LDLDIRECT in the last 72 hours. Thyroid function studies No results for input(s): TSH, T4TOTAL, T3FREE, THYROIDAB in the last 72 hours.  Invalid input(s): FREET3 Anemia work up No results for input(s): VITAMINB12, FOLATE, FERRITIN, TIBC, IRON, RETICCTPCT in the last 72 hours. Microbiology  No results found for this or any previous visit (from the past 240 hour(s)).    Studies:  No results found.  Assessment: 71 y.o. male with PMH Hypertension, hypothyroidism, prostate cancer and chronic diastolic CHF, presented to emergency room with worsening shortness of breath and weakness. He reports intermittent melena since April 2018. He was on high dose of aspirin for arthritis, which she stopped this GI bleeding. 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  1. Iron deficient anemia, likely secondary to GI bleeding  2. Acute GI bleeding with melena, acute blood loss anemia, possible related to NSAIDs 3. Chronic diastolic CHF 4. HTN  5. Jehovah witness  Plan:  -anemia improving after iv feraheme on 8/30 and aranesp on 9/1 -discharge per primary and GI  -if he remains to be in hospital, will give second dose feraheme on 9/5  -I anticipate his Hb will in 7's late this week, and continue  improving in the next few weeks -I'll follow-up him in my clinic.   Truitt Merle, MD 03/14/2017  1:13 PM

## 2017-03-14 NOTE — Progress Notes (Signed)
Patient ID: Scott Newman, male   DOB: September 02, 1945, 70 y.o.   MRN: [id number]  PROGRESS NOTE    Seabron Iannello Dashner  EZM:[id number] DOB: [birth year] DOA: 03/10/2017  PCP: Jani Gravel, MD   Brief Narrative:   71 year old male with past medical history significant for hypertension, hypothyroidism, prostate cancer and chronic diastolic CHF who presented to ED with worsening shortness of breath and weakness as well as melena for 3 weeks. Pt usually take aspirin but stopped when melena started. Patient was hemodynamic stable on the admission. Hemoglobin was 5.9. Fecal occult blood test was positive. 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 Feraheme and Protonix infusion started. GI and Hematology consulted.  Assessment & Plan:   Principal Problem: Acute GI bleed with melena / acute blood loss anemia / Iron deficiency anemia  - Because patient is Jehovah's Witness he is not accepting blood transfusion - Has received 1 dose of Feraheme 8/30; per hematology, will give 1-2 more dose every week - Also has gotten aranesp - Continue Protonix - Hgb 6.1 this am - Possible EGD if hgb at least 7 verus outpt follow up  Active Problems: Acute on chronic diastolic CHF - ECHO on this admission with normal EF, grade 2 DD - Stable resp status   Essential hypertension - Continue lisinopril   Hypothyroidism - Continue synthroid   DVT prophylaxis: SCD's Code Status: full code  Family Communication: wife at bedside  Disposition Plan: home once cleared by GI   Consultants:  GI  Hematology   Procedures:   ECHO 8/31 - normal EF, grade 2 DD   Antimicrobials:   None    Subjective: No overnight events.   Objective: Vitals:   03/13/17 1130 03/13/17 1427 03/13/17 2130 03/14/17 0539  BP: (!) 134/59 (!) 130/49 (!) 121/55 (!) 144/54  Pulse: 80 83 69 70  Resp:  16 18 18   Temp:  98.1 F (36.7 C) 98.2 F (36.8 C) 98 F (36.7 C)  TempSrc:  Oral Oral Oral  SpO2:   98% 97% 100%  Weight:    80.2 kg (176 lb 12.9 oz)  Height:        Intake/Output Summary (Last 24 hours) at 03/14/17 0934 Last data filed at 03/14/17 0539  Gross per 24 hour  Intake                0 ml  Output              500 ml  Net             -500 ml   Filed Weights   03/12/17 0552 03/13/17 0425 03/14/17 0539  Weight: 83.9 kg (185 lb) 79.1 kg (174 lb 6.1 oz) 80.2 kg (176 lb 12.9 oz)    Physical Exam  Constitutional: Appears well-developed and well-nourished. No distress.  CVS: RRR, S1/S2 + Pulmonary: Effort and breath sounds normal, no stridor, rhonchi, wheezes, rales.  Abdominal: Soft. BS +,  no distension, tenderness, rebound or guarding.  Musculoskeletal: Normal range of motion. No edema and no tenderness.  Lymphadenopathy: No lymphadenopathy noted, cervical, inguinal. Neuro: Alert. Normal reflexes, muscle tone coordination. No cranial nerve deficit. Skin: Skin is warm and dry. No rash noted. Not diaphoretic. No erythema. No pallor.  Psychiatric: Normal mood and affect. Behavior, judgment, thought content normal.      Data Reviewed: I have personally reviewed following labs and imaging studies  CBC:  Recent Labs Lab 03/10/17 1846 03/11/17 0705 03/12/17 0509 03/13/17  0407 03/14/17 0547  WBC 4.2 5.2 5.0 7.2 7.2  NEUTROABS 3.3  --   --   --   --   HGB 5.9* 5.0* 5.2* 5.5* 6.1*  HCT 20.0* 16.7* 17.6* 19.2* 20.8*  MCV 72.2* 70.8* 72.7* 75.3* 76.8*  PLT 329 288 311 331 937   Basic Metabolic Panel:  Recent Labs Lab 03/10/17 1846 03/11/17 0705 03/12/17 0509 03/13/17 0407  NA 130* 132* 134* 134*  K 4.2 3.8 3.9 4.0  CL 101 103 106 105  CO2 18* 21* 22 20*  GLUCOSE 100* 89 86 77  BUN 19 17 11 12   CREATININE 1.01 1.11 1.12 1.10  CALCIUM 9.1 8.9 8.9 9.1   GFR: Estimated Creatinine Clearance: 60.7 mL/min (by C-G formula based on SCr of 1.1 mg/dL). Liver Function Tests:  Recent Labs Lab 03/10/17 1846  AST 23  ALT 18  ALKPHOS 55  BILITOT 0.8  PROT 7.9    ALBUMIN 4.7   No results for input(s): LIPASE, AMYLASE in the last 168 hours. No results for input(s): AMMONIA in the last 168 hours. Coagulation Profile:  Recent Labs Lab 03/10/17 1846  INR 1.10   Cardiac Enzymes:  Recent Labs Lab 03/10/17 1846  TROPONINI <0.03   BNP (last 3 results) No results for input(s): PROBNP in the last 8760 hours. HbA1C: No results for input(s): HGBA1C in the last 72 hours. CBG: No results for input(s): GLUCAP in the last 168 hours. Lipid Profile: No results for input(s): CHOL, HDL, LDLCALC, TRIG, CHOLHDL, LDLDIRECT in the last 72 hours. Thyroid Function Tests: No results for input(s): TSH, T4TOTAL, FREET4, T3FREE, THYROIDAB in the last 72 hours. Anemia Panel: No results for input(s): VITAMINB12, FOLATE, FERRITIN, TIBC, IRON, RETICCTPCT in the last 72 hours. Urine analysis:    Component Value Date/Time   COLORURINE YELLOW 04/30/2011 1307   APPEARANCEUR CLEAR 04/30/2011 1307   LABSPEC 1.015 04/30/2011 1307   PHURINE 6.5 04/30/2011 1307   GLUCOSEU NEGATIVE 04/30/2011 1307   HGBUR NEGATIVE 04/30/2011 [place] 04/30/2011 [place] 04/30/2011 1307   PROTEINUR NEGATIVE 04/30/2011 1307   UROBILINOGEN 1.0 04/30/2011 1307   NITRITE NEGATIVE 04/30/2011 1307   LEUKOCYTESUR NEGATIVE 04/30/2011 1307   Sepsis Labs: @LABRCNTIP (procalcitonin:4,lacticidven:4)   )No results found for this or any previous visit (from the past 240 hour(s)).    Radiology Studies: Dg Chest Port 1 View  Result Date: 03/10/2017 CLINICAL DATA:  Shortness of breath, CHF, anemia, hypertension EXAM: PORTABLE CHEST 1 VIEW COMPARISON:  Portable exam 1850 hours compared to 09/23/2010 FINDINGS: Normal heart size and mediastinal contours. Atherosclerotic calcification aorta. Hazy interstitial opacities in the mid to lower lungs bilaterally which could represent atelectasis or infiltrate. Upper lungs clear. No pleural effusion or pneumothorax. No  acute osseous findings. IMPRESSION: Hazy bibasilar opacities which could represent atelectasis or early infiltrate. Electronically Signed   By: Lavonia Dana M.D.   On: 03/10/2017 21:15       Scheduled Meds: . feeding supplement  1 Container Oral TID BM  . folic acid  1 mg Oral Daily  . levothyroxine  50 mcg Oral QAC breakfast  . lisinopril  20 mg Oral Daily  . pantoprazole (PROTONIX) IV  40 mg Intravenous Q12H  . potassium chloride  20 mEq Oral Daily   Continuous Infusions:    LOS: 4 days    Time spent: 15 minutes  Greater than 50% of the time spent on counseling and coordinating the care.   Leisa Lenz, MD  [facility] Pager [phone]  If 7PM-7AM, please contact night-coverage [email or web address] Password [facility] 03/14/2017, 9:34 AM

## 2017-03-15 LAB — CBC
HCT: 23 % — ABNORMAL LOW (ref 39.0–52.0)
HEMOGLOBIN: 6.7 g/dL — AB (ref 13.0–17.0)
MCH: 22.6 pg — AB (ref 26.0–34.0)
MCHC: 29.1 g/dL — ABNORMAL LOW (ref 30.0–36.0)
MCV: 77.7 fL — AB (ref 78.0–100.0)
Platelets: 353 10*3/uL (ref 150–400)
RBC: 2.96 MIL/uL — AB (ref 4.22–5.81)
RDW: 22.3 % — ABNORMAL HIGH (ref 11.5–15.5)
WBC: 6.5 10*3/uL (ref 4.0–10.5)

## 2017-03-15 NOTE — Progress Notes (Signed)
[facility] Gastroenterology Progress Note  Scott Newman 71 y.o. Nov 24, 1945  CC:  Symptomatic anemia   Subjective: Patient's diarrhea is resolved. Had the small bowel movement this morning which was dark brown in color. Denied abdominal pain, nausea or vomiting.  ROS : Denied chest pain and shortness of breath. His weakness is improving.  Objective: Vital signs in last 24 hours: Vitals:   03/15/17 0513 03/15/17 0720  BP: (!) 129/51 (!) 126/50  Pulse: 73 71  Resp: 16 14  Temp: 97.6 F (36.4 C) 98.1 F (36.7 C)  SpO2: 97% 97%    Physical Exam:  General:  Alert, cooperative, no distress, appears stated age  Head:  Normocephalic, without obvious abnormality, atraumatic  Eyes:  , EOM's intact,   Lungs:   Occasional expiratory rhonchi   Heart:  Regular rate and rhythm, S1, S2 normal  Abdomen:   Soft, non-tender, bowel sounds active all four quadrants,  no masses, No peritoneal signs Nondistended   Extremities: Extremities normal, atraumatic, no  edema  Pulses: 2+ and symmetric    Lab Results:  Recent Labs  03/13/17 0407  NA 134*  K 4.0  CL 105  CO2 20*  GLUCOSE 77  BUN 12  CREATININE 1.10  CALCIUM 9.1   No results for input(s): AST, ALT, ALKPHOS, BILITOT, PROT, ALBUMIN in the last 72 hours.  Recent Labs  03/14/17 0547 03/15/17 0345  WBC 7.2 6.5  HGB 6.1* 6.7*  HCT 20.8* 23.0*  MCV 76.8* 77.7*  PLT 353 353   No results for input(s): LABPROT, INR in the last 72 hours.    Assessment/Plan: - Symptomatic anemia with history of intermittent black stool as well as bright red blood per rectum. - Severe iron deficiency anemia. - History of NSAID use. - History of prostate cancer status post radiation  Recommendations -------------------------- - Continue current diet for now. Nothing by mouth past midnight - Possible EGD and flexible sigmoidoscopy tomorrow depending on anesthesia availability as well as depending on hemoglobin.  - GI will follow.   Otis Brace MD, FACP 03/15/2017, 9:11 AM  Pager [phone]  If no answer or after 5 PM call [phone]

## 2017-03-15 NOTE — Progress Notes (Addendum)
Patient ID: Scott Newman, male   DOB: 10-14-1945, 71 y.o.   MRN: [id number]  PROGRESS NOTE    Lani Mendiola Coonradt  YIR:[id number] DOB: 02-27-1946 DOA: 03/10/2017  PCP: Jani Gravel, MD   Brief Narrative:   71 year old male with past medical history significant for hypertension, hypothyroidism, prostate cancer and chronic diastolic CHF who presented to ED with worsening shortness of breath and weakness as well as melena for 3 weeks. Pt usually take aspirin but stopped when melena started. Patient was hemodynamic stable on the admission. Hemoglobin was 5.9. Fecal occult blood test was positive. 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 Feraheme and Protonix infusion started. GI and Hematology consulted.  Assessment & Plan:   Principal Problem: Acute GI bleed with melena / acute blood loss anemia / Iron deficiency anemia  - Because patient is Jehovah's Witness he is not accepting blood transfusion - Has received 1 dose of Feraheme 8/30; per hematology, will give 1-2 more dose every week - Also has gotten aranesp - Continue Protonix - Hgb 6.7 this am - GI may do EGD or flex if hgb above 7 prior to hospital discharge   Active Problems: Acute on chronic diastolic CHF - ECHO on this admission with normal EF, grade 2 DD - Stable resp status   Essential hypertension - Continue Lisinopril   Hypothyroidism - Continue synthroid   DVT prophylaxis: SCD's Code Status: full code  Family Communication: wife at [place] Disposition Plan: may have EGD per GI once hgb above 7   Consultants:  GI  Hematology   Procedures:   ECHO 8/31 - normal EF, grade 2 DD   Antimicrobials:   None    Subjective: No overnight events.  Objective: Vitals:   03/14/17 1246 03/14/17 2232 03/15/17 0513 03/15/17 0720  BP: (!) 120/53 (!) 123/54 (!) 129/51 (!) 126/50  Pulse: 73 71 73 71  Resp: 18 16 16 14   Temp: 97.7 F (36.5 C) 98.7 F (37.1 C) 97.6 F (36.4 C) 98.1 F  (36.7 C)  TempSrc: Oral Oral Oral Oral  SpO2: 98% 95% 97% 97%  Weight:      Height:        Intake/Output Summary (Last 24 hours) at 03/15/17 0826 Last data filed at 03/15/17 0600  Gross per 24 hour  Intake              240 ml  Output              900 ml  Net             -660 ml   Filed Weights   03/12/17 0552 03/13/17 0425 03/14/17 0539  Weight: 83.9 kg (185 lb) 79.1 kg (174 lb 6.1 oz) 80.2 kg (176 lb 12.9 oz)    Physical Exam  Constitutional: Appears well-developed and well-nourished. No distress.  CVS: RRR, S1/S2 +, no murmurs, no gallops, no carotid bruit.  Pulmonary: Effort and breath sounds normal, no stridor, rhonchi, wheezes, rales.  Abdominal: Soft. BS +,  no distension, tenderness, rebound or guarding.  Musculoskeletal: Normal range of motion. No edema and no tenderness.  Lymphadenopathy: No lymphadenopathy noted, cervical, inguinal. Neuro: Alert. Normal reflexes, muscle tone coordination. No cranial nerve deficit. Skin: Skin is warm and dry. No rash noted. Not diaphoretic. No erythema. No pallor.  Psychiatric: Normal mood and affect. Behavior, judgment, thought content normal.    Data Reviewed: I have personally reviewed following labs and imaging studies  CBC:  Recent Labs  Lab 03/10/17 1846 03/11/17 0705 03/12/17 0509 03/13/17 0407 03/14/17 0547 03/15/17 0345  WBC 4.2 5.2 5.0 7.2 7.2 6.5  NEUTROABS 3.3  --   --   --   --   --   HGB 5.9* 5.0* 5.2* 5.5* 6.1* 6.7*  HCT 20.0* 16.7* 17.6* 19.2* 20.8* 23.0*  MCV 72.2* 70.8* 72.7* 75.3* 76.8* 77.7*  PLT 329 288 311 331 353 315   Basic Metabolic Panel:  Recent Labs Lab 03/10/17 1846 03/11/17 0705 03/12/17 0509 03/13/17 0407  NA 130* 132* 134* 134*  K 4.2 3.8 3.9 4.0  CL 101 103 106 105  CO2 18* 21* 22 20*  GLUCOSE 100* 89 86 77  BUN 19 17 11 12   CREATININE 1.01 1.11 1.12 1.10  CALCIUM 9.1 8.9 8.9 9.1   GFR: Estimated Creatinine Clearance: 60.7 mL/min (by C-G formula based on SCr of 1.1  mg/dL). Liver Function Tests:  Recent Labs Lab 03/10/17 1846  AST 23  ALT 18  ALKPHOS 55  BILITOT 0.8  PROT 7.9  ALBUMIN 4.7   No results for input(s): LIPASE, AMYLASE in the last 168 hours. No results for input(s): AMMONIA in the last 168 hours. Coagulation Profile:  Recent Labs Lab 03/10/17 1846  INR 1.10   Cardiac Enzymes:  Recent Labs Lab 03/10/17 1846  TROPONINI <0.03   BNP (last 3 results) No results for input(s): PROBNP in the last 8760 hours. HbA1C: No results for input(s): HGBA1C in the last 72 hours. CBG: No results for input(s): GLUCAP in the last 168 hours. Lipid Profile: No results for input(s): CHOL, HDL, LDLCALC, TRIG, CHOLHDL, LDLDIRECT in the last 72 hours. Thyroid Function Tests: No results for input(s): TSH, T4TOTAL, FREET4, T3FREE, THYROIDAB in the last 72 hours. Anemia Panel: No results for input(s): VITAMINB12, FOLATE, FERRITIN, TIBC, IRON, RETICCTPCT in the last 72 hours. Urine analysis:    Component Value Date/Time   COLORURINE YELLOW 04/30/2011 1307   APPEARANCEUR CLEAR 04/30/2011 1307   LABSPEC 1.015 04/30/2011 1307   PHURINE 6.5 04/30/2011 1307   GLUCOSEU NEGATIVE 04/30/2011 1307   HGBUR NEGATIVE 04/30/2011 [place] 04/30/2011 [place] 04/30/2011 1307   PROTEINUR NEGATIVE 04/30/2011 1307   UROBILINOGEN 1.0 04/30/2011 1307   NITRITE NEGATIVE 04/30/2011 1307   LEUKOCYTESUR NEGATIVE 04/30/2011 1307   Sepsis Labs: @LABRCNTIP (procalcitonin:4,lacticidven:4)   )No results found for this or any previous visit (from the past 240 hour(s)).    Radiology Studies: No results found.     Scheduled Meds: . feeding supplement  1 Container Oral TID BM  . folic acid  1 mg Oral Daily  . levothyroxine  50 mcg Oral QAC breakfast  . lisinopril  20 mg Oral Daily  . pantoprazole (PROTONIX) IV  40 mg Intravenous Q12H  . potassium chloride  20 mEq Oral Daily   Continuous Infusions:    LOS: 5 days     Time spent: 25 minutes  Greater than 50% of the time spent on counseling and coordinating the care.   Leisa Lenz, MD [facility] Pager [phone]  If 7PM-7AM, please contact night-coverage [email or web address] Password [email or web address] 03/15/2017, 8:26 AM

## 2017-03-16 ENCOUNTER — Encounter (HOSPITAL_COMMUNITY): Payer: Self-pay | Admitting: *Deleted

## 2017-03-16 ENCOUNTER — Inpatient Hospital Stay (HOSPITAL_COMMUNITY): Payer: Medicare Other | Admitting: Anesthesiology

## 2017-03-16 ENCOUNTER — Encounter (HOSPITAL_COMMUNITY)
Admission: EM | Disposition: A | Discharge status: Home Health | Payer: Self-pay | Source: Home / Self Care | Attending: Internal Medicine

## 2017-03-16 HISTORY — PX: FLEXIBLE SIGMOIDOSCOPY: SHX5431

## 2017-03-16 HISTORY — PX: ESOPHAGOGASTRODUODENOSCOPY (EGD) WITH PROPOFOL: SHX5813

## 2017-03-16 LAB — CBC
HCT: 22.8 % — ABNORMAL LOW (ref 39.0–52.0)
Hemoglobin: 6.7 g/dL — CL (ref 13.0–17.0)
MCH: 23.1 pg — ABNORMAL LOW (ref 26.0–34.0)
MCHC: 29.4 g/dL — ABNORMAL LOW (ref 30.0–36.0)
MCV: 78.6 fL (ref 78.0–100.0)
PLATELETS: 290 10*3/uL (ref 150–400)
RBC: 2.9 MIL/uL — ABNORMAL LOW (ref 4.22–5.81)
RDW: 25.5 % — AB (ref 11.5–15.5)
WBC: 6 10*3/uL (ref 4.0–10.5)

## 2017-03-16 SURGERY — ESOPHAGOGASTRODUODENOSCOPY (EGD) WITH PROPOFOL
Anesthesia: Monitor Anesthesia Care

## 2017-03-16 MED ORDER — ONDANSETRON HCL 4 MG/2ML IJ SOLN
INTRAMUSCULAR | Status: DC | PRN
  Administered 2017-03-16: 4 mg via INTRAVENOUS

## 2017-03-16 MED ORDER — LACTATED RINGERS IV SOLN
INTRAVENOUS | Status: DC
  Administered 2017-03-16: 11:00:00 via INTRAVENOUS

## 2017-03-16 MED ORDER — PROPOFOL 10 MG/ML IV BOLUS
INTRAVENOUS | Status: DC | PRN
  Administered 2017-03-16 (×2): 10 mg via INTRAVENOUS

## 2017-03-16 MED ORDER — PHENOL 1.4 % MT LIQD
1.0000 | OROMUCOSAL | Status: DC | PRN
  Filled 2017-03-16: qty 177

## 2017-03-16 MED ORDER — VITAMINS A & D EX OINT
TOPICAL_OINTMENT | CUTANEOUS | Status: AC
  Filled 2017-03-16: qty 5

## 2017-03-16 MED ORDER — SODIUM CHLORIDE 0.9 % IV SOLN
510.0000 mg | Freq: Once | INTRAVENOUS | Status: AC
  Administered 2017-03-16: 510 mg via INTRAVENOUS
  Filled 2017-03-16: qty 17

## 2017-03-16 MED ORDER — WITCH HAZEL-GLYCERIN EX PADS
MEDICATED_PAD | CUTANEOUS | Status: DC | PRN
  Filled 2017-03-16: qty 100

## 2017-03-16 MED ORDER — EPHEDRINE 5 MG/ML INJ
INTRAVENOUS | Status: AC
  Filled 2017-03-16: qty 10

## 2017-03-16 MED ORDER — LIDOCAINE 2% (20 MG/ML) 5 ML SYRINGE
INTRAMUSCULAR | Status: DC | PRN
  Administered 2017-03-16: 100 mg via INTRAVENOUS

## 2017-03-16 MED ORDER — LIDOCAINE 2% (20 MG/ML) 5 ML SYRINGE
INTRAMUSCULAR | Status: AC
  Filled 2017-03-16: qty 5

## 2017-03-16 MED ORDER — SODIUM CHLORIDE 0.9 % IV SOLN: INTRAVENOUS | Status: DC

## 2017-03-16 MED ORDER — EPHEDRINE SULFATE 50 MG/ML IJ SOLN
INTRAMUSCULAR | Status: DC | PRN
  Administered 2017-03-16 (×3): 10 mg via INTRAVENOUS

## 2017-03-16 MED ORDER — ONDANSETRON HCL 4 MG/2ML IJ SOLN
INTRAMUSCULAR | Status: AC
  Filled 2017-03-16: qty 2

## 2017-03-16 MED ORDER — PROPOFOL 10 MG/ML IV BOLUS
INTRAVENOUS | Status: AC
  Filled 2017-03-16: qty 20

## 2017-03-16 MED ORDER — FLEET ENEMA 7-19 GM/118ML RE ENEM
1.0000 | ENEMA | Freq: Once | RECTAL | Status: AC
  Administered 2017-03-16: 1 via RECTAL
  Filled 2017-03-16: qty 1

## 2017-03-16 MED ORDER — PROPOFOL 10 MG/ML IV BOLUS
INTRAVENOUS | Status: AC
  Filled 2017-03-16: qty 40

## 2017-03-16 MED ORDER — PROPOFOL 500 MG/50ML IV EMUL
INTRAVENOUS | Status: DC | PRN
  Administered 2017-03-16: 200 ug/kg/min via INTRAVENOUS

## 2017-03-16 MED ORDER — SODIUM CHLORIDE 0.9 % IV SOLN
INTRAVENOUS | Status: DC
  Administered 2017-03-17: 14:00:00 via INTRAVENOUS

## 2017-03-16 SURGICAL SUPPLY — 15 items

## 2017-03-16 NOTE — Progress Notes (Signed)
[facility] Gastroenterology Progress Note  Scott Newman 71 y.o. [birth year]  CC:  Symptomatic anemia   Subjective: No acute issues overnight. Denied further bleeding episodes. Overall feeling better.  ROS : Denied chest pain and shortness of breath. His weakness is improving.  Objective: Vital signs in last 24 hours: Vitals:   03/15/17 2107 03/16/17 0544  BP: (!) 107/50 (!) 129/51  Pulse: 69 68  Resp: 17 16  Temp: 98.3 F (36.8 C) 97.6 F (36.4 C)  SpO2: 100% 98%    Physical Exam:  General:  Alert, cooperative, no distress, appears stated age  Head:  Normocephalic, without obvious abnormality, atraumatic  Eyes:  , EOM's intact,   Lungs:   Occasional expiratory rhonchi   Heart:  Regular rate and rhythm, S1, S2 normal  Abdomen:   Soft, non-tender, bowel sounds active all four quadrants,  no masses, No peritoneal signs Nondistended   Extremities: Extremities normal, atraumatic, no  edema  Pulses: 2+ and symmetric    Lab Results: No results for input(s): NA, K, CL, CO2, GLUCOSE, BUN, CREATININE, CALCIUM, MG, PHOS in the last 72 hours. No results for input(s): AST, ALT, ALKPHOS, BILITOT, PROT, ALBUMIN in the last 72 hours.  Recent Labs  03/15/17 0345 03/16/17 0751  WBC 6.5 6.0  HGB 6.7* 6.7*  HCT 23.0* 22.8*  MCV 77.7* 78.6  PLT 353 290   No results for input(s): LABPROT, INR in the last 72 hours.    Assessment/Plan: - Symptomatic anemia with history of intermittent black stool as well as bright red blood per rectum. - Severe iron deficiency anemia. - History of NSAID use. - History of prostate cancer status post radiation  Recommendations -------------------------- - Repeat CBC today. - EGD and flexible sigmoidoscopy today for further evaluation. Risk benefits alternatives discussed with the patient and wife. They verbalized understanding. - Further plan based on endoscopy findings.   Otis Brace MD, [place] 03/16/2017, 8:34 AM  Pager [phone]  If  no answer or after 5 PM call [phone]

## 2017-03-16 NOTE — Transfer of Care (Signed)
Immediate Anesthesia Transfer of Care Note  Patient: Scott Newman  Procedure(s) Performed: Procedure(s): ESOPHAGOGASTRODUODENOSCOPY (EGD) WITH PROPOFOL (N/A) FLEXIBLE SIGMOIDOSCOPY (N/A)  Patient Location: PACU  Anesthesia Type:MAC  Level of Consciousness:  sedated, patient cooperative and responds to stimulation  Airway & Oxygen Therapy:Patient Spontanous Breathing and Patient connected to face mask oxgen  Post-op Assessment:  Report given to PACU RN and Post -op Vital signs reviewed and stable  Post vital signs:  Reviewed and stable  Last Vitals:  Vitals:   03/16/17 0544 03/16/17 1100  BP: (!) 129/51 (!) 159/72  Pulse: 68 86  Resp: 16 12  Temp: 36.4 C 36.6 C  SpO2: 16% 60%    Complications: No apparent anesthesia complications

## 2017-03-16 NOTE — Brief Op Note (Signed)
03/10/2017 - 03/16/2017  12:49 PM  PATIENT:  Scott Newman  71 y.o. male  PRE-OPERATIVE DIAGNOSIS:  anemia  POST-OPERATIVE DIAGNOSIS:  Gastric polyps, esophageal narrowing.  Sigmodiverticulosis, Retained stool. Rectal clot, radiation proctitis    PROCEDURE:  Procedure(s): ESOPHAGOGASTRODUODENOSCOPY (EGD) WITH PROPOFOL (N/A) FLEXIBLE SIGMOIDOSCOPY (N/A)  SURGEON:  Surgeon(s) and Role:    * Axel Meas, MD - Primary  Findings/recommendations -------------------------------------- - EGD showed no evidence of active bleeding. Small gastric polyp and biopsy was performed. - Flexible sigmoidoscopy showed active bleeding from possible radiation proctitis. That area was treated with APC. Hemostasis was achieved. - Full liquid diet today. Monitor hemoglobin. - Recommend repeat flexible sigmoidoscopy in 3-4 weeks by Dr. Watt Climes  for possible RFA - Possible discharge tomorrow. - GI will follow.     Otis Brace MD, [place] 03/16/2017, 12:50 PM  Pager [phone]  If no answer or after 5 PM call [phone]

## 2017-03-16 NOTE — Anesthesia Preprocedure Evaluation (Addendum)
Anesthesia Evaluation  Patient identified by MRN, date of birth, ID band Patient awake    Reviewed: Allergy & Precautions, NPO status , Patient's Chart, lab work & pertinent test results  Airway Mallampati: II  TM Distance: >3 FB Neck ROM: Full    Dental  (+) Missing,    Pulmonary former smoker,    Pulmonary exam normal breath sounds clear to auscultation       Cardiovascular hypertension, Pt. on medications + Peripheral Vascular Disease and +CHF  Normal cardiovascular exam+ Valvular Problems/Murmurs (mild AS) AS  Rhythm:Regular Rate:Normal + Systolic murmurs ECG: SR, rate 83  ECHO:  Left ventricle: The cavity size was normal. Wall thickness was increased in a pattern of mild LVH. Systolic function was vigorous. The estimated ejection fraction was in the range of 65% to 70%. Features are consistent with a pseudonormal left ventricular filling pattern, with concomitant abnormal relaxation and increased filling pressure (grade 2 diastolic dysfunction). Aortic valve: AV is thickened, calcified with mildly restricted motion. Mean gradient through the valve is 19 mm Hg consistent with mild AS There was mild to moderate regurgitation. Mitral valve: There was mild regurgitation. Left atrium: The atrium was mildly dilated.    Neuro/Psych PSYCHIATRIC DISORDERS Depression negative neurological ROS     GI/Hepatic negative GI ROS, ETOH abuse   Endo/Other  Hypothyroidism   Renal/GU negative Renal ROS     Musculoskeletal  (+) Arthritis ,   Abdominal   Peds  Hematology  (+) anemia ,   Anesthesia Other Findings   Reproductive/Obstetrics                            Anesthesia Physical  Anesthesia Plan  ASA: III  Anesthesia Plan: MAC   Post-op Pain Management:    Induction: Intravenous  PONV Risk Score and Plan: 1 and Propofol infusion and Treatment may vary due to age or medical  condition  Airway Management Planned: Natural Airway  Additional Equipment:   Intra-op Plan:   Post-operative Plan:   Informed Consent: I have reviewed the patients History and Physical, chart, labs and discussed the procedure including the risks, benefits and alternatives for the proposed anesthesia with the patient or authorized representative who has indicated his/her understanding and acceptance.   Dental advisory given  Plan Discussed with: CRNA  Anesthesia Plan Comments:         Anesthesia Quick Evaluation

## 2017-03-16 NOTE — Op Note (Signed)
[facility] Patient Name: Scott Newman Procedure Date: 03/16/2017 MRN: [id number] Attending MD: Otis Brace , MD Date of Birth: 1945/07/26 CSN: [id number] Age: 71 Admit Type: Inpatient Procedure:                Flexible Sigmoidoscopy Indications:              Rectal hemorrhage Providers:                Otis Brace, MD, Laverta Baltimore RN, RN,                            Danford Bad, Technician Referring MD:              Medicines:                Sedation Administered by an Anesthesia Professional Complications:            No immediate complications. Estimated Blood Loss:     Estimated blood loss was minimal. Procedure:                Pre-Anesthesia Assessment:                           - Prior to the procedure, a History and Physical                            was performed, and patient medications and                            allergies were reviewed. The patient's tolerance of                            previous anesthesia was also reviewed. The risks                            and benefits of the procedure and the sedation                            options and risks were discussed with the patient.                            All questions were answered, and informed consent                            was obtained. Prior Anticoagulants: The patient has                            taken no previous anticoagulant or antiplatelet                            agents. ASA Grade Assessment: III - A patient with                            severe systemic disease. After reviewing the risks  and benefits, the patient was deemed in                            satisfactory condition to undergo the procedure.                           After obtaining informed consent, the scope was                            passed under direct vision. The [id number] ([id number])                            scope was introduced through the anus and advanced                             to the 40 cm from the anal verge. The flexible                            sigmoidoscopy was technically difficult and complex                            due to poor bowel prep with stool present. The                            quality of the bowel preparation was poor. Scope In: 11:47:36 AM Scope Out: 12:09:41 PM Total Procedure Duration: 0 hours 22 minutes 5 seconds  Findings:      The perianal exam findings include hypertrophied anal papilla(e).      There is evidence of active bleeding and blood clot in the rectum from       possible radiation proctitis. Blood clots were removed. Active oozing       was noted. This area was subsequently treated with straight and       circumferential probe APC. Successful hemostasis was achieved.      Multiple small and large-mouthed diverticula were found in the sigmoid       colon. Impression:               - Preparation of the colon was poor.                           - Hypertrophied anal papilla(e) found on perianal                            exam.                           - No specimens collected. Moderate Sedation:      Moderate (conscious) sedation was personally administered by an       anesthesia professional. The following parameters were monitored: oxygen       saturation, heart rate, blood pressure, and response to care. Recommendation:           - Return patient to hospital ward for ongoing care. Procedure Code(s):        --- Professional ---  73428, Sigmoidoscopy, flexible; diagnostic,                            including collection of specimen(s) by brushing or                            washing, when performed (separate procedure) Diagnosis Code(s):        --- Professional ---                           K62.89, Other specified diseases of anus and rectum                           K62.5, Hemorrhage of anus and rectum CPT copyright 2016 [employer]. All rights  reserved. The codes documented in this report are preliminary and upon coder review may  be revised to meet current compliance requirements. Otis Brace, MD Otis Brace, MD 03/16/2017 12:31:18 PM Number of Addenda: 0

## 2017-03-16 NOTE — Progress Notes (Signed)
PROGRESS NOTE    Scott Newman  MKL:[id number] DOB: Feb 12, 1946 DOA: 03/10/2017 PCP: Jani Gravel, MD   Brief Narrative: 71 year old male admitted with intermittent black stools as well as bright red bleeding per rectum. Patient has not had any more episodes of black stools or bleeding per rectum over the last 48 hours. Patient is being prepped for flexible sigmoidoscopy and EGD today. last hemoglobin I have is 6.7. Patient is Jehovah witness he does not take any IV blood products. However he received IV heme and is being treated with IV Protonix. Patient is awake alert resting in bed. Denied any chest pain shortness of breath. Patient's wife is by the bedside.    Assessment & Plan:   Principal Problem:   GI bleeding Active Problems:   Essential hypertension   Chronic diastolic heart failure (HCC)   Hypothyroidism, acquired   Anemia, iron deficiency   Symptomatic iron deficiency anemia secondary to GI bleed rule out upper GI or lower GI causes. For EGD and flexible sigmoidoscopy today appreciate GI input. Continue Protonix.  History of hypertension but his blood pressure has been low. Will DC his lisinopril. And will give him slow IV hydration with normal saline.   Disposition patient has generalized deconditioning. Will obtain PT OT eval. And wife request home health upon discharge. However patient would like to go home upon discharge.  Hypothyroidism continue Synthroid.   DVT prophylaxis: scds Code Status full Family Communication: Discussed with wife at the bedside.   Consultants: gi  Procedures:   Antimicrobials:    Subjective: Feels better  Objective: Vitals:   03/15/17 1443 03/15/17 2107 03/16/17 0544 03/16/17 1100  BP: (!) 109/54 (!) 107/50 (!) 129/51 (!) 159/72  Pulse: 63 69 68 86  Resp: 16 17 16 12   Temp: 98 F (36.7 C) 98.3 F (36.8 C) 97.6 F (36.4 C) 97.8 F (36.6 C)  TempSrc: Oral Oral Oral Oral  SpO2: 94% 100% 98% 99%  Weight:      Height:         Intake/Output Summary (Last 24 hours) at 03/16/17 1204 Last data filed at 03/16/17 1151  Gross per 24 hour  Intake              700 ml  Output              900 ml  Net             -200 ml   Filed Weights   03/12/17 0552 03/13/17 0425 03/14/17 0539  Weight: 83.9 kg (185 lb) 79.1 kg (174 lb 6.1 oz) 80.2 kg (176 lb 12.9 oz)    Examination:  General exam: Appears calm and comfortable  Respiratory system: Clear to auscultation. Respiratory effort normal. Cardiovascular system: S1 & S2 heard, RRR. No JVD, murmurs, rubs, gallops or clicks. No pedal edema. Gastrointestinal system: Abdomen is nondistended, soft and nontender. No organomegaly or masses felt. Normal bowel sounds heard. Central nervous system: Alert and oriented. No focal neurological deficits. Extremities: Symmetric 5 x 5 power. Skin: No rashes, lesions or ulcers Psychiatry: Judgement and insight appear normal. Mood & affect appropriate.     Data Reviewed: I have personally reviewed following labs and imaging studies  CBC:  Recent Labs Lab 03/10/17 1846  03/12/17 0509 03/13/17 0407 03/14/17 0547 03/15/17 0345 03/16/17 0751  WBC 4.2  < > 5.0 7.2 7.2 6.5 6.0  NEUTROABS 3.3  --   --   --   --   --   --  HGB 5.9*  < > 5.2* 5.5* 6.1* 6.7* 6.7*  HCT 20.0*  < > 17.6* 19.2* 20.8* 23.0* 22.8*  MCV 72.2*  < > 72.7* 75.3* 76.8* 77.7* 78.6  PLT 329  < > 311 331 353 353 290  < > = values in this interval not displayed. Basic Metabolic Panel:  Recent Labs Lab 03/10/17 1846 03/11/17 0705 03/12/17 0509 03/13/17 0407  NA 130* 132* 134* 134*  K 4.2 3.8 3.9 4.0  CL 101 103 106 105  CO2 18* 21* 22 20*  GLUCOSE 100* 89 86 77  BUN 19 17 11 12   CREATININE 1.01 1.11 1.12 1.10  CALCIUM 9.1 8.9 8.9 9.1   GFR: Estimated Creatinine Clearance: 60.7 mL/min (by C-G formula based on SCr of 1.1 mg/dL). Liver Function Tests:  Recent Labs Lab 03/10/17 1846  AST 23  ALT 18  ALKPHOS 55  BILITOT 0.8  PROT 7.9    ALBUMIN 4.7   No results for input(s): LIPASE, AMYLASE in the last 168 hours. No results for input(s): AMMONIA in the last 168 hours. Coagulation Profile:  Recent Labs Lab 03/10/17 1846  INR 1.10   Cardiac Enzymes:  Recent Labs Lab 03/10/17 1846  TROPONINI <0.03   BNP (last 3 results) No results for input(s): PROBNP in the last 8760 hours. HbA1C: No results for input(s): HGBA1C in the last 72 hours. CBG: No results for input(s): GLUCAP in the last 168 hours. Lipid Profile: No results for input(s): CHOL, HDL, LDLCALC, TRIG, CHOLHDL, LDLDIRECT in the last 72 hours. Thyroid Function Tests: No results for input(s): TSH, T4TOTAL, FREET4, T3FREE, THYROIDAB in the last 72 hours. Anemia Panel: No results for input(s): VITAMINB12, FOLATE, FERRITIN, TIBC, IRON, RETICCTPCT in the last 72 hours. Sepsis Labs: No results for input(s): PROCALCITON, LATICACIDVEN in the last 168 hours.  No results found for this or any previous visit (from the past 240 hour(s)).       Radiology Studies: No results found.      Scheduled Meds: . [MAR Hold] feeding supplement  1 Container Oral TID BM  . [MAR Hold] folic acid  1 mg Oral Daily  . [MAR Hold] levothyroxine  50 mcg Oral QAC breakfast  . [MAR Hold] pantoprazole (PROTONIX) IV  40 mg Intravenous Q12H  . [MAR Hold] potassium chloride  20 mEq Oral Daily  . [MAR Hold] sodium phosphate  1 enema Rectal Once   Continuous Infusions: . sodium chloride    . lactated ringers 20 mL/hr at 03/16/17 1101     LOS: 6 days    Time spent:     Georgette Shell, MD [facility]  If 7PM-7AM, please contact night-coverage [email or web address] Password [email or web address] 03/16/2017, 12:04 PM

## 2017-03-16 NOTE — Op Note (Signed)
[facility] Patient Name: Scott Newman Procedure Date: 03/16/2017 MRN: [id number] Attending MD: Otis Brace , MD Date of Birth: Mar 09, 1946 CSN: [id number] Age: 71 Admit Type: Inpatient Procedure:                Upper GI endoscopy Indications:              Iron deficiency anemia, Melena Providers:                Otis Brace, MD, Laverta Baltimore RN, RN,                            Danford Bad, Technician Referring MD:              Medicines:                Sedation Administered by an Anesthesia Professional Complications:            No immediate complications. Estimated Blood Loss:     Estimated blood loss was minimal. Procedure:                Pre-Anesthesia Assessment:                           - Prior to the procedure, a History and Physical                            was performed, and patient medications and                            allergies were reviewed. The patient's tolerance of                            previous anesthesia was also reviewed. The risks                            and benefits of the procedure and the sedation                            options and risks were discussed with the patient.                            All questions were answered, and informed consent                            was obtained. Prior Anticoagulants: The patient has                            taken no previous anticoagulant or antiplatelet                            agents. ASA Grade Assessment: III - A patient with                            severe systemic disease. After reviewing the risks  and benefits, the patient was deemed in                            satisfactory condition to undergo the procedure.                           After obtaining informed consent, the endoscope was                            passed under direct vision. Throughout the                            procedure, the patient's blood pressure, pulse, and                             oxygen saturations were monitored continuously. The                            [id number] scope was introduced through the                            mouth, and advanced to the second part of duodenum.                            The [id number]) scope was introduced through                            the and advanced to the. The upper GI endoscopy was                            technically difficult and complex due to                            challenging esophageal intubation because of poor                            visualization. Successful completion of the                            procedure was aided by withdrawing the scope and                            replacing with the pediatric endoscope. The patient                            tolerated the procedure well. Scope In: Scope Out: Findings:      The Z-line was regular and was found 38 cm from the incisors. There was       difficulty in intubation with the adult endoscope and subsequently       pediatric endoscope was used. Did not find any stricture at this area       and difficult intubation could be just from spasm.      The exam of the esophagus was otherwise normal.  Normal mucosa was found in the entire examined stomach. Biopsies were       taken with a cold forceps for Helicobacter pylori testing.      Two diminutive sessile polyps were found in the gastric antrum. Biopsies       were taken with a cold forceps for histology.      The duodenal bulb, first portion of the duodenum and second portion of       the duodenum were normal. Biopsies were taken with a cold forceps for       histology. Impression:               - Z-line regular, 38 cm from the incisors.                           - Normal mucosa was found in the entire stomach.                            Biopsied.                           - Two gastric polyps. Biopsied.                           - Normal duodenal bulb,  first portion of the                            duodenum and second portion of the duodenum.                            Biopsied. Moderate Sedation:      Moderate (conscious) sedation was personally administered by an       anesthesia professional. The following parameters were monitored: oxygen       saturation, heart rate, blood pressure, and response to care. Recommendation:           - Return patient to hospital ward for ongoing care.                           - Full liquid diet.                           - Continue present medications.                           - Await pathology results. Procedure Code(s):        --- Professional ---                           [phone], Esophagogastroduodenoscopy, flexible,                            transoral; with biopsy, single or multiple Diagnosis Code(s):        --- Professional ---                           K31.7, Polyp of stomach and duodenum  D50.9, Iron deficiency anemia, unspecified                           K92.1, Melena (includes Hematochezia) CPT copyright 2016 American [employer] Association. All rights reserved. The codes documented in this report are preliminary and upon coder review may  be revised to meet current compliance requirements. Otis Brace, MD Otis Brace, MD 03/16/2017 12:24:11 PM Number of Addenda: 0

## 2017-03-16 NOTE — Anesthesia Postprocedure Evaluation (Signed)
Anesthesia Post Note  Patient: Scott Newman  Procedure(s) Performed: Procedure(s) (LRB): ESOPHAGOGASTRODUODENOSCOPY (EGD) WITH PROPOFOL (N/A) FLEXIBLE SIGMOIDOSCOPY (N/A)     Patient location during evaluation: PACU Anesthesia Type: MAC Level of consciousness: awake and alert Pain management: pain level controlled Vital Signs Assessment: post-procedure vital signs reviewed and stable Respiratory status: spontaneous breathing, nonlabored ventilation, respiratory function stable and patient connected to nasal cannula oxygen Cardiovascular status: stable and blood pressure returned to baseline Anesthetic complications: no    Last Vitals:  Vitals:   03/16/17 1240 03/16/17 1250  BP: (!) 110/44 (!) 101/28  Pulse: 70 70  Resp: 18 17  Temp:    SpO2: 99% 100%    Last Pain:  Vitals:   03/16/17 1216  TempSrc: Oral  PainSc:                  Ryan P Ellender

## 2017-03-17 ENCOUNTER — Encounter (HOSPITAL_COMMUNITY): Payer: Self-pay | Admitting: Gastroenterology

## 2017-03-17 LAB — CBC WITH DIFFERENTIAL/PLATELET
Basophils Absolute: 0 10*3/uL (ref 0.0–0.1)
Basophils Relative: 0 %
EOS ABS: 0.1 10*3/uL (ref 0.0–0.7)
Eosinophils Relative: 1 %
HCT: 21.6 % — ABNORMAL LOW (ref 39.0–52.0)
Hemoglobin: 6.5 g/dL — CL (ref 13.0–17.0)
Lymphocytes Relative: 5 %
Lymphs Abs: 0.7 10*3/uL (ref 0.7–4.0)
MCH: 24 pg — AB (ref 26.0–34.0)
MCHC: 30.1 g/dL (ref 30.0–36.0)
MCV: 79.7 fL (ref 78.0–100.0)
MONO ABS: 0.5 10*3/uL (ref 0.1–1.0)
Monocytes Relative: 4 %
NEUTROS PCT: 90 %
Neutro Abs: 11.7 10*3/uL — ABNORMAL HIGH (ref 1.7–7.7)
PLATELETS: 268 10*3/uL (ref 150–400)
RBC: 2.71 MIL/uL — AB (ref 4.22–5.81)
RDW: 27.6 % — ABNORMAL HIGH (ref 11.5–15.5)
WBC: 13 10*3/uL — AB (ref 4.0–10.5)

## 2017-03-17 LAB — BASIC METABOLIC PANEL
ANION GAP: 9 (ref 5–15)
BUN: 9 mg/dL (ref 6–20)
CALCIUM: 8.7 mg/dL — AB (ref 8.9–10.3)
CO2: 20 mmol/L — ABNORMAL LOW (ref 22–32)
Chloride: 103 mmol/L (ref 101–111)
Creatinine, Ser: 1.05 mg/dL (ref 0.61–1.24)
GFR calc Af Amer: >60 mL/min (ref >60)
Glucose, Bld: 132 mg/dL — ABNORMAL HIGH (ref 65–99)
Potassium: 3.8 mmol/L (ref 3.5–5.1)
SODIUM: 132 mmol/L — AB (ref 135–145)

## 2017-03-17 MED ORDER — POLYETHYLENE GLYCOL 3350 17 G PO PACK
17.0000 g | PACK | Freq: Every day | ORAL | Status: DC
  Administered 2017-03-17 – 2017-03-18 (×2): 17 g via ORAL
  Filled 2017-03-17 (×2): qty 1

## 2017-03-17 MED ORDER — PANTOPRAZOLE SODIUM 40 MG PO TBEC: 40.0000 mg | DELAYED_RELEASE_TABLET | Freq: Every day | ORAL | Status: DC

## 2017-03-17 NOTE — Progress Notes (Signed)
[facility] Progress Note  Scott Newman 71 y.o. [birth year]  CC:  Symptomatic anemia   Subjective:  Patient denied any abdominal pain, nausea or vomiting. No bowel movement since procedure .Tolerating full liquid diet.Denied any rectal pain  ROS : Denied chest pain and shortness of breath. His weakness is improving.  Objective: Vital signs in last 24 hours: Vitals:   03/17/17 0019 03/17/17 0638  BP: 138/60 (!) 100/47  Pulse: (!) 101 93  Resp: 16 16  Temp: 98.3 F (36.8 C) 99.3 F (37.4 C)  SpO2: 100% 91%    Physical Exam:  General:  Alert, cooperative, no distress, appears stated age  Head:  Normocephalic, without obvious abnormality, atraumatic  Eyes:  , EOM's intact,   Lungs:   Occasional expiratory rhonchi   Heart:  Regular rate and rhythm, S1, S2 normal  Abdomen:   Soft, non-tender, bowel sounds active all four quadrants,  no masses, No peritoneal signs Nondistended   Extremities: Extremities normal, atraumatic, no  edema  Pulses: 2+ and symmetric    Lab Results:  Recent Labs  03/17/17 0355  NA 132*  K 3.8  CL 103  CO2 20*  GLUCOSE 132*  BUN 9  CREATININE 1.05  CALCIUM 8.7*   No results for input(s): AST, ALT, ALKPHOS, BILITOT, PROT, ALBUMIN in the last 72 hours.  Recent Labs  03/16/17 0751 03/17/17 0355  WBC 6.0 13.0*  NEUTROABS  --  11.7*  HGB 6.7* 6.5*  HCT 22.8* 21.6*  MCV 78.6 79.7  PLT 290 268   No results for input(s): LABPROT, INR in the last 72 hours.    Assessment/Plan: - Symptomatic anemia with history of intermittent black stool as well as bright red blood per rectum. - Severe iron deficiency anemia. - History of NSAID use. - History of prostate cancer status post radiation - Patient is Jehovah's Witness  Recommendations -------------------------- - Flexible sigmoidoscopy yesterday Showed active bleeding from possible radiation proctitis. It was treated with APC yesterday.  EGD with pediatric upper endoscope was  negative for  Active bleeding And showed small diminutive polyp - Advance diet to soft diet. - Add MiraLAX as patient has no bowel movement since - Monitor H&H. Possible discharge tomorrow - He will need repeat flexible sig with possible RFA by Dr. Watt Climes  In 3-4 weeks.  Otis Brace MD, [place] 03/17/2017, 11:37 AM  Pager [phone]  If no answer or after 5 PM call [phone]

## 2017-03-17 NOTE — Progress Notes (Signed)
PROGRESS NOTE    Scott Newman  HEN:[id number] DOB: 08-11-1945 DOA: 03/10/2017 PCP: Jani Gravel, MD   Brief Narrative: 71 yo jehovah witness patient admitted with symptomatic anemia,gi bleed secondary to radiation proctitis.receiving feraheme today.worked with pt.egd and flex sig done yesterday.   Assessment & Plan:   Principal Problem:   GI bleeding Active Problems:   Essential hypertension   Chronic diastolic heart failure (HCC)   Hypothyroidism, acquired   Anemia, iron deficiency   Radiation proctitis with polyps biposy taken.continue protonix,feraheme.follow up levels.  Leukocytosis watch closely.no signs of infection at this time.  Chronic hyponatremia stable.     DVT prophylaxis:  Code Status:  Family Communication:  Disposition Plan:   Consultants:  Procedures: egd and flex sig  Antimicrobials:  none  Subjective: Feels tired  Objective: Vitals:   03/16/17 2041 03/17/17 0019 03/17/17 0638 03/17/17 1312  BP: (!) 119/56 138/60 (!) 100/47 (!) 113/51  Pulse: 69 (!) 101 93   Resp: 18 16 16 18   Temp: 98 F (36.7 C) 98.3 F (36.8 C) 99.3 F (37.4 C) 98.7 F (37.1 C)  TempSrc: Oral Oral Oral Oral  SpO2: 97% 100% 91% 91%  Weight:      Height:        Intake/Output Summary (Last 24 hours) at 03/17/17 1513 Last data filed at 03/17/17 1354  Gross per 24 hour  Intake          1295.25 ml  Output             1225 ml  Net            70.25 ml   Filed Weights   03/12/17 0552 03/13/17 0425 03/14/17 0539  Weight: 83.9 kg (185 lb) 79.1 kg (174 lb 6.1 oz) 80.2 kg (176 lb 12.9 oz)    Examination:  General exam: Appears calm and comfortable  Respiratory system: Clear to auscultation. Respiratory effort normal. Cardiovascular system: S1 & S2 heard, RRR. No JVD, murmurs, rubs, gallops or clicks. No pedal edema. Gastrointestinal system: Abdomen is nondistended, soft and nontender. No organomegaly or masses felt. Normal bowel sounds heard. Central nervous  system: Alert and oriented. No focal neurological deficits. Extremities: Symmetric 5 x 5 power. Skin: No rashes, lesions or ulcers Psychiatry: Judgement and insight appear normal. Mood & affect appropriate.     Data Reviewed: I have personally reviewed following labs and imaging studies  CBC:  Recent Labs Lab 03/10/17 1846  03/13/17 0407 03/14/17 0547 03/15/17 0345 03/16/17 0751 03/17/17 0355  WBC 4.2  < > 7.2 7.2 6.5 6.0 13.0*  NEUTROABS 3.3  --   --   --   --   --  11.7*  HGB 5.9*  < > 5.5* 6.1* 6.7* 6.7* 6.5*  HCT 20.0*  < > 19.2* 20.8* 23.0* 22.8* 21.6*  MCV 72.2*  < > 75.3* 76.8* 77.7* 78.6 79.7  PLT 329  < > 331 353 353 290 268  < > = values in this interval not displayed. Basic Metabolic Panel:  Recent Labs Lab 03/10/17 1846 03/11/17 0705 03/12/17 0509 03/13/17 0407 03/17/17 0355  NA 130* 132* 134* 134* 132*  K 4.2 3.8 3.9 4.0 3.8  CL 101 103 106 105 103  CO2 18* 21* 22 20* 20*  GLUCOSE 100* 89 86 77 132*  BUN 19 17 11 12 9   CREATININE 1.01 1.11 1.12 1.10 1.05  CALCIUM 9.1 8.9 8.9 9.1 8.7*   GFR: Estimated Creatinine Clearance: 63.6 mL/min (by C-G formula based  on SCr of 1.05 mg/dL). Liver Function Tests:  Recent Labs Lab 03/10/17 1846  AST 23  ALT 18  ALKPHOS 55  BILITOT 0.8  PROT 7.9  ALBUMIN 4.7   No results for input(s): LIPASE, AMYLASE in the last 168 hours. No results for input(s): AMMONIA in the last 168 hours. Coagulation Profile:  Recent Labs Lab 03/10/17 1846  INR 1.10   Cardiac Enzymes:  Recent Labs Lab 03/10/17 1846  TROPONINI <0.03   BNP (last 3 results) No results for input(s): PROBNP in the last 8760 hours. HbA1C: No results for input(s): HGBA1C in the last 72 hours. CBG: No results for input(s): GLUCAP in the last 168 hours. Lipid Profile: No results for input(s): CHOL, HDL, LDLCALC, TRIG, CHOLHDL, LDLDIRECT in the last 72 hours. Thyroid Function Tests: No results for input(s): TSH, T4TOTAL, FREET4, T3FREE,  THYROIDAB in the last 72 hours. Anemia Panel: No results for input(s): VITAMINB12, FOLATE, FERRITIN, TIBC, IRON, RETICCTPCT in the last 72 hours. Sepsis Labs: No results for input(s): PROCALCITON, LATICACIDVEN in the last 168 hours.  No results found for this or any previous visit (from the past 240 hour(s)).       Radiology Studies: No results found.      Scheduled Meds: . feeding supplement  1 Container Oral TID BM  . folic acid  1 mg Oral Daily  . levothyroxine  50 mcg Oral QAC breakfast  . pantoprazole  40 mg Oral Daily  . polyethylene glycol  17 g Oral Daily  . potassium chloride  20 mEq Oral Daily   Continuous Infusions: . lactated ringers Stopped (03/16/17 1256)     LOS: 7 days    Time spent:     Georgette Shell, MD [facility]   If 7PM-7AM, please contact night-coverage [email or web address] Password [email or web address] 03/17/2017, 3:13 PM

## 2017-03-17 NOTE — Evaluation (Signed)
Occupational Therapy Evaluation Patient Details Name: Scott Newman MRN: [id number] DOB: 02-28-1946 Today's Date: 03/17/2017    History of Present Illness 71 year old male with past medical history significant for hypertension, hypothyroidism, prostate cancer and chronic diastolic CHF and admitted for acute GI bleed.  Pt had EGD and flexible sigmoidoscopy on 03/16/17 revealing Gastric polyps, esophageal narrowing, Sigmodiverticulosis, Retained stool, Rectal clot, radiation proctitis     Clinical Impression   Pt was admitted for the above.  Pt presents with decreased endurance.  Hgb at time of eval is 6.5 and pt will not get blood transfusions.  He will benefit from continued OT to increase independence with adls. Goals are for supervision level in acute setting. He currently needs min A    Follow Up Recommendations  Supervision/Assistance - 24 hour    Equipment Recommendations  None recommended by OT (pt doesn't want 3:1)    Recommendations for Other Services       Precautions / Restrictions Precautions Precautions: Fall      Mobility Bed Mobility Overal bed mobility: Needs Assistance Bed Mobility: Supine to Sit     Supine to sit: Supervision;HOB elevated Sit to supine: Supervision;HOB elevated      Transfers Overall transfer level: Needs assistance Equipment used: Rolling walker (2 wheeled) Transfers: Sit to/from Stand Sit to Stand: Min guard         General transfer comment: increased time and effort, verbal cues for hand placement    Balance Overall balance assessment: Needs assistance         Standing balance support: Bilateral upper extremity supported Standing balance-Leahy Scale: Poor Standing balance comment: requiring UE support                           ADL either performed or assessed with clinical judgement   ADL Overall ADL's : Needs assistance/impaired Eating/Feeding: Independent   Grooming: Set up;Sitting   Upper Body  Bathing: Set up;Sitting   Lower Body Bathing: Minimal assistance;Sit to/from stand   Upper Body Dressing : Set up;Sitting   Lower Body Dressing: Minimal assistance;Sit to/from stand                 General ADL Comments: sidestepped up [facility] with min A, steadying assistance.  Pt reports that he sometimes has trouble with LE control sometimes putting feet into pants     Vision         Perception     Praxis      Pertinent Vitals/Pain Pain Assessment: No/denies pain     Hand Dominance     Extremity/Trunk Assessment Upper Extremity Assessment Upper Extremity Assessment: Generalized weakness          Communication Communication Communication: No difficulties   Cognition Arousal/Alertness: Awake/alert Behavior During Therapy: WFL for tasks assessed/performed Overall Cognitive Status: Within Functional Limits for tasks assessed                                     General Comments       Exercises     Shoulder Instructions      Home Living Family/patient expects to be discharged to:: Private residence Living Arrangements: Spouse/significant other Available Help at Discharge: Family Type of Home: House Home Access: Stairs to enter Technical brewer of Steps: 3 Entrance Stairs-Rails: None Home Layout: Able to live on main level with bedroom/bathroom     Bathroom  Shower/Tub: Occupational psychologist: Handicapped height     Home Equipment: Walker - standard;Cane - single point;Shower seat - built in          Prior Functioning/Environment Level of Independence: Independent        Comments: lately has furniture walked        OT Problem List: Decreased strength;Decreased activity tolerance;Decreased knowledge of use of DME or AE;Pain;Impaired balance (sitting and/or standing)      OT Treatment/Interventions: Self-care/ADL training;DME and/or AE instruction;Patient/family education;Balance training;Energy conservation     OT Goals(Current goals can be found in the care plan section) Acute Rehab OT Goals Patient Stated Goal: get strength back OT Goal Formulation: With patient Time For Goal Achievement: 03/24/17 Potential to Achieve Goals: Good ADL Goals Pt Will Perform Lower Body Bathing: with supervision;sit to/from stand Pt Will Perform Lower Body Dressing: with supervision;sit to/from stand Pt Will Transfer to Toilet: with supervision;ambulating (high commode) Pt Will Perform Toileting - Clothing Manipulation and hygiene: with supervision;sit to/from stand Additional ADL Goal #1: pt will initiate at least one rest break for energy conservation  OT Frequency: Min 2X/week   Barriers to D/C:            Co-evaluation              AM-PAC PT "6 Clicks" Daily Activity     Outcome Measure Help from another person eating meals?: None Help from another person taking care of personal grooming?: A Little Help from another person toileting, which includes using toliet, bedpan, or urinal?: A Little Help from another person bathing (including washing, rinsing, drying)?: A Little Help from another person to put on and taking off regular upper body clothing?: A Little Help from another person to put on and taking off regular lower body clothing?: A Little 6 Click Score: 19   End of Session    Activity Tolerance: Patient limited by fatigue Patient left: in bed;with call bell/phone within reach;with bed alarm set  OT Visit Diagnosis: Muscle weakness (generalized) (M62.81)                Time: 9767-3419 OT Time Calculation (min): 23 min Charges:  OT General Charges $OT Visit: 1 Visit OT Evaluation $OT Eval Low Complexity: 1 Low G-Codes:     [place], OTR/L [phone] 03/17/2017  Ratasha Fabre 03/17/2017, 3:39 PM

## 2017-03-17 NOTE — Evaluation (Signed)
Physical Therapy Evaluation Patient Details Name: Scott Newman MRN: [id number] DOB: February 04, 1946 Today's Date: 03/17/2017   History of Present Illness  71 year old male with past medical history significant for hypertension, hypothyroidism, prostate cancer and chronic diastolic CHF and admitted for acute GI bleed.  Pt had EGD and flexible sigmoidoscopy on 03/16/17 revealing Gastric polyps, esophageal narrowing, Sigmodiverticulosis, Retained stool, Rectal clot, radiation proctitis    Clinical Impression  Pt admitted with above diagnosis. Pt currently with functional limitations due to the deficits listed below (see PT Problem List).  Pt will benefit from skilled PT to increase their independence and safety with mobility to allow discharge to the venue listed below.  Pt reports weakness however able to tolerate ambulating in hallway with RW.  Pt reports not ambulating in hallway since admission (03/10/17) and would benefit from RW and HHPT upon d/c.     Follow Up Recommendations Home health PT    Equipment Recommendations  Rolling walker with 5" wheels    Recommendations for Other Services       Precautions / Restrictions Precautions Precautions: Fall      Mobility  Bed Mobility Overal bed mobility: Needs Assistance Bed Mobility: Supine to Sit     Supine to sit: Supervision;HOB elevated Sit to supine: Supervision;HOB elevated      Transfers Overall transfer level: Needs assistance Equipment used: Rolling walker (2 wheeled) Transfers: Sit to/from Stand Sit to Stand: Min guard         General transfer comment: increased time and effort, verbal cues for hand placement  Ambulation/Gait Ambulation/Gait assistance: Min guard Ambulation Distance (Feet): 120 Feet Assistive device: Rolling walker (2 wheeled) Gait Pattern/deviations: Step-through pattern;Decreased stride length     General Gait Details: verbal cues for safe use of RW, pt reports weakness in LEs, denies  dizziness, distance to tolerance  Stairs            Wheelchair Mobility    Modified Rankin (Stroke Patients Only)       Balance Overall balance assessment: Needs assistance         Standing balance support: Bilateral upper extremity supported Standing balance-Leahy Scale: Poor Standing balance comment: requiring UE support                             Pertinent Vitals/Pain Pain Assessment: No/denies pain    Home Living Family/patient expects to be discharged to:: Private residence Living Arrangements: Spouse/significant other   Type of Home: House Home Access: Stairs to enter Entrance Stairs-Rails: None Entrance Stairs-Number of Steps: 3 Home Layout: Able to live on main level with bedroom/bathroom Home Equipment: None      Prior Function Level of Independence: Independent               Hand Dominance        Extremity/Trunk Assessment        Lower Extremity Assessment Lower Extremity Assessment: Generalized weakness       Communication   Communication: No difficulties  Cognition Arousal/Alertness: Awake/alert Behavior During Therapy: WFL for tasks assessed/performed Overall Cognitive Status: Within Functional Limits for tasks assessed                                        General Comments      Exercises     Assessment/Plan    PT Assessment Patient needs continued  PT services  PT Problem List Decreased strength;Decreased mobility;Decreased activity tolerance;Decreased knowledge of use of DME       PT Treatment Interventions Gait training;DME instruction;Therapeutic activities;Therapeutic exercise;Patient/family education;Functional mobility training    PT Goals (Current goals can be found in the Care Plan section)  Acute Rehab PT Goals PT Goal Formulation: With patient Time For Goal Achievement: 03/31/17 Potential to Achieve Goals: Good    Frequency Min 3X/week   Barriers to discharge         Co-evaluation               AM-PAC PT "6 Clicks" Daily Activity  Outcome Measure Difficulty turning over in bed (including adjusting bedclothes, sheets and blankets)?: A Little Difficulty moving from lying on back to sitting on the side of the bed? : A Little Difficulty sitting down on and standing up from a chair with arms (e.g., wheelchair, bedside commode, etc,.)?: A Little Help needed moving to and from a bed to chair (including a wheelchair)?: A Little Help needed walking in hospital room?: A Little Help needed climbing 3-5 steps with a railing? : A Lot 6 Click Score: 17    End of Session Equipment Utilized During Treatment: Gait belt Activity Tolerance: Patient tolerated treatment well Patient left: in bed;with call bell/phone within reach;with bed alarm set Nurse Communication: Mobility status PT Visit Diagnosis: Difficulty in walking, not elsewhere classified (R26.2);Muscle weakness (generalized) (M62.81)    Time: 6644-0347 PT Time Calculation (min) (ACUTE ONLY): 26 min   Charges:   PT Evaluation $PT Eval Low Complexity: 1 Low     PT G Codes:        Carmelia Bake, PT, DPT 03/17/2017 Pager: [phone]   York Ram E 03/17/2017, 12:26 PM

## 2017-03-18 ENCOUNTER — Other Ambulatory Visit: Payer: Self-pay | Admitting: Hematology

## 2017-03-18 DIAGNOSIS — D500 Iron deficiency anemia secondary to blood loss (chronic): Secondary | ICD-10-CM

## 2017-03-18 LAB — CBC WITH DIFFERENTIAL/PLATELET
BASOS ABS: 0 10*3/uL (ref 0.0–0.1)
BASOS PCT: 0 %
EOS PCT: 2 %
Eosinophils Absolute: 0.2 10*3/uL (ref 0.0–0.7)
HEMATOCRIT: 22.8 % — AB (ref 39.0–52.0)
HEMOGLOBIN: 6.8 g/dL — AB (ref 13.0–17.0)
LYMPHS PCT: 9 %
Lymphs Abs: 0.8 10*3/uL (ref 0.7–4.0)
MCH: 24 pg — AB (ref 26.0–34.0)
MCHC: 29.8 g/dL — AB (ref 30.0–36.0)
MCV: 80.6 fL (ref 78.0–100.0)
MONOS PCT: 8 %
Monocytes Absolute: 0.8 10*3/uL (ref 0.1–1.0)
NEUTROS ABS: 7.6 10*3/uL (ref 1.7–7.7)
Neutrophils Relative %: 81 %
Platelets: 246 10*3/uL (ref 150–400)
RBC: 2.83 MIL/uL — ABNORMAL LOW (ref 4.22–5.81)
RDW: 29 % — ABNORMAL HIGH (ref 11.5–15.5)
WBC: 9.4 10*3/uL (ref 4.0–10.5)

## 2017-03-18 MED ORDER — AMOXICILLIN 500 MG PO CAPS: 1000.0000 mg | ORAL_CAPSULE | Freq: Two times a day (BID) | ORAL | 0 refills | Status: DC

## 2017-03-18 MED ORDER — AMOXICILLIN 250 MG PO CAPS
1000.0000 mg | ORAL_CAPSULE | Freq: Two times a day (BID) | ORAL | Status: DC
  Administered 2017-03-18: 1000 mg via ORAL
  Filled 2017-03-18: qty 4

## 2017-03-18 MED ORDER — WITCH HAZEL-GLYCERIN EX PADS: MEDICATED_PAD | CUTANEOUS | 12 refills | Status: DC | PRN

## 2017-03-18 MED ORDER — SENNOSIDES-DOCUSATE SODIUM 8.6-50 MG PO TABS: 2.0000 | ORAL_TABLET | Freq: Every day | ORAL | 0 refills | Status: DC

## 2017-03-18 MED ORDER — FERROUS SULFATE 325 (65 FE) MG PO TABS
325.0000 mg | ORAL_TABLET | Freq: Three times a day (TID) | ORAL | Status: DC
  Administered 2017-03-18: 325 mg via ORAL
  Filled 2017-03-18: qty 1

## 2017-03-18 MED ORDER — CLARITHROMYCIN 500 MG PO TABS: 500.0000 mg | ORAL_TABLET | Freq: Two times a day (BID) | ORAL | 0 refills | Status: DC

## 2017-03-18 MED ORDER — PANTOPRAZOLE SODIUM 40 MG PO TBEC: 40.0000 mg | DELAYED_RELEASE_TABLET | Freq: Two times a day (BID) | ORAL | 0 refills | Status: DC

## 2017-03-18 MED ORDER — FERROUS SULFATE 325 (65 FE) MG PO TABS: 325.0000 mg | ORAL_TABLET | Freq: Two times a day (BID) | ORAL | 3 refills | Status: DC

## 2017-03-18 MED ORDER — CLARITHROMYCIN 250 MG PO TABS
500.0000 mg | ORAL_TABLET | Freq: Two times a day (BID) | ORAL | Status: DC
  Administered 2017-03-18: 500 mg via ORAL
  Filled 2017-03-18: qty 2

## 2017-03-18 MED ORDER — POLYETHYLENE GLYCOL 3350 17 G PO PACK: 17.0000 g | PACK | Freq: Every day | ORAL | 0 refills | Status: DC

## 2017-03-18 MED ORDER — PANTOPRAZOLE SODIUM 40 MG PO TBEC
40.0000 mg | DELAYED_RELEASE_TABLET | Freq: Two times a day (BID) | ORAL | Status: DC
  Administered 2017-03-18: 40 mg via ORAL
  Filled 2017-03-18: qty 1

## 2017-03-18 MED ORDER — SENNOSIDES-DOCUSATE SODIUM 8.6-50 MG PO TABS: 2.0000 | ORAL_TABLET | Freq: Every day | ORAL | Status: DC

## 2017-03-18 MED ORDER — FOLIC ACID 1 MG PO TABS: 1.0000 mg | ORAL_TABLET | Freq: Every day | ORAL | 0 refills | Status: DC

## 2017-03-18 NOTE — Plan of Care (Addendum)
Problem: Safety: Goal: Ability to remain free from injury will improve Outcome: Progressing Pt becoming stronger and able to accomplish ADL's with less assistance.

## 2017-03-18 NOTE — Progress Notes (Signed)
Spoke with pt's wife who selected [place] for [facility] needs. Referral given to in house rep.

## 2017-03-18 NOTE — Progress Notes (Signed)
[facility] Gastroenterology Progress Note  Scott Newman 71 y.o. Jul 28, 1945  CC:  Symptomatic anemia   Subjective:  No acute issues overnight. Had bowel movement early morning which was brown in color. Denied any blood in the stool or bright blood per rectum. Tolerating soft diet.  ROS : Denied chest pain and shortness of breath. His weakness is improving.  Objective: Vital signs in last 24 hours: Vitals:   03/17/17 2039 03/18/17 0438  BP: (!) 121/52 (!) 123/53  Pulse: 77 73  Resp: 18 16  Temp: 98.4 F (36.9 C) 98.6 F (37 C)  SpO2: 97% 95%    Physical Exam:  General:  Alert, cooperative, no distress, appears stated age  Head:  Normocephalic, without obvious abnormality, atraumatic  Eyes:  , EOM's intact,   Lungs:   Occasional expiratory rhonchi   Heart:  Regular rate and rhythm, S1, S2 normal  Abdomen:   Soft, non-tender, bowel sounds active all four quadrants,  no masses, No peritoneal signs Nondistended   Extremities: Extremities normal, atraumatic, no  edema  Pulses: 2+ and symmetric    Lab Results:  Recent Labs  03/17/17 0355  NA 132*  K 3.8  CL 103  CO2 20*  GLUCOSE 132*  BUN 9  CREATININE 1.05  CALCIUM 8.7*   No results for input(s): AST, ALT, ALKPHOS, BILITOT, PROT, ALBUMIN in the last 72 hours.  Recent Labs  03/17/17 0355 03/18/17 0405  WBC 13.0* 9.4  NEUTROABS 11.7* 7.6  HGB 6.5* 6.8*  HCT 21.6* 22.8*  MCV 79.7 80.6  PLT 268 246   No results for input(s): LABPROT, INR in the last 72 hours.    Assessment/Plan: - radiation proctitis with active bleeding. Status post APC on 03/16/2017  - biopsy positive for H. Pylori. - Symptomatic anemia with history of intermittent black stool as well as bright red blood per rectum. - Severe iron deficiency anemia. - History of NSAID use. - History of prostate cancer status post radiation - Patient is Jehovah's Witness  Recommendations -------------------------- - biopsy positive for H. Pylori.  Start  treatment with amoxicillin and clarithromycin for 14 days. Continue twice a day PPI while on antibiotics and then change to once a day for another 2 weeks. - consider repeat flexible sig with possible RFA by Dr. Watt Climes as an outpatient - Follow-up with me in GI clinic in 3-4 weeks discharge - Gerty to discharge from GI standpoint. GI will sign off. Call us back if needed  Otis Brace MD, [place] 03/18/2017, 8:28 AM  Pager [phone]  If no answer or after 5 PM call [phone]

## 2017-03-18 NOTE — Care Management Important Message (Signed)
Important Message  Patient Details  Name: Scott Newman MRN: [id number] Date of Birth: [birth year]   Medicare Important Message Given:  Yes    Kerin Salen 03/18/2017, 11:43 AMImportant Message  Patient Details  Name: Scott Newman MRN: [id number] Date of Birth: [birth year]   Medicare Important Message Given:  Yes    Kerin Salen 03/18/2017, 11:43 AM

## 2017-03-18 NOTE — Discharge Summary (Signed)
Physician Discharge Summary  Scott Newman SAY:[id number] DOB: 1946-01-31 DOA: 03/10/2017  PCP: Jani Gravel, MD  Admit date: 03/10/2017 Discharge date: 03/18/2017  Admitted From: diet is married cancer and he has a pacemaker that's why have an MRI and is creatinine is 4.5 high gap is a 71 year old with a meal recently for admission acute inferior DispositionHOME  Recommendations for Outpatient Follow-up:  1. Follow up with PCP in 1-2 weeks 2. Please obtain BMP/CBC in one week  Home Health:YES Equipment/DeviceS  Discharge Condition:STABLE CODE STATUSFULL Diet recommendation: REGULAR Brief/Interim Summary:PATIENT ADMITTED WITH GI BLEED.FOUND TO HAVE BLEEDING FROM RADIATION PROCTITIS.HE HAD EGD AND FLEX SIG.H PTLORI POSITIVE.STARTED TRIPLE TREATMENTS.HE RECEIVED FERAHEME AND IVF.HB 6.7 ON THE DAY OF DC.  Discharge Diagnoses:  Principal Problem:   GI bleeding Active Problems:   Essential hypertension   Chronic diastolic heart failure (HCC)   Hypothyroidism, acquired   Anemia, iron deficiency  H PYLORI POSITIVE TREAT WITH AMOX PLUS CLARITHROMYCIN AND PPI.  LOWER GI BLEED STABLE.NO BLOOD TRANSFUSION AS HE IS JEHOWAH WITNESS.  Discharge Instructions   Allergies as of 03/18/2017      Reactions   Acetaminophen Nausea And Vomiting      Medication List    STOP taking these medications   aspirin 325 MG tablet     TAKE these medications   amoxicillin 500 MG capsule Commonly known as:  AMOXIL Take 2 capsules (1,000 mg total) by mouth 2 (two) times daily.   clarithromycin 500 MG tablet Commonly known as:  BIAXIN Take 1 tablet (500 mg total) by mouth 2 (two) times daily.   COQ10 PO Take 60 mg by mouth daily.   ferrous sulfate 325 (65 FE) MG tablet Take 1 tablet (325 mg total) by mouth 2 (two) times daily with a meal.   folic acid 1 MG tablet Commonly known as:  FOLVITE Take 1 tablet (1 mg total) by mouth daily.   levothyroxine 50 MCG tablet Commonly known as:   SYNTHROID, LEVOTHROID Take 50 mcg by mouth daily before breakfast.   lisinopril 20 MG tablet Commonly known as:  PRINIVIL,ZESTRIL TAKE 1 TABLET BY MOUTH EVERY DAY   LORazepam 0.5 MG tablet Commonly known as:  ATIVAN Take 0.125-0.25 mg by mouth at bedtime as needed for anxiety or sleep.   Lutein 40 MG Caps Take 1 capsule by mouth daily.   MULTI-VITAMIN PO Take 1 tablet by mouth every evening.   pantoprazole 40 MG tablet Commonly known as:  PROTONIX Take 1 tablet (40 mg total) by mouth 2 (two) times daily.   polyethylene glycol packet Commonly known as:  MIRALAX / GLYCOLAX Take 17 g by mouth daily.   senna-docusate 8.6-50 MG tablet Commonly known as:  Senokot-S Take 2 tablets by mouth at bedtime.   VISINE OP Apply 1 drop to eye daily as needed (dry eyes).   Vitamin D 2000 units Caps Take 2,000 Units by mouth every evening.   witch hazel-glycerin pad Commonly known as:  TUCKS Apply topically as needed for itching.            Discharge Care Instructions        Start     Ordered   03/19/17 0000  polyethylene glycol (MIRALAX / GLYCOLAX) packet  Daily     03/18/17 1013   03/18/17 0000  amoxicillin (AMOXIL) 500 MG capsule  2 times daily     03/18/17 1013   03/18/17 0000  clarithromycin (BIAXIN) 500 MG tablet  2 times daily  03/18/17 1013   02/72/53 6644  folic acid (FOLVITE) 1 MG tablet  Daily     03/18/17 1013   03/18/17 0000  pantoprazole (PROTONIX) 40 MG tablet  2 times daily     03/18/17 1013   03/18/17 0000  witch hazel-glycerin (TUCKS) pad  As needed     03/18/17 1013   03/18/17 0000  ferrous sulfate 325 (65 FE) MG tablet  2 times daily with meals     03/18/17 1016   03/18/17 0000  senna-docusate (SENOKOT-S) 8.6-50 MG tablet  Daily at bedtime     03/18/17 1016      Allergies  Allergen Reactions  . Acetaminophen Nausea And Vomiting    Consultations:    Procedures/Studies: Dg Chest Port 1 View  Result Date: 03/10/2017 CLINICAL DATA:   Shortness of breath, CHF, anemia, hypertension EXAM: PORTABLE CHEST 1 VIEW COMPARISON:  Portable exam 1850 hours compared to 09/23/2010 FINDINGS: Normal heart size and mediastinal contours. Atherosclerotic calcification aorta. Hazy interstitial opacities in the mid to lower lungs bilaterally which could represent atelectasis or infiltrate. Upper lungs clear. No pleural effusion or pneumothorax. No acute osseous findings. IMPRESSION: Hazy bibasilar opacities which could represent atelectasis or early infiltrate. Electronically Signed   By: Lavonia Dana M.D.   On: 03/10/2017 21:15    (Echo, Carotid, EGD, Colonoscopy, ERCP)    Subjective:   Discharge Exam: Vitals:   03/17/17 2039 03/18/17 0438  BP: (!) 121/52 (!) 123/53  Pulse: 77 73  Resp: 18 16  Temp: 98.4 F (36.9 C) 98.6 F (37 C)  SpO2: 97% 95%   Vitals:   03/17/17 0638 03/17/17 1312 03/17/17 2039 03/18/17 0438  BP: (!) 100/47 (!) 113/51 (!) 121/52 (!) 123/53  Pulse: 93  77 73  Resp: 16 18 18 16   Temp: 99.3 F (37.4 C) 98.7 F (37.1 C) 98.4 F (36.9 C) 98.6 F (37 C)  TempSrc: Oral Oral Oral Oral  SpO2: 91% 91% 97% 95%  Weight:      Height:        General: Pt is alert, awake, not in acute distress Cardiovascular: RRR, S1/S2 +, no rubs, no gallops Respiratory: CTA bilaterally, no wheezing, no rhonchi Abdominal: Soft, NT, ND, bowel sounds + Extremities: no edema, no cyanosis    The results of significant diagnostics from this hospitalization (including imaging, microbiology, ancillary and laboratory) are listed below for reference.     Microbiology: No results found for this or any previous visit (from the past 240 hour(s)).   Labs: BNP (last 3 results)  Recent Labs  03/10/17 1846  BNP 034.7*   Basic Metabolic Panel:  Recent Labs Lab 03/12/17 0509 03/13/17 0407 03/17/17 0355  NA 134* 134* 132*  K 3.9 4.0 3.8  CL 106 105 103  CO2 22 20* 20*  GLUCOSE 86 77 132*  BUN 11 12 9   CREATININE 1.12 1.10  1.05  CALCIUM 8.9 9.1 8.7*   Liver Function Tests: No results for input(s): AST, ALT, ALKPHOS, BILITOT, PROT, ALBUMIN in the last 168 hours. No results for input(s): LIPASE, AMYLASE in the last 168 hours. No results for input(s): AMMONIA in the last 168 hours. CBC:  Recent Labs Lab 03/14/17 0547 03/15/17 0345 03/16/17 0751 03/17/17 0355 03/18/17 0405  WBC 7.2 6.5 6.0 13.0* 9.4  NEUTROABS  --   --   --  11.7* 7.6  HGB 6.1* 6.7* 6.7* 6.5* 6.8*  HCT 20.8* 23.0* 22.8* 21.6* 22.8*  MCV 76.8* 77.7* 78.6 79.7 80.6  PLT 353 353 290 268 246   Cardiac Enzymes: No results for input(s): CKTOTAL, CKMB, CKMBINDEX, TROPONINI in the last 168 hours. BNP: Invalid input(s): POCBNP CBG: No results for input(s): GLUCAP in the last 168 hours. D-Dimer No results for input(s): DDIMER in the last 72 hours. Hgb A1c No results for input(s): HGBA1C in the last 72 hours. Lipid Profile No results for input(s): CHOL, HDL, LDLCALC, TRIG, CHOLHDL, LDLDIRECT in the last 72 hours. Thyroid function studies No results for input(s): TSH, T4TOTAL, T3FREE, THYROIDAB in the last 72 hours.  Invalid input(s): FREET3 Anemia work up No results for input(s): VITAMINB12, FOLATE, FERRITIN, TIBC, IRON, RETICCTPCT in the last 72 hours. Urinalysis    Component Value Date/Time   COLORURINE YELLOW 04/30/2011 1307   APPEARANCEUR CLEAR 04/30/2011 1307   LABSPEC 1.015 04/30/2011 1307   PHURINE 6.5 04/30/2011 1307   GLUCOSEU NEGATIVE 04/30/2011 1307   HGBUR NEGATIVE 04/30/2011 [place] 04/30/2011 [place] 04/30/2011 1307   PROTEINUR NEGATIVE 04/30/2011 1307   UROBILINOGEN 1.0 04/30/2011 1307   NITRITE NEGATIVE 04/30/2011 1307   LEUKOCYTESUR NEGATIVE 04/30/2011 1307   Sepsis Labs Invalid input(s): PROCALCITONIN,  WBC,  LACTICIDVEN Microbiology No results found for this or any previous visit (from the past 240 hour(s)).   Time coordinating discharge: Over 30  minutes  SIGNED:   Georgette Shell, MD  [facility] 03/18/2017, 10:25 AM Pager   If 7PM-7AM, please contact night-coverage [email or web address] Password [email or web address]

## 2017-03-18 NOTE — Progress Notes (Signed)
Pt had a med soft stool at [place]. Brown in color with no black or red color noted.

## 2017-03-21 NOTE — ED Provider Notes (Signed)
[name] DEPT Provider Note   CSN: [id number] Arrival date & time: 03/10/17  1714     History   Chief Complaint Chief Complaint  Patient presents with  . intestinal bleeding  . HGB 5.3    HPI Scott Newman is a 71 y.o. male. CC: Marolyn Haller GI bleeding  HPI:  71 year old male. He is a Restaurant manager, fast food. He has had intermittent bright red rectal bleeding for the last few months. He states that his urologist told him it was an internal hemorrhoid. Has had intermittent dark stools as well. Progressive weakness and fatigue. Noted to have hemoglobin of 5.3 by primary care physician today and referred to the emergency room for further evaluation.  When he exerts he is easily dyspneic and fatigue. He is not syncopal or presyncopal. No bright red blood per rectum over the last several days. Continued dark stool. Is not anticoagulated.  Past Medical History:  Diagnosis Date  . Aortic stenosis   . Arthritis   . CHF (congestive heart failure) ([place])   . Chronic diastolic heart failure ([place])    echocardiogram 12/11: EF 02-54%, grade 1 diastolic dysfunction, mild aortic stenosis with a mean gradient of 6 mm of mercury, moderate mitral annular calcification, mild LAE, trivial pericardial effusion;    Myoview 12/11 (during admission for heart failure): No ischemia or scar, EF 63%.  . Colon polyp   . Depression   . Difficulty swallowing   . Gait abnormality   . Hernia    umbilical and inguinal  . History of alcohol abuse   . History of cellulitis and abscess   . HTN (hypertension)   . Hypothyroidism   . Prostate cancer ([place])   . Slurred speech   . Thyroid disease   . Wears glasses     Patient Active Problem List   Diagnosis Date Noted  . Anemia, iron deficiency 03/12/2017  . GI bleeding 03/10/2017  . Hypothyroidism, acquired 03/10/2017  . Malignant neoplasm of prostate ([place]) 04/14/2015  . Gait difficulty 07/29/2014  . Numbness 07/29/2014  . Colon polyps 03/18/2011  .  ALCOHOL ABUSE 07/24/2010  . Chronic diastolic heart failure ([place]) 07/24/2010  . HYPERLIPIDEMIA 07/22/2010  . Essential hypertension 07/22/2010  . VENOUS INSUFFICIENCY 07/22/2010    Past Surgical History:  Procedure Laterality Date  . APPENDECTOMY    . COLON SURGERY  05/04/11   Lap assisted right colectomy  . EAR CYST EXCISION Left 07/07/2016   Procedure: BRANCHIAL CLEFT CYST EXCISION;  Surgeon: Izora Gala, MD;  Location: [facility];  Service: ENT;  Laterality: Left;  . ESOPHAGOGASTRODUODENOSCOPY (EGD) WITH PROPOFOL N/A 03/16/2017   Procedure: ESOPHAGOGASTRODUODENOSCOPY (EGD) WITH PROPOFOL;  Surgeon: Otis Brace, MD;  [facility];  Service: Gastroenterology;  Laterality: N/A;  . FLEXIBLE SIGMOIDOSCOPY N/A 03/16/2017   Procedure: FLEXIBLE SIGMOIDOSCOPY;  Surgeon: Otis Brace, MD;  [facility];  Service: Gastroenterology;  Laterality: N/A;  . HERNIA REPAIR     umbilical and inguinal- rt  . PROSTATE BIOPSY    . ROOT CANAL         Home Medications    Prior to Admission medications   Medication Sig Start Date End Date Taking? Authorizing Provider  Cholecalciferol (VITAMIN D) 2000 units CAPS Take 2,000 Units by mouth every evening.    Yes [provider]  Coenzyme Q10 (COQ10 PO) Take 60 mg by mouth daily.    Yes [provider]  levothyroxine (SYNTHROID, LEVOTHROID) 50 MCG tablet Take 50 mcg by mouth daily before breakfast.  Yes [provider]  lisinopril (PRINIVIL,ZESTRIL) 20 MG tablet TAKE 1 TABLET BY MOUTH EVERY DAY 06/30/16  Yes Minus Breeding, MD  LORazepam (ATIVAN) 0.5 MG tablet Take 0.125-0.25 mg by mouth at bedtime as needed for anxiety or sleep.  02/14/17  Yes [provider]  Lutein 40 MG CAPS Take 1 capsule by mouth daily.   Yes [provider]  Multiple Vitamin (MULTI-VITAMIN PO) Take 1 tablet by mouth every evening.    Yes [provider]  amoxicillin (AMOXIL) 500 MG capsule Take 2 capsules  (1,000 mg total) by mouth 2 (two) times daily. 03/18/17   Georgette Shell, MD  clarithromycin (BIAXIN) 500 MG tablet Take 1 tablet (500 mg total) by mouth 2 (two) times daily. 03/18/17   Georgette Shell, MD  ferrous sulfate 325 (65 FE) MG tablet Take 1 tablet (325 mg total) by mouth 2 (two) times daily with a meal. 03/18/17   Georgette Shell, MD  folic acid (FOLVITE) 1 MG tablet Take 1 tablet (1 mg total) by mouth daily. 03/18/17   Georgette Shell, MD  pantoprazole (PROTONIX) 40 MG tablet Take 1 tablet (40 mg total) by mouth 2 (two) times daily. 03/18/17   Georgette Shell, MD  polyethylene glycol [facility] / Floria Raveling) packet Take 17 g by mouth daily. 03/19/17   Georgette Shell, MD  senna-docusate (SENOKOT-S) 8.6-50 MG tablet Take 2 tablets by mouth at bedtime. 03/18/17   Georgette Shell, MD  Tetrahydrozoline HCl (VISINE OP) Apply 1 drop to eye daily as needed (dry eyes).    [provider]  witch hazel-glycerin (TUCKS) pad Apply topically as needed for itching. 03/18/17   Georgette Shell, MD    Family History Family History  Problem Relation Age of Onset  . Heart disease Father   . Cancer Paternal Aunt        breast  . Cancer Paternal Uncle        prostate    Social History Social History  Substance Use Topics  . Smoking status: Former Smoker    Quit date: 03/17/1965  . Smokeless tobacco: Never Used  . Alcohol use 0.0 oz/week     Comment: occasional     Allergies   Acetaminophen   Review of Systems Review of Systems  Constitutional: Positive for fatigue. Negative for appetite change, chills, diaphoresis and fever.  HENT: Negative for mouth sores, sore throat and trouble swallowing.   Eyes: Negative for visual disturbance.  Respiratory: Positive for shortness of breath. Negative for cough, chest tightness and wheezing.   Cardiovascular: Negative for chest pain.  Gastrointestinal: Positive for anal bleeding and blood in stool. Negative for  abdominal distention, abdominal pain, diarrhea, nausea and vomiting.       Dark stools.  Endocrine: Negative for polydipsia, polyphagia and polyuria.  Genitourinary: Negative for dysuria, frequency and hematuria.  Musculoskeletal: Negative for gait problem.  Skin: Negative for color change, pallor and rash.  Neurological: Positive for weakness. Negative for dizziness, syncope, light-headedness and headaches.  Hematological: Does not bruise/bleed easily.  Psychiatric/Behavioral: Negative for behavioral problems and confusion.     Physical Exam Updated Vital Signs BP (!) 123/53 (BP Location: Right Arm)   Pulse 73   Temp 98.6 F (37 C) (Oral)   Resp 16   Ht 5\' 9"  (1.753 m)   Wt 80.2 kg (176 lb 12.9 oz)   SpO2 95%   BMI 26.11 kg/m   Physical Exam  Constitutional: He is oriented to  person, place, and time. He appears well-developed and well-nourished. No distress.  HENT:  Head: Normocephalic.  Eyes: Pupils are equal, round, and reactive to light. Conjunctivae are normal. No scleral icterus.  Conjunctivae are pale. No scleral icterus.  Neck: Normal range of motion. Neck supple. No thyromegaly present.  Cardiovascular: Normal rate and regular rhythm.  Exam reveals no gallop and no friction rub.   No murmur heard. Pulmonary/Chest: Effort normal and breath sounds normal. No respiratory distress. He has no wheezes. He has no rales.  Abdominal: Soft. Bowel sounds are normal. He exhibits no distension. There is no tenderness. There is no rebound.  Genitourinary:  Genitourinary Comments: No perirectal disease. No palpable internal hemorrhoids. No visualized external hemorrhoids. No stool in the vault or on the glove after digital rectal exam. Residual is guaiac positive.  Musculoskeletal: Normal range of motion.  Neurological: He is alert and oriented to person, place, and time.  Skin: Skin is warm and dry. No rash noted.  Psychiatric: He has a normal mood and affect. His behavior is  normal.     ED Treatments / Results  Labs (all labs ordered are listed, but only abnormal results are displayed) Labs Reviewed  IRON AND TIBC - Abnormal; Notable for the following:       Result Value   Iron 12 (*)    TIBC 475 (*)    Saturation Ratios 3 (*)    All other components within normal limits  FERRITIN - Abnormal; Notable for the following:    Ferritin 4 (*)    All other components within normal limits  RETICULOCYTES - Abnormal; Notable for the following:    RBC. 2.77 (*)    All other components within normal limits  CBC WITH DIFFERENTIAL/PLATELET - Abnormal; Notable for the following:    RBC 2.77 (*)    Hemoglobin 5.9 (*)    HCT 20.0 (*)    MCV 72.2 (*)    MCH 21.3 (*)    MCHC 29.5 (*)    RDW 18.3 (*)    Lymphs Abs 0.5 (*)    All other components within normal limits  COMPREHENSIVE METABOLIC PANEL - Abnormal; Notable for the following:    Sodium 130 (*)    CO2 18 (*)    Glucose, Bld 100 (*)    All other components within normal limits  BRAIN NATRIURETIC PEPTIDE - Abnormal; Notable for the following:    B Natriuretic Peptide 312.3 (*)    All other components within normal limits  CBC - Abnormal; Notable for the following:    RBC 2.36 (*)    Hemoglobin 5.0 (*)    HCT 16.7 (*)    MCV 70.8 (*)    MCH 21.2 (*)    MCHC 29.9 (*)    RDW 18.1 (*)    All other components within normal limits  BASIC METABOLIC PANEL - Abnormal; Notable for the following:    Sodium 132 (*)    CO2 21 (*)    All other components within normal limits  BASIC METABOLIC PANEL - Abnormal; Notable for the following:    Sodium 134 (*)    All other components within normal limits  CBC - Abnormal; Notable for the following:    RBC 2.42 (*)    Hemoglobin 5.2 (*)    HCT 17.6 (*)    MCV 72.7 (*)    MCH 21.5 (*)    MCHC 29.5 (*)    RDW 18.5 (*)    All other  components within normal limits  BASIC METABOLIC PANEL - Abnormal; Notable for the following:    Sodium 134 (*)    CO2 20 (*)     All other components within normal limits  CBC - Abnormal; Notable for the following:    RBC 2.55 (*)    Hemoglobin 5.5 (*)    HCT 19.2 (*)    MCV 75.3 (*)    MCH 21.6 (*)    MCHC 28.6 (*)    RDW 18.9 (*)    All other components within normal limits  CBC - Abnormal; Notable for the following:    RBC 2.71 (*)    Hemoglobin 6.1 (*)    HCT 20.8 (*)    MCV 76.8 (*)    MCH 22.5 (*)    MCHC 29.3 (*)    RDW 19.5 (*)    All other components within normal limits  CBC - Abnormal; Notable for the following:    RBC 2.96 (*)    Hemoglobin 6.7 (*)    HCT 23.0 (*)    MCV 77.7 (*)    MCH 22.6 (*)    MCHC 29.1 (*)    RDW 22.3 (*)    All other components within normal limits  CBC - Abnormal; Notable for the following:    RBC 2.90 (*)    Hemoglobin 6.7 (*)    HCT 22.8 (*)    MCH 23.1 (*)    MCHC 29.4 (*)    RDW 25.5 (*)    All other components within normal limits  CBC WITH DIFFERENTIAL/PLATELET - Abnormal; Notable for the following:    WBC 13.0 (*)    RBC 2.71 (*)    Hemoglobin 6.5 (*)    HCT 21.6 (*)    MCH 24.0 (*)    RDW 27.6 (*)    Neutro Abs 11.7 (*)    All other components within normal limits  BASIC METABOLIC PANEL - Abnormal; Notable for the following:    Sodium 132 (*)    CO2 20 (*)    Glucose, Bld 132 (*)    Calcium 8.7 (*)    All other components within normal limits  CBC WITH DIFFERENTIAL/PLATELET - Abnormal; Notable for the following:    RBC 2.83 (*)    Hemoglobin 6.8 (*)    HCT 22.8 (*)    MCH 24.0 (*)    MCHC 29.8 (*)    RDW 29.0 (*)    All other components within normal limits  POC OCCULT BLOOD, ED - Abnormal; Notable for the following:    Fecal Occult Bld POSITIVE (*)    All other components within normal limits  VITAMIN B12  FOLATE  PROTIME-INR  TROPONIN I  OSMOLALITY, URINE  SODIUM, URINE, RANDOM  TYPE AND SCREEN  ABO/RH  SURGICAL PATHOLOGY    EKG  EKG Interpretation  Date/Time:  Thursday March 10 2017 18:53:18 EDT Ventricular Rate:   83 PR Interval:    QRS Duration: 107 QT Interval:  399 QTC Calculation: 469 R Axis:   24 Text Interpretation:  Sinus rhythm Baseline wander in lead(s) V3 Confirmed by Tanna Furry [phone]) on 03/13/2017 6:08:13 PM       Radiology No results found.  Procedures Procedures (including critical care time)  Medications Ordered in ED Medications  ferumoxytol (FERAHEME) 510 mg in sodium chloride 0.9 % 100 mL IVPB (0 mg Intravenous Stopped 03/12/17 0704)  pantoprazole (PROTONIX) 80 mg in sodium chloride 0.9 % 100 mL IVPB (0 mg Intravenous Stopped  03/12/17 0704)  Darbepoetin Alfa (ARANESP) injection 60 mcg (60 mcg Subcutaneous Given 03/12/17 0908)  sodium phosphate (FLEET) 7-19 GM/118ML enema 1 enema (1 enema Rectal Given 03/16/17 0830)  sodium phosphate (FLEET) 7-19 GM/118ML enema 1 enema ( Rectal MAR Unhold 03/16/17 1303)  ferumoxytol (FERAHEME) 510 mg in sodium chloride 0.9 % 100 mL IVPB (0 mg Intravenous Stopped 03/16/17 1814)     Initial Impression / Assessment and Plan / ED Course  I have reviewed the triage vital signs and the nursing notes.  Pertinent labs & imaging results that were available during my care of the patient were reviewed by me and considered in my medical decision making (see chart for details).    Long discussion with patient. He is adamant that he would not like to have any human blood products due to his religious preferences. He states he would rather have death then blood transfusion.  I've given him IV Ferra-heme infusion. Firmed normal coagulation studies and platelets. Hemoglobin 5.3. Discussed with Dr. Loleta Books hospitalists. Also discussed with GI with [name]. They will see the patient in the morning and plan upper and lower endoscopy. Patient started on Protonix bolus and drip as well.  CRITICAL CARE Performed by: Tanna Furry JOSEPH   Total critical care time: 30 minutes  Critical care time was exclusive of separately billable procedures and treating other  patients.  Critical care was necessary to treat or prevent imminent or life-threatening deterioration.  Critical care was time spent personally by me on the following activities: development of treatment plan with patient and/or surrogate as well as nursing, discussions with consultants, evaluation of patient's response to treatment, examination of patient, obtaining history from patient or surrogate, ordering and performing treatments and interventions, ordering and review of laboratory studies, ordering and review of radiographic studies, pulse oximetry and re-evaluation of patient's condition.   Final Clinical Impressions(s) / ED Diagnoses   Final diagnoses:  Iron deficiency anemia due to chronic blood loss  GI bleeding    New Prescriptions Discharge Medication List as of 03/18/2017  1:13 PM    START taking these medications   Details  amoxicillin (AMOXIL) 500 MG capsule Take 2 capsules (1,000 mg total) by mouth 2 (two) times daily., Starting Fri 03/18/2017, Normal    clarithromycin (BIAXIN) 500 MG tablet Take 1 tablet (500 mg total) by mouth 2 (two) times daily., Starting Fri 03/18/2017, Normal    ferrous sulfate 325 (65 FE) MG tablet Take 1 tablet (325 mg total) by mouth 2 (two) times daily with a meal., Starting Fri 9/0/3009, Normal    folic acid (FOLVITE) 1 MG tablet Take 1 tablet (1 mg total) by mouth daily., Starting Fri 03/18/2017, Normal    pantoprazole (PROTONIX) 40 MG tablet Take 1 tablet (40 mg total) by mouth 2 (two) times daily., Starting Fri 03/18/2017, Normal    polyethylene glycol (MIRALAX / GLYCOLAX) packet Take 17 g by mouth daily., Starting Sat 03/19/2017, Normal    senna-docusate (SENOKOT-S) 8.6-50 MG tablet Take 2 tablets by mouth at bedtime., Starting Fri 03/18/2017, Normal    witch hazel-glycerin (TUCKS) pad Apply topically as needed for itching., Starting Fri 03/18/2017, Normal         Tanna Furry, MD 03/21/17 [phone]

## 2017-03-23 DIAGNOSIS — E871 Hypo-osmolality and hyponatremia: Secondary | ICD-10-CM | POA: Diagnosis not present

## 2017-03-23 DIAGNOSIS — D500 Iron deficiency anemia secondary to blood loss (chronic): Secondary | ICD-10-CM | POA: Diagnosis not present

## 2017-03-23 DIAGNOSIS — I5032 Chronic diastolic (congestive) heart failure: Secondary | ICD-10-CM | POA: Diagnosis not present

## 2017-03-23 DIAGNOSIS — E039 Hypothyroidism, unspecified: Secondary | ICD-10-CM | POA: Diagnosis not present

## 2017-03-23 DIAGNOSIS — I110 Hypertensive heart disease with heart failure: Secondary | ICD-10-CM | POA: Diagnosis not present

## 2017-03-23 DIAGNOSIS — B9681 Helicobacter pylori [H. pylori] as the cause of diseases classified elsewhere: Secondary | ICD-10-CM | POA: Diagnosis not present

## 2017-03-23 DIAGNOSIS — K921 Melena: Secondary | ICD-10-CM | POA: Diagnosis not present

## 2017-03-23 DIAGNOSIS — I350 Nonrheumatic aortic (valve) stenosis: Secondary | ICD-10-CM | POA: Diagnosis not present

## 2017-03-23 DIAGNOSIS — K922 Gastrointestinal hemorrhage, unspecified: Secondary | ICD-10-CM | POA: Diagnosis not present

## 2017-03-25 DIAGNOSIS — I5032 Chronic diastolic (congestive) heart failure: Secondary | ICD-10-CM | POA: Diagnosis not present

## 2017-03-25 DIAGNOSIS — D500 Iron deficiency anemia secondary to blood loss (chronic): Secondary | ICD-10-CM | POA: Diagnosis not present

## 2017-03-25 DIAGNOSIS — E871 Hypo-osmolality and hyponatremia: Secondary | ICD-10-CM | POA: Diagnosis not present

## 2017-03-25 DIAGNOSIS — K922 Gastrointestinal hemorrhage, unspecified: Secondary | ICD-10-CM | POA: Diagnosis not present

## 2017-03-25 DIAGNOSIS — I110 Hypertensive heart disease with heart failure: Secondary | ICD-10-CM | POA: Diagnosis not present

## 2017-03-25 DIAGNOSIS — K921 Melena: Secondary | ICD-10-CM | POA: Diagnosis not present

## 2017-03-25 DIAGNOSIS — B9681 Helicobacter pylori [H. pylori] as the cause of diseases classified elsewhere: Secondary | ICD-10-CM | POA: Diagnosis not present

## 2017-03-25 DIAGNOSIS — E039 Hypothyroidism, unspecified: Secondary | ICD-10-CM | POA: Diagnosis not present

## 2017-03-25 DIAGNOSIS — I350 Nonrheumatic aortic (valve) stenosis: Secondary | ICD-10-CM | POA: Diagnosis not present

## 2017-03-30 DIAGNOSIS — E039 Hypothyroidism, unspecified: Secondary | ICD-10-CM | POA: Diagnosis not present

## 2017-03-30 DIAGNOSIS — I350 Nonrheumatic aortic (valve) stenosis: Secondary | ICD-10-CM | POA: Diagnosis not present

## 2017-03-30 DIAGNOSIS — D500 Iron deficiency anemia secondary to blood loss (chronic): Secondary | ICD-10-CM | POA: Diagnosis not present

## 2017-03-30 DIAGNOSIS — I509 Heart failure, unspecified: Secondary | ICD-10-CM | POA: Diagnosis not present

## 2017-03-30 DIAGNOSIS — K922 Gastrointestinal hemorrhage, unspecified: Secondary | ICD-10-CM | POA: Diagnosis not present

## 2017-03-30 DIAGNOSIS — K929 Disease of digestive system, unspecified: Secondary | ICD-10-CM | POA: Diagnosis not present

## 2017-03-30 DIAGNOSIS — I5032 Chronic diastolic (congestive) heart failure: Secondary | ICD-10-CM | POA: Diagnosis not present

## 2017-03-30 DIAGNOSIS — B9681 Helicobacter pylori [H. pylori] as the cause of diseases classified elsewhere: Secondary | ICD-10-CM | POA: Diagnosis not present

## 2017-03-30 DIAGNOSIS — K921 Melena: Secondary | ICD-10-CM | POA: Diagnosis not present

## 2017-03-30 DIAGNOSIS — E871 Hypo-osmolality and hyponatremia: Secondary | ICD-10-CM | POA: Diagnosis not present

## 2017-03-30 DIAGNOSIS — I110 Hypertensive heart disease with heart failure: Secondary | ICD-10-CM | POA: Diagnosis not present

## 2017-03-30 NOTE — Progress Notes (Signed)
s  HPI The patient presents for followup of hypertension and aortic stenosis which has been very mild. Since I  last saw him he has had an admission with GI bleed recently.  I reviewed these records for this visit.   He was found to have severe radiation proctitis. His hemoglobin was 5.9 when he presented.  I do note that his troponin was negative when he was there. He had an echocardiogram and he was found to have moderate AI and mild ASO which seems to be about same as previous. He presents for follow-up.  Of note his blood pressure was low so he has not been taking the lisinopril at home..  The patient denies any new symptoms such as chest discomfort, neck or arm discomfort. There has been no new shortness of breath, PND or orthopnea. There have been no reported palpitations, presyncope or syncope.  He did have some chest pain when he first presented to the hospital.   Allergies  Allergen Reactions  . Acetaminophen Nausea And Vomiting    Current Outpatient Prescriptions  Medication Sig Dispense Refill  . Cholecalciferol (VITAMIN D) 2000 units CAPS Take 2,000 Units by mouth every evening.     . clarithromycin (BIAXIN) 500 MG tablet Take 1 tablet (500 mg total) by mouth 2 (two) times daily. 28 tablet 0  . Coenzyme Q10 (COQ10 PO) Take 60 mg by mouth daily.     . ferrous sulfate 325 (65 FE) MG tablet Take 1 tablet (325 mg total) by mouth 2 (two) times daily with a meal. 60 tablet 3  . folic acid (FOLVITE) 1 MG tablet Take 1 tablet (1 mg total) by mouth daily. 30 tablet 0  . levothyroxine (SYNTHROID, LEVOTHROID) 50 MCG tablet Take 50 mcg by mouth daily before breakfast.    . LORazepam (ATIVAN) 0.5 MG tablet Take 0.125-0.25 mg by mouth at bedtime as needed for anxiety or sleep.     . Lutein 40 MG CAPS Take 1 capsule by mouth daily.    . Multiple Vitamin (MULTI-VITAMIN PO) Take 1 tablet by mouth every evening.     . pantoprazole (PROTONIX) 40 MG tablet Take 1 tablet (40 mg total) by mouth 2 (two)  times daily. 60 tablet 0  . polyethylene glycol (MIRALAX / GLYCOLAX) packet Take 17 g by mouth daily. 14 each 0  . senna-docusate (SENOKOT-S) 8.6-50 MG tablet Take 2 tablets by mouth at bedtime. 60 tablet 0  . Tetrahydrozoline HCl (VISINE OP) Apply 1 drop to eye daily as needed (dry eyes).    Marland Kitchen witch hazel-glycerin (TUCKS) pad Apply topically as needed for itching. 40 each 12  . lisinopril (PRINIVIL,ZESTRIL) 20 MG tablet TAKE 1 TABLET BY MOUTH EVERY DAY (Patient not taking: Reported on 03/31/2017) 90 tablet 3   No current facility-administered medications for this visit.     Past Medical History:  Diagnosis Date  . Aortic stenosis   . Arthritis   . Chronic diastolic heart failure ([place])    echocardiogram 12/11: EF 80-99%, grade 1 diastolic dysfunction, mild aortic stenosis with a mean gradient of 6 mm of mercury, moderate mitral annular calcification, mild LAE, trivial pericardial effusion;    Myoview 12/11 (during admission for heart failure): No ischemia or scar, EF 63%.  . Colon polyp   . Depression   . Difficulty swallowing   . Gait abnormality   . Hernia    umbilical and inguinal  . History of alcohol abuse   . History of cellulitis and abscess   .  HTN (hypertension)   . Hypothyroidism   . Prostate cancer ([place])   . Slurred speech   . Thyroid disease     Past Surgical History:  Procedure Laterality Date  . APPENDECTOMY    . COLON SURGERY  05/04/11   Lap assisted right colectomy  . EAR CYST EXCISION Left 07/07/2016   Procedure: BRANCHIAL CLEFT CYST EXCISION;  Surgeon: Izora Gala, MD;  Location: [facility];  Service: ENT;  Laterality: Left;  . ESOPHAGOGASTRODUODENOSCOPY (EGD) WITH PROPOFOL N/A 03/16/2017   Procedure: ESOPHAGOGASTRODUODENOSCOPY (EGD) WITH PROPOFOL;  Surgeon: Otis Brace, MD;  [facility];  Service: Gastroenterology;  Laterality: N/A;  . FLEXIBLE SIGMOIDOSCOPY N/A 03/16/2017   Procedure: FLEXIBLE SIGMOIDOSCOPY;  Surgeon: Otis Brace, MD;   [facility];  Service: Gastroenterology;  Laterality: N/A;  . HERNIA REPAIR     umbilical and inguinal- rt  . PROSTATE BIOPSY    . ROOT CANAL      ROS:    As stated in the HPI and negative for all other systems.   PHYSICAL EXAM BP 135/66   Pulse 70   Ht 5\' 9"  (1.753 m)   Wt 177 lb (80.3 kg)   BMI 26.14 kg/m   GENERAL:  Well appearing NECK:  No jugular venous distention, waveform within normal limits, carotid upstroke brisk and symmetric, no bruits, no thyromegaly LUNGS:  Clear to auscultation bilaterally CHEST:  Unremarkable HEART:  PMI not displaced or sustained,S1 and S2 within normal limits, no S3, no S4, no clicks, no rubs, 2/6 apical systolic murmur, no diastolic murmurs ABD:  Flat, positive bowel sounds normal in frequency in pitch, no bruits, no rebound, no guarding, no midline pulsatile mass, no hepatomegaly, no splenomegaly EXT:  2 plus pulses throughout, no edema, no cyanosis no clubbing   ASSESSMENT AND PLAN  DIASTOLIC HF:  He seems to be euvolemic.  No change in therapy is planned.   AS/AI:  This was mild/mod on recent echo.  I will follow this clinically.   HTN:  For now he will remain off of his lisinopril and they will keep a BP diary and if his BP goes up we will restart this.

## 2017-03-31 ENCOUNTER — Ambulatory Visit (INDEPENDENT_AMBULATORY_CARE_PROVIDER_SITE_OTHER): Payer: Medicare Other | Admitting: Cardiology

## 2017-03-31 ENCOUNTER — Encounter: Payer: Self-pay | Admitting: Cardiology

## 2017-03-31 VITALS — BP 135/66 | HR 70 | Ht 69.0 in | Wt 177.0 lb

## 2017-03-31 DIAGNOSIS — I350 Nonrheumatic aortic (valve) stenosis: Secondary | ICD-10-CM

## 2017-03-31 DIAGNOSIS — I10 Essential (primary) hypertension: Secondary | ICD-10-CM | POA: Diagnosis not present

## 2017-03-31 DIAGNOSIS — I5032 Chronic diastolic (congestive) heart failure: Secondary | ICD-10-CM | POA: Diagnosis not present

## 2017-03-31 NOTE — Patient Instructions (Addendum)
Medication Instructions:  Continue current medications  If you need a refill on your cardiac medications before your next appointment, please call your pharmacy.  Labwork: None Ordered  Testing/Procedures: None Ordered  Follow-Up: Your physician wants you to follow-up in: Tuesday November 27th @ 4:00 pm    Thank you for choosing [facility] at [employer]!!

## 2017-04-04 ENCOUNTER — Telehealth: Payer: Self-pay | Admitting: Hematology

## 2017-04-04 NOTE — Telephone Encounter (Signed)
Hospital follow up appt has been scheduled for the pt to see Dr. Burr Medico on 9/27 at 3pm, labs at 230pm. Left the appt date and time on the patient's vm.

## 2017-04-05 ENCOUNTER — Telehealth: Payer: Self-pay | Admitting: Nurse Practitioner

## 2017-04-05 ENCOUNTER — Telehealth: Payer: Self-pay | Admitting: Hematology

## 2017-04-05 DIAGNOSIS — K922 Gastrointestinal hemorrhage, unspecified: Secondary | ICD-10-CM | POA: Diagnosis not present

## 2017-04-05 DIAGNOSIS — K921 Melena: Secondary | ICD-10-CM | POA: Diagnosis not present

## 2017-04-05 DIAGNOSIS — I350 Nonrheumatic aortic (valve) stenosis: Secondary | ICD-10-CM | POA: Diagnosis not present

## 2017-04-05 DIAGNOSIS — E039 Hypothyroidism, unspecified: Secondary | ICD-10-CM | POA: Diagnosis not present

## 2017-04-05 DIAGNOSIS — I5032 Chronic diastolic (congestive) heart failure: Secondary | ICD-10-CM | POA: Diagnosis not present

## 2017-04-05 DIAGNOSIS — D500 Iron deficiency anemia secondary to blood loss (chronic): Secondary | ICD-10-CM | POA: Diagnosis not present

## 2017-04-05 DIAGNOSIS — I110 Hypertensive heart disease with heart failure: Secondary | ICD-10-CM | POA: Diagnosis not present

## 2017-04-05 DIAGNOSIS — E871 Hypo-osmolality and hyponatremia: Secondary | ICD-10-CM | POA: Diagnosis not present

## 2017-04-05 DIAGNOSIS — B9681 Helicobacter pylori [H. pylori] as the cause of diseases classified elsewhere: Secondary | ICD-10-CM | POA: Diagnosis not present

## 2017-04-05 NOTE — Telephone Encounter (Signed)
Lacie, could you let pt's wife know that I would recommend him to follow up with [place] to see if his iron level is adequate, and if he will need more iv iron. Thanks.   Truitt Merle MD

## 2017-04-05 NOTE — Telephone Encounter (Signed)
Patient wife called and wants to know if the appointment with Dr Burr Medico is needed.  Hg has come up to a 9.6 and he has had blood drawn the last 2 weeks.  Can you please call her and let her know either way?

## 2017-04-05 NOTE — Telephone Encounter (Signed)
Scheduled appt per 9/25 sch message - left message with appt date and time.   

## 2017-04-05 NOTE — Telephone Encounter (Signed)
I called the patient's wife to explain we would like Scott Newman to f/u in our clinic to check iron studies. She verbalizes understanding. He is having labs drawn tomorrow per home health to include PSA, CMP, TSH, and lipids. I told her he does not need to fast prior to his lab draw here. I sent message to scheduling for lab and f/u apt here in the next few weeks.

## 2017-04-06 DIAGNOSIS — L820 Inflamed seborrheic keratosis: Secondary | ICD-10-CM | POA: Diagnosis not present

## 2017-04-07 ENCOUNTER — Inpatient Hospital Stay: Payer: Medicare Other | Admitting: Hematology

## 2017-04-07 ENCOUNTER — Other Ambulatory Visit: Payer: Medicare Other

## 2017-04-07 DIAGNOSIS — I350 Nonrheumatic aortic (valve) stenosis: Secondary | ICD-10-CM | POA: Diagnosis not present

## 2017-04-07 DIAGNOSIS — D649 Anemia, unspecified: Secondary | ICD-10-CM | POA: Diagnosis not present

## 2017-04-07 DIAGNOSIS — E039 Hypothyroidism, unspecified: Secondary | ICD-10-CM | POA: Diagnosis not present

## 2017-04-07 DIAGNOSIS — K922 Gastrointestinal hemorrhage, unspecified: Secondary | ICD-10-CM | POA: Diagnosis not present

## 2017-04-07 DIAGNOSIS — B9681 Helicobacter pylori [H. pylori] as the cause of diseases classified elsewhere: Secondary | ICD-10-CM | POA: Diagnosis not present

## 2017-04-07 DIAGNOSIS — K921 Melena: Secondary | ICD-10-CM | POA: Diagnosis not present

## 2017-04-07 DIAGNOSIS — I110 Hypertensive heart disease with heart failure: Secondary | ICD-10-CM | POA: Diagnosis not present

## 2017-04-07 DIAGNOSIS — E871 Hypo-osmolality and hyponatremia: Secondary | ICD-10-CM | POA: Diagnosis not present

## 2017-04-07 DIAGNOSIS — D500 Iron deficiency anemia secondary to blood loss (chronic): Secondary | ICD-10-CM | POA: Diagnosis not present

## 2017-04-07 DIAGNOSIS — I5032 Chronic diastolic (congestive) heart failure: Secondary | ICD-10-CM | POA: Diagnosis not present

## 2017-04-12 DIAGNOSIS — I110 Hypertensive heart disease with heart failure: Secondary | ICD-10-CM | POA: Diagnosis not present

## 2017-04-12 DIAGNOSIS — B9681 Helicobacter pylori [H. pylori] as the cause of diseases classified elsewhere: Secondary | ICD-10-CM | POA: Diagnosis not present

## 2017-04-12 DIAGNOSIS — K921 Melena: Secondary | ICD-10-CM | POA: Diagnosis not present

## 2017-04-12 DIAGNOSIS — I5032 Chronic diastolic (congestive) heart failure: Secondary | ICD-10-CM | POA: Diagnosis not present

## 2017-04-12 DIAGNOSIS — K922 Gastrointestinal hemorrhage, unspecified: Secondary | ICD-10-CM | POA: Diagnosis not present

## 2017-04-12 DIAGNOSIS — E039 Hypothyroidism, unspecified: Secondary | ICD-10-CM | POA: Diagnosis not present

## 2017-04-12 DIAGNOSIS — I350 Nonrheumatic aortic (valve) stenosis: Secondary | ICD-10-CM | POA: Diagnosis not present

## 2017-04-12 DIAGNOSIS — D500 Iron deficiency anemia secondary to blood loss (chronic): Secondary | ICD-10-CM | POA: Diagnosis not present

## 2017-04-12 DIAGNOSIS — E871 Hypo-osmolality and hyponatremia: Secondary | ICD-10-CM | POA: Diagnosis not present

## 2017-04-19 DIAGNOSIS — B9681 Helicobacter pylori [H. pylori] as the cause of diseases classified elsewhere: Secondary | ICD-10-CM | POA: Diagnosis not present

## 2017-04-19 DIAGNOSIS — I350 Nonrheumatic aortic (valve) stenosis: Secondary | ICD-10-CM | POA: Diagnosis not present

## 2017-04-19 DIAGNOSIS — D500 Iron deficiency anemia secondary to blood loss (chronic): Secondary | ICD-10-CM | POA: Diagnosis not present

## 2017-04-19 DIAGNOSIS — K922 Gastrointestinal hemorrhage, unspecified: Secondary | ICD-10-CM | POA: Diagnosis not present

## 2017-04-19 DIAGNOSIS — I110 Hypertensive heart disease with heart failure: Secondary | ICD-10-CM | POA: Diagnosis not present

## 2017-04-19 DIAGNOSIS — K921 Melena: Secondary | ICD-10-CM | POA: Diagnosis not present

## 2017-04-19 DIAGNOSIS — I5032 Chronic diastolic (congestive) heart failure: Secondary | ICD-10-CM | POA: Diagnosis not present

## 2017-04-19 DIAGNOSIS — E871 Hypo-osmolality and hyponatremia: Secondary | ICD-10-CM | POA: Diagnosis not present

## 2017-04-19 DIAGNOSIS — E039 Hypothyroidism, unspecified: Secondary | ICD-10-CM | POA: Diagnosis not present

## 2017-04-25 DIAGNOSIS — Z86010 Personal history of colonic polyps: Secondary | ICD-10-CM | POA: Diagnosis not present

## 2017-04-25 DIAGNOSIS — A048 Other specified bacterial intestinal infections: Secondary | ICD-10-CM | POA: Diagnosis not present

## 2017-04-25 DIAGNOSIS — K627 Radiation proctitis: Secondary | ICD-10-CM | POA: Diagnosis not present

## 2017-04-26 ENCOUNTER — Other Ambulatory Visit: Payer: Self-pay | Admitting: Gastroenterology

## 2017-04-26 DIAGNOSIS — I5032 Chronic diastolic (congestive) heart failure: Secondary | ICD-10-CM | POA: Diagnosis not present

## 2017-04-26 DIAGNOSIS — E039 Hypothyroidism, unspecified: Secondary | ICD-10-CM | POA: Diagnosis not present

## 2017-04-26 DIAGNOSIS — K922 Gastrointestinal hemorrhage, unspecified: Secondary | ICD-10-CM | POA: Diagnosis not present

## 2017-04-26 DIAGNOSIS — E871 Hypo-osmolality and hyponatremia: Secondary | ICD-10-CM | POA: Diagnosis not present

## 2017-04-26 DIAGNOSIS — B9681 Helicobacter pylori [H. pylori] as the cause of diseases classified elsewhere: Secondary | ICD-10-CM | POA: Diagnosis not present

## 2017-04-26 DIAGNOSIS — K921 Melena: Secondary | ICD-10-CM | POA: Diagnosis not present

## 2017-04-26 DIAGNOSIS — I350 Nonrheumatic aortic (valve) stenosis: Secondary | ICD-10-CM | POA: Diagnosis not present

## 2017-04-26 DIAGNOSIS — D500 Iron deficiency anemia secondary to blood loss (chronic): Secondary | ICD-10-CM | POA: Diagnosis not present

## 2017-04-26 DIAGNOSIS — I110 Hypertensive heart disease with heart failure: Secondary | ICD-10-CM | POA: Diagnosis not present

## 2017-04-28 ENCOUNTER — Ambulatory Visit (HOSPITAL_BASED_OUTPATIENT_CLINIC_OR_DEPARTMENT_OTHER): Payer: Medicare Other | Admitting: Nurse Practitioner

## 2017-04-28 ENCOUNTER — Telehealth: Payer: Self-pay | Admitting: Nurse Practitioner

## 2017-04-28 ENCOUNTER — Other Ambulatory Visit (HOSPITAL_BASED_OUTPATIENT_CLINIC_OR_DEPARTMENT_OTHER): Payer: Medicare Other

## 2017-04-28 VITALS — BP 153/63 | HR 70 | Temp 97.5°F | Resp 18 | Ht 69.0 in | Wt 176.7 lb

## 2017-04-28 DIAGNOSIS — D500 Iron deficiency anemia secondary to blood loss (chronic): Secondary | ICD-10-CM

## 2017-04-28 LAB — CBC & DIFF AND RETIC
BASO%: 0.6 % (ref 0.0–2.0)
Basophils Absolute: 0 10*3/uL (ref 0.0–0.1)
EOS%: 3 % (ref 0.0–7.0)
Eosinophils Absolute: 0.2 10*3/uL (ref 0.0–0.5)
HEMATOCRIT: 41.3 % (ref 38.4–49.9)
HGB: 14 g/dL (ref 13.0–17.1)
Immature Retic Fract: 6.9 % (ref 3.00–10.60)
LYMPH#: 0.5 10*3/uL — AB (ref 0.9–3.3)
LYMPH%: 9.3 % — AB (ref 14.0–49.0)
MCH: 31.4 pg (ref 27.2–33.4)
MCHC: 33.9 g/dL (ref 32.0–36.0)
MCV: 92.6 fL (ref 79.3–98.0)
MONO#: 0.5 10*3/uL (ref 0.1–0.9)
MONO%: 10.1 % (ref 0.0–14.0)
NEUT#: 3.9 10*3/uL (ref 1.5–6.5)
NEUT%: 77 % — AB (ref 39.0–75.0)
PLATELETS: 219 10*3/uL (ref 140–400)
RBC: 4.46 10*6/uL (ref 4.20–5.82)
RDW: 21.8 % — ABNORMAL HIGH (ref 11.0–14.6)
Retic %: 1.59 % (ref 0.80–1.80)
Retic Ct Abs: 70.91 10*3/uL (ref 34.80–93.90)
WBC: 5.1 10*3/uL (ref 4.0–10.3)

## 2017-04-28 LAB — TECHNOLOGIST REVIEW

## 2017-04-28 LAB — IRON AND TIBC
%SAT: 26 % (ref 20–55)
Iron: 70 ug/dL (ref 42–163)
TIBC: 265 ug/dL (ref 202–409)
UIBC: 195 ug/dL (ref 117–376)

## 2017-04-28 LAB — FERRITIN: Ferritin: 41 ng/ml (ref 22–316)

## 2017-04-28 NOTE — Telephone Encounter (Signed)
Scheduled appt per 10/18 los - Gave patient AVS and calender per los.  

## 2017-04-28 NOTE — Progress Notes (Signed)
[place]  [phone] [phone]  Clinic Follow up Note   Patient Care Team: Jani Gravel, MD as PCP - General (Internal Medicine) 04/28/2017  Diagnosis: Anemia, Iron deficiency  CURRENT THERAPY: IV Feraheme 03/10/17 and aranesp 03/12/17 during hospitalization   INTERVAL HISTORY: Scott Newman is seen in clinic today as scheduled for hospital follow-up. He was found to have GI bleeding secondary to radiation proctitis from radiation he completed in January 2017. He underwent EGD and flex sigmoid per Dr. Alessandra Bevels during hospitalization who identified radiation proctitis and treated with argon plasma coagulation. He plans to have a repeat procedure on 10/30 with Dr. Watt Climes as outpatient. He received Feraheme on 03/10/17 and 03/16/17 and Aranesp x1 on 03/12/17 for anemia. Since discharge his Bm have become formed but soft and dark. Infrequent hematocheziea. He continues miralax and sennakot. Intermittent left abdominal pain he attributes to gas. He remains moderately fatigued but working with PT at home to regain strength. He denies fever, chills, lightheadedness, dizziness, cough, chest pain, or dyspnea.   REVIEW OF SYSTEMS:   Constitutional: Denies fevers, chills or abnormal weight loss (+) moderate fatigue Eyes: Denies blurriness of vision Ears, nose, mouth, throat, and face: Denies mucositis or sore throat Respiratory: Denies cough, dyspnea or wheezes Cardiovascular: Denies palpitation, chest discomfort or lower extremity swelling Gastrointestinal:  Denies nausea, heartburn, vomiting, diarrhea (+) miralax and sennokot for BM (+) dark stool with rare hematochezia  Skin: Denies abnormal skin rashes Lymphatics: Denies new lymphadenopathy or easy bruising Neurological:Denies numbness, tingling or new weaknesses Behavioral/Psych: Mood is stable, no new changes  MSK (+) ambulates with cane due to deconditioned muscles. Denies fall.  All other systems were reviewed  with the patient and are negative.  MEDICAL HISTORY:  Past Medical History:  Diagnosis Date  . Aortic stenosis   . Arthritis   . Chronic diastolic heart failure ([place])    echocardiogram 12/11: EF 51-88%, grade 1 diastolic dysfunction, mild aortic stenosis with a mean gradient of 6 mm of mercury, moderate mitral annular calcification, mild LAE, trivial pericardial effusion;    Myoview 12/11 (during admission for heart failure): No ischemia or scar, EF 63%.  . Colon polyp   . Depression   . Difficulty swallowing   . Gait abnormality   . Hernia    umbilical and inguinal  . History of alcohol abuse   . History of cellulitis and abscess   . HTN (hypertension)   . Hypothyroidism   . Prostate cancer ([place])   . Slurred speech   . Thyroid disease     SURGICAL HISTORY: Past Surgical History:  Procedure Laterality Date  . APPENDECTOMY    . COLON SURGERY  05/04/11   Lap assisted right colectomy  . EAR CYST EXCISION Left 07/07/2016   Procedure: BRANCHIAL CLEFT CYST EXCISION;  Surgeon: Izora Gala, MD;  Location: [facility];  Service: ENT;  Laterality: Left;  . ESOPHAGOGASTRODUODENOSCOPY (EGD) WITH PROPOFOL N/A 03/16/2017   Procedure: ESOPHAGOGASTRODUODENOSCOPY (EGD) WITH PROPOFOL;  Surgeon: Otis Brace, MD;  Location: [facility];  Service: Gastroenterology;  Laterality: N/A;  . FLEXIBLE SIGMOIDOSCOPY N/A 03/16/2017   Procedure: FLEXIBLE SIGMOIDOSCOPY;  Surgeon: Otis Brace, MD;  Location: [facility];  Service: Gastroenterology;  Laterality: N/A;  . HERNIA REPAIR     umbilical and inguinal- rt  . PROSTATE BIOPSY    . ROOT CANAL      I have reviewed the social history and family history with the patient and they are unchanged from previous note.  ALLERGIES:  is allergic to acetaminophen.  MEDICATIONS:  Current Outpatient Prescriptions  Medication Sig Dispense Refill  . Cholecalciferol (VITAMIN D) 2000 units CAPS Take 2,000 Units by mouth every evening.     . Coenzyme Q10  (COQ10 PO) Take 60 mg by mouth daily.     . ferrous sulfate 325 (65 FE) MG tablet Take 1 tablet (325 mg total) by mouth 2 (two) times daily with a meal. 60 tablet 3  . folic acid (FOLVITE) 1 MG tablet Take 1 tablet (1 mg total) by mouth daily. 30 tablet 0  . levothyroxine (SYNTHROID, LEVOTHROID) 50 MCG tablet Take 50 mcg by mouth daily before breakfast.    . LORazepam (ATIVAN) 0.5 MG tablet Take 0.125-0.25 mg by mouth at bedtime as needed for anxiety or sleep.     . Lutein 40 MG CAPS Take 1 capsule by mouth daily.    . Multiple Vitamin (MULTI-VITAMIN PO) Take 1 tablet by mouth every evening.     . polyethylene glycol (MIRALAX / GLYCOLAX) packet Take 17 g by mouth daily. 14 each 0  . senna-docusate (SENOKOT-S) 8.6-50 MG tablet Take 2 tablets by mouth at bedtime. 60 tablet 0  . Tetrahydrozoline HCl (VISINE OP) Apply 1 drop to eye daily as needed (dry eyes).    Marland Kitchen witch hazel-glycerin (TUCKS) pad Apply topically as needed for itching. 40 each 12  . lisinopril (PRINIVIL,ZESTRIL) 20 MG tablet TAKE 1 TABLET BY MOUTH EVERY DAY (Patient not taking: Reported on 04/28/2017) 90 tablet 3   No current facility-administered medications for this visit.     PHYSICAL EXAMINATION: ECOG PERFORMANCE STATUS: 2 - Symptomatic, <50% confined to bed  Vitals:   04/28/17 1438  BP: (!) 153/63  Pulse: 70  Resp: 18  Temp: (!) 97.5 F (36.4 C)  SpO2: 100%   Filed Weights   04/28/17 1438  Weight: 176 lb 11.2 oz (80.2 kg)    GENERAL:alert, no distress and comfortable SKIN: skin color, texture, turgor are normal, no rashes or significant lesions EYES: normal, Conjunctiva are pink and non-injected, sclera clear OROPHARYNX:no exudate, no erythema and lips, buccal mucosa, and tongue normal  NECK: supple, thyroid normal size, non-tender, without nodularity LYMPH:  no palpable lymphadenopathy  LUNGS: clear to auscultation bilaterally with normal breathing effort HEART: regular rate & rhythm, no murmurs and no lower  extremity edema ABDOMEN:abdomen soft, round, non-tender and normal bowel sounds. No palpable organomegaly or masses. Musculoskeletal:no cyanosis of digits and no clubbing  NEURO: alert & oriented x 3 with fluent speech, no focal motor/sensory deficits  LABORATORY DATA:  I have reviewed the data as listed CBC Latest Ref Rng & Units 04/28/2017 03/18/2017 03/17/2017  WBC 4.0 - 10.3 10e3/uL 5.1 9.4 13.0(H)  Hemoglobin 13.0 - 17.1 g/dL 14.0 6.8(LL) 6.5(LL)  Hematocrit 38.4 - 49.9 % 41.3 22.8(L) 21.6(L)  Platelets 140 - 400 10e3/uL 219 246 268     CMP Latest Ref Rng & Units 03/17/2017 03/13/2017 03/12/2017  Glucose 65 - 99 mg/dL 132(H) 77 86  BUN 6 - 20 mg/dL 9 12 11   Creatinine 0.61 - 1.24 mg/dL 1.05 1.10 1.12  Sodium 135 - 145 mmol/L 132(L) 134(L) 134(L)  Potassium 3.5 - 5.1 mmol/L 3.8 4.0 3.9  Chloride 101 - 111 mmol/L 103 105 106  CO2 22 - 32 mmol/L 20(L) 20(L) 22  Calcium 8.9 - 10.3 mg/dL 8.7(L) 9.1 8.9  Total Protein 6.5 - 8.1 g/dL - - -  Total Bilirubin 0.3 - 1.2 mg/dL - - -  Alkaline  Phos 38 - 126 U/L - - -  AST 15 - 41 U/L - - -  ALT 17 - 63 U/L - - -     RADIOGRAPHIC STUDIES: I have personally reviewed the radiological images as listed and agreed with the findings in the report. No results found.   ASSESSMENT & PLAN: 71 y.o. male with PMH Hypertension, hypothyroidism, prostate cancer and chronic diastolic CHF, admitted to hospital for GI bleeding, anemia, worsening shortness of breath and weakness. Intermittent melena since April 2018. He was on high dose of aspirin for arthritis, which she stopped this GI bleeding. 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   1. Iron deficient anemia, likely secondary to GI bleeding  2. Acute GI bleeding with melena, acute blood lossanemia, possible related to NSAIDs 3. Chronic diastolic CHF 4. HTN  5. Jehovah witness  Scott Newman appears stable today. The source of his anemia has been identified and is being treated on an  ongoing basis per GI.  Hgb is now 14.0 and MCV 92.6, normalized. Iron studies are in the low normal range, Iron 70, transferrin saturation 26% and ferritin 41. However, he continues to have small amount of infrequent GI bleeding.He will have next procedure for APC with Dr. Watt Climes on 05/10/17. I will check labs in 1, 3, and 6 months. He will continue taking oral iron supplement. He will return in 3 months for lab and 6 months for lab and APP f/u.    PLAN:  - labs reviewed, Hgb normal, iron studies normal range -continue oral iron supplementation ferrous sulfate 325 mg tablet daily  - lab in 1, 3 and 6 months, f/u with Dr. Burr Medico in 3 months and APP in 6 months -updated scheduling message sent after encounter  All questions were answered. The patient knows to call the clinic with any increased bleeding, fatigue, problems, questions or concerns. No barriers to learning was detected. I spent 25 counseling the patient face to face. The total time spent in the appointment was 30 and more than 50% was on counseling and review of test results     Alla Feeling, NP 04/28/17

## 2017-04-29 ENCOUNTER — Telehealth: Payer: Self-pay | Admitting: Emergency Medicine

## 2017-04-29 NOTE — Telephone Encounter (Signed)
Called patient and left a detailed message on VM. [name] wants patient to continue with oral iron at this time. Pt does not need IV iron at this time. Pt will need labs in 1, 3 & 6 months. Also a F/U in 3 & 6 Months. Scheduling message sent.

## 2017-05-02 DIAGNOSIS — A048 Other specified bacterial intestinal infections: Secondary | ICD-10-CM | POA: Diagnosis not present

## 2017-05-06 ENCOUNTER — Other Ambulatory Visit: Payer: Self-pay | Admitting: Gastroenterology

## 2017-05-09 ENCOUNTER — Encounter (HOSPITAL_COMMUNITY): Payer: Self-pay | Admitting: *Deleted

## 2017-05-10 ENCOUNTER — Telehealth: Payer: Self-pay | Admitting: Cardiology

## 2017-05-10 ENCOUNTER — Encounter (HOSPITAL_COMMUNITY)
Admission: RE | Disposition: A | Discharge status: Home / Self Care | Payer: Self-pay | Source: Ambulatory Visit | Attending: Gastroenterology

## 2017-05-10 ENCOUNTER — Ambulatory Visit (HOSPITAL_COMMUNITY)
Admission: RE | Admit: 2017-05-10 | Discharge: 2017-05-10 | Disposition: A | Discharge status: Home / Self Care | Payer: Medicare Other | Source: Ambulatory Visit | Attending: Gastroenterology | Admitting: Gastroenterology

## 2017-05-10 ENCOUNTER — Ambulatory Visit (HOSPITAL_COMMUNITY): Payer: Medicare Other | Admitting: Anesthesiology

## 2017-05-10 ENCOUNTER — Encounter (HOSPITAL_COMMUNITY): Payer: Self-pay | Admitting: Anesthesiology

## 2017-05-10 DIAGNOSIS — K5730 Diverticulosis of large intestine without perforation or abscess without bleeding: Secondary | ICD-10-CM | POA: Insufficient documentation

## 2017-05-10 DIAGNOSIS — Z87891 Personal history of nicotine dependence: Secondary | ICD-10-CM | POA: Diagnosis not present

## 2017-05-10 DIAGNOSIS — K921 Melena: Secondary | ICD-10-CM | POA: Insufficient documentation

## 2017-05-10 DIAGNOSIS — Z79899 Other long term (current) drug therapy: Secondary | ICD-10-CM | POA: Diagnosis not present

## 2017-05-10 DIAGNOSIS — I10 Essential (primary) hypertension: Secondary | ICD-10-CM | POA: Diagnosis not present

## 2017-05-10 DIAGNOSIS — E785 Hyperlipidemia, unspecified: Secondary | ICD-10-CM | POA: Diagnosis not present

## 2017-05-10 DIAGNOSIS — K627 Radiation proctitis: Secondary | ICD-10-CM | POA: Diagnosis not present

## 2017-05-10 DIAGNOSIS — K648 Other hemorrhoids: Secondary | ICD-10-CM | POA: Diagnosis not present

## 2017-05-10 HISTORY — DX: Salmonella infection, unspecified: A02.9

## 2017-05-10 HISTORY — PX: FLEXIBLE SIGMOIDOSCOPY: SHX5431

## 2017-05-10 HISTORY — DX: Anemia, unspecified: D64.9

## 2017-05-10 SURGERY — SIGMOIDOSCOPY, FLEXIBLE
Anesthesia: Monitor Anesthesia Care

## 2017-05-10 MED ORDER — SODIUM CHLORIDE 0.9 % IV SOLN: INTRAVENOUS | Status: DC

## 2017-05-10 MED ORDER — PHENYLEPHRINE 40 MCG/ML (10ML) SYRINGE FOR IV PUSH (FOR BLOOD PRESSURE SUPPORT)
PREFILLED_SYRINGE | INTRAVENOUS | Status: DC | PRN
  Administered 2017-05-10 (×2): 80 ug via INTRAVENOUS

## 2017-05-10 MED ORDER — LACTATED RINGERS IV SOLN
INTRAVENOUS | Status: DC
  Administered 2017-05-10: 12:00:00 via INTRAVENOUS

## 2017-05-10 MED ORDER — PROPOFOL 10 MG/ML IV BOLUS
INTRAVENOUS | Status: DC | PRN
  Administered 2017-05-10 (×3): 20 mg via INTRAVENOUS

## 2017-05-10 MED ORDER — PROPOFOL 10 MG/ML IV BOLUS
INTRAVENOUS | Status: AC
  Filled 2017-05-10: qty 60

## 2017-05-10 MED ORDER — PROPOFOL 500 MG/50ML IV EMUL
INTRAVENOUS | Status: DC | PRN
  Administered 2017-05-10: 140 ug/kg/min via INTRAVENOUS

## 2017-05-10 MED ORDER — LIDOCAINE 2% (20 MG/ML) 5 ML SYRINGE
INTRAMUSCULAR | Status: DC | PRN
  Administered 2017-05-10: 100 mg via INTRAVENOUS

## 2017-05-10 MED ORDER — PHENYLEPHRINE 40 MCG/ML (10ML) SYRINGE FOR IV PUSH (FOR BLOOD PRESSURE SUPPORT)
PREFILLED_SYRINGE | INTRAVENOUS | Status: AC
  Filled 2017-05-10: qty 10

## 2017-05-10 MED ORDER — LIDOCAINE 2% (20 MG/ML) 5 ML SYRINGE
INTRAMUSCULAR | Status: AC
  Filled 2017-05-10: qty 5

## 2017-05-10 NOTE — Progress Notes (Signed)
Scott Newman 12:51 PM  Subjective: Recent doing well and his case discussed with my partner Dr. B as well as his wife and we answered all of their questions and is actually seen no blood in the last week  Objective: Vital signs stable afebrile no acute distress exam please see preassessment evaluation hemoglobin okay  Assessment: Radiation proctitis  Plan: Okay to proceed with flexible sigmoidoscopy and possible RFA and the procedure was discussed with the patient and his wife  [facility]  Pager [phone] After 5PM or if no answer call [phone]

## 2017-05-10 NOTE — Anesthesia Preprocedure Evaluation (Signed)
Anesthesia Evaluation  Patient identified by MRN, date of birth, ID band Patient awake    Reviewed: Allergy & Precautions, NPO status , Patient's Chart, lab work & pertinent test results  Airway Mallampati: II  TM Distance: >3 FB Neck ROM: Full    Dental no notable dental hx.    Pulmonary neg pulmonary ROS, former smoker,    Pulmonary exam normal breath sounds clear to auscultation       Cardiovascular hypertension, + Valvular Problems/Murmurs AS  Rhythm:Regular Rate:Normal + Systolic murmurs Left ventricle: The cavity size was normal. Wall thickness was   increased in a pattern of mild LVH. Systolic function was   vigorous. The estimated ejection fraction was in the range of 65%   to 70%. Features are consistent with a pseudonormal left   ventricular filling pattern, with concomitant abnormal relaxation   and increased filling pressure (grade 2 diastolic dysfunction). - Aortic valve: AV is thickened, calcified with mildly restricted   motion. Mean gradient through the valve is 19 mm Hg consistent   with mild AS There was mild to moderate regurgitation. - Mitral valve: There was mild regurgitation. - Left atrium: The atrium was mildly dilated.   Neuro/Psych negative neurological ROS  negative psych ROS   GI/Hepatic negative GI ROS, Neg liver ROS,   Endo/Other  negative endocrine ROS  Renal/GU negative Renal ROS  negative genitourinary   Musculoskeletal negative musculoskeletal ROS (+)   Abdominal   Peds negative pediatric ROS (+)  Hematology negative hematology ROS (+)   Anesthesia Other Findings   Reproductive/Obstetrics negative OB ROS                             Anesthesia Physical Anesthesia Plan  ASA: III  Anesthesia Plan: MAC   Post-op Pain Management:    Induction: Intravenous  PONV Risk Score and Plan: 0  Airway Management Planned: Simple Face Mask  Additional  Equipment:   Intra-op Plan:   Post-operative Plan:   Informed Consent: I have reviewed the patients History and Physical, chart, labs and discussed the procedure including the risks, benefits and alternatives for the proposed anesthesia with the patient or authorized representative who has indicated his/her understanding and acceptance.   Dental advisory given  Plan Discussed with: CRNA and Surgeon  Anesthesia Plan Comments:         Anesthesia Quick Evaluation

## 2017-05-10 NOTE — Telephone Encounter (Signed)
New message   Pt wife verbalized that she wants pt to go back on Lisinopril   But needs the ok from [place]

## 2017-05-10 NOTE — Anesthesia Postprocedure Evaluation (Signed)
Anesthesia Post Note  Patient: Handy Mcloud Dake  Procedure(s) Performed: Flexible Sigmoidoscopy (N/A ) GI RADIOFREQUENCY ABLATION (N/A )     Patient location during evaluation: PACU Anesthesia Type: MAC Level of consciousness: awake and alert Pain management: pain level controlled Vital Signs Assessment: post-procedure vital signs reviewed and stable Respiratory status: spontaneous breathing, nonlabored ventilation, respiratory function stable and patient connected to nasal cannula oxygen Cardiovascular status: stable and blood pressure returned to baseline Postop Assessment: no apparent nausea or vomiting Anesthetic complications: no    Last Vitals:  Vitals:   05/10/17 1154 05/10/17 1353  BP: (!) 175/70 (!) 145/70  Pulse: 73 70  Resp: (!) 27 14  Temp: 36.4 C 36.4 C  SpO2: 100% 100%    Last Pain:  Vitals:   05/10/17 1353  TempSrc: Oral                 Madelynne Lasker S

## 2017-05-10 NOTE — Op Note (Signed)
[facility] Patient Name: Scott Newman Procedure Date: 05/10/2017 MRN: [id number] Attending MD: Clarene Essex , MD Date of Birth: August 22, 1945 CSN: [id number] Age: 71 Admit Type: Outpatient Procedure:                Flexible Sigmoidoscopy Indications:              Hematochezia Providers:                Clarene Essex, MD, Elmer Ramp. Tilden Dome, RN, [employer],                            Technician, Danley Danker, CRNA Referring MD:              Medicines:                Propofol total dose 380 mg IV, 100 mg IV lidocaine Complications:            No immediate complications. Estimated Blood Loss:     Estimated blood loss: none. Procedure:                Pre-Anesthesia Assessment:                           - Prior to the procedure, a History and Physical                            was performed, and patient medications and                            allergies were reviewed. The patient's tolerance of                            previous anesthesia was also reviewed. The risks                            and benefits of the procedure and the sedation                            options and risks were discussed with the patient.                            All questions were answered, and informed consent                            was obtained. Prior Anticoagulants: The patient has                            taken no previous anticoagulant or antiplatelet                            agents. ASA Grade Assessment: II - A patient with                            mild systemic disease. After reviewing the risks  and benefits, the patient was deemed in                            satisfactory condition to undergo the procedure.                           After obtaining informed consent, the scope was                            passed under direct vision. The EG-[id number] ([id number])                            scope was introduced through the anus and advanced                 to the the descending colon. The flexible                            sigmoidoscopy was accomplished without difficulty.                            The patient tolerated the procedure well. The                            quality of the bowel preparation was adequate. Scope In: 1:08:05 PM Scope Out: 1:44:19 PM Total Procedure Duration: 0 hours 36 minutes 14 seconds  Findings:      Internal hemorrhoids were found during retroflexion, during perianal       exam and during digital exam. The hemorrhoids were small.      The mucosa vascular pattern in the rectum was locally increased. A       serpiginous ulcer from previous APC with seen but there was a little bit       of oozing preprocedure Focal radiofrequency ablation of radiation       proctitis in the rectum was performed. With the endoscope in place, the       position and extent of the abnormal mucosa and appropriate anatomic       landmarks were noted. Lavage and suction were performed. The       radiofrequency channel ablation catheter was introduced through the       endoscope working channel. The endoscope with the ablation catheter was       advanced to the areas of abnormal mucosa. The endoscope with the channel       ablation catheter was positioned under direct visualization so that the       catheter was placed in contact with the surface of the abnormal mucosa.       Energy was applied. Ablation was repeated in a likewise fashion to all       visible abnormal mucosa. The channel ablation catheter was then removed       through the endoscope working channel, and the ablation catheter was       cleaned. The endoscope was left in place. The ablation zone was cleaned       of coagulative debris. The ablation catheter was reinserted into the       endoscope working channel. The ablation catheter was removed through the       endoscope working  channel. The areas where abnormal mucosa had been       ablated were  examined. There was no unablated abnormal mucosa present.       The endoscope was then removed.      Scattered small-mouthed diverticula were found in the sigmoid colon and       descending colon.      The exam was otherwise without abnormality. Impression:               - Internal hemorrhoids.                           - Increased mucosa vascular pattern in the rectum.                            Treated with radiofrequency ablation.                           - Diverticulosis in the sigmoid colon and in the                            descending colon.                           - The examination was otherwise normal.                           - No specimens collected. Moderate Sedation:      N/A- Per Anesthesia Care Recommendation:           - Patient has a contact number available for                            emergencies. The signs and symptoms of potential                            delayed complications were discussed with the                            patient. Return to normal activities tomorrow.                            Written discharge instructions were provided to the                            patient.                           - Soft diet today.                           - Continue present medications.                           - Return to GI clinic in 1 month.                           - Telephone GI clinic if symptomatic PRN. Repeat  treatment when necessary Procedure Code(s):        --- Professional ---                           [phone], Sigmoidoscopy, flexible; with ablation of                            tumor(s), polyp(s), or other lesion(s) (includes                            pre- and post-dilation and guide wire passage, when                            performed) Diagnosis Code(s):        --- Professional ---                           K64.8, Other hemorrhoids                           K92.1, Melena (includes Hematochezia)                            K57.30, Diverticulosis of large intestine without                            perforation or abscess without bleeding CPT copyright 2016 American Medical Association. All rights reserved. The codes documented in this report are preliminary and upon coder review may  be revised to meet current compliance requirements. Clarene Essex, MD 05/10/2017 2:01:48 PM This report has been signed electronically. Number of Addenda: 0

## 2017-05-10 NOTE — Telephone Encounter (Signed)
Spoke to wife (ok per DPR)-wife states patients BP has been gradually increasing since their last OV with Dr. Percival Spanish and wife is wondering if they should restart the lisinopril.   Wife states patient had procedure today and his BP was noted to be 186/83 but decreased after anesthesia.  Reports they have also had weekly nurse visits and she has noticed that they are gradually going back up.  BP readings: 156/85, 130/70.  No other readings to report, wife states she often forgets to take his BP at home.   Patient denies HA, blurred vision, cp, SOB, dizziness.    Wife states she just wants to be proactive and see if Dr. Percival Spanish thinks it is time to restart his medication.  Advised I would route to Dr. Percival Spanish to review and will return call with recommendations.  Wife verbalized understanding.    Last OV note 9/20: HTN:  For now he will remain off of his lisinopril and they will keep a BP diary and if his BP goes up we will restart this.

## 2017-05-10 NOTE — Transfer of Care (Signed)
Immediate Anesthesia Transfer of Care Note  Patient: Scott Newman  Procedure(s) Performed: Flexible Sigmoidoscopy (N/A ) GI RADIOFREQUENCY ABLATION (N/A )  Patient Location: Endoscopy Unit  Anesthesia Type:MAC  Level of Consciousness: awake  Airway & Oxygen Therapy: Patient Spontanous Breathing and Patient connected to face mask oxygen  Post-op Assessment: Report given to RN and Post -op Vital signs reviewed and stable  Post vital signs: Reviewed and stable  Last Vitals:  Vitals:   05/10/17 1154  BP: (!) 175/70  Pulse: 73  Resp: (!) 27  Temp: 36.4 C  SpO2: 100%    Last Pain:  Vitals:   05/10/17 1154  TempSrc: Oral         Complications: No apparent anesthesia complications

## 2017-05-10 NOTE — Telephone Encounter (Signed)
Left message to call back  

## 2017-05-10 NOTE — Discharge Instructions (Signed)
YOU HAD AN ENDOSCOPIC PROCEDURE TODAY: Refer to the procedure report and other information in the discharge instructions given to you for any specific questions about what was found during the examination. If this information does not answer your questions, please call [name] office at [phone] to clarify.   YOU SHOULD EXPECT: Some feelings of bloating in the abdomen. Passage of more gas than usual. Walking can help get rid of the air that was put into your GI tract during the procedure and reduce the bloating. If you had a lower endoscopy (such as a colonoscopy or flexible sigmoidoscopy) you may notice spotting of blood in your stool or on the toilet paper. Some abdominal soreness may be present for a day or two, also.  DIET: Your first meal following the procedure should be a light meal and then it is ok to progress to your normal diet. A half-sandwich or bowl of soup is an example of a good first meal. Heavy or fried foods are harder to digest and may make you feel nauseous or bloated. Drink plenty of fluids but you should avoid alcoholic beverages for 24 hours. If you had a esophageal dilation, please see attached instructions for diet.   ACTIVITY: Your care partner should take you home directly after the procedure. You should plan to take it easy, moving slowly for the rest of the day. You can resume normal activity the day after the procedure however YOU SHOULD NOT DRIVE, use power tools, machinery or perform tasks that involve climbing or major physical exertion for 24 hours (because of the sedation medicines used during the test).   SYMPTOMS TO REPORT IMMEDIATELY: A gastroenterologist can be reached at any hour. Please call [phone]  for any of the following symptoms:   Following upper endoscopy (EGD, EUS, ERCP, esophageal dilation) Vomiting of blood or coffee ground material  New, significant abdominal pain  New, significant chest pain or pain under the shoulder blades  Painful or  persistently difficult swallowing  New shortness of breath  Black, tarry-looking or red, bloody stools  FOLLOW UP:  If any biopsies were taken you will be contacted by phone or by letter within the next 1-3 weeks. Call [phone]  if you have not heard about the biopsies in 3 weeks.  Please also call with any specific questions about appointments or follow up tests. Call if question or problem otherwise follow-up in one month

## 2017-05-10 NOTE — Anesthesia Procedure Notes (Signed)
Date/Time: 05/10/2017 12:58 PM Performed by: Danley Danker L Oxygen Delivery Method: Simple face mask

## 2017-05-11 ENCOUNTER — Encounter (HOSPITAL_COMMUNITY): Payer: Self-pay | Admitting: Gastroenterology

## 2017-05-12 DIAGNOSIS — K922 Gastrointestinal hemorrhage, unspecified: Secondary | ICD-10-CM | POA: Diagnosis not present

## 2017-05-12 DIAGNOSIS — E871 Hypo-osmolality and hyponatremia: Secondary | ICD-10-CM | POA: Diagnosis not present

## 2017-05-12 DIAGNOSIS — E039 Hypothyroidism, unspecified: Secondary | ICD-10-CM | POA: Diagnosis not present

## 2017-05-12 DIAGNOSIS — K921 Melena: Secondary | ICD-10-CM | POA: Diagnosis not present

## 2017-05-12 DIAGNOSIS — I110 Hypertensive heart disease with heart failure: Secondary | ICD-10-CM | POA: Diagnosis not present

## 2017-05-12 DIAGNOSIS — I5032 Chronic diastolic (congestive) heart failure: Secondary | ICD-10-CM | POA: Diagnosis not present

## 2017-05-12 DIAGNOSIS — I350 Nonrheumatic aortic (valve) stenosis: Secondary | ICD-10-CM | POA: Diagnosis not present

## 2017-05-12 DIAGNOSIS — B9681 Helicobacter pylori [H. pylori] as the cause of diseases classified elsewhere: Secondary | ICD-10-CM | POA: Diagnosis not present

## 2017-05-12 DIAGNOSIS — D500 Iron deficiency anemia secondary to blood loss (chronic): Secondary | ICD-10-CM | POA: Diagnosis not present

## 2017-05-12 NOTE — Telephone Encounter (Signed)
Can start lisinopril 2.5 mg po daily and keep an eye on the BP.

## 2017-05-13 MED ORDER — LISINOPRIL 2.5 MG PO TABS: 2.5000 mg | ORAL_TABLET | Freq: Every day | ORAL | 3 refills | Status: DC

## 2017-05-13 NOTE — Telephone Encounter (Signed)
Left detailed message (ok per DPR) with recommendations.   rx sent to pharmacy, advised to call back with questions or concerns.

## 2017-05-13 NOTE — Addendum Note (Signed)
Addended by: Patria Mane A on: 05/13/2017 10:27 AM   Modules accepted: Orders

## 2017-05-27 DIAGNOSIS — D500 Iron deficiency anemia secondary to blood loss (chronic): Secondary | ICD-10-CM | POA: Diagnosis not present

## 2017-05-27 DIAGNOSIS — I110 Hypertensive heart disease with heart failure: Secondary | ICD-10-CM | POA: Diagnosis not present

## 2017-05-27 DIAGNOSIS — I350 Nonrheumatic aortic (valve) stenosis: Secondary | ICD-10-CM | POA: Diagnosis not present

## 2017-05-27 DIAGNOSIS — E039 Hypothyroidism, unspecified: Secondary | ICD-10-CM | POA: Diagnosis not present

## 2017-05-27 DIAGNOSIS — B9681 Helicobacter pylori [H. pylori] as the cause of diseases classified elsewhere: Secondary | ICD-10-CM | POA: Diagnosis not present

## 2017-05-27 DIAGNOSIS — K922 Gastrointestinal hemorrhage, unspecified: Secondary | ICD-10-CM | POA: Diagnosis not present

## 2017-05-27 DIAGNOSIS — K921 Melena: Secondary | ICD-10-CM | POA: Diagnosis not present

## 2017-05-27 DIAGNOSIS — I5032 Chronic diastolic (congestive) heart failure: Secondary | ICD-10-CM | POA: Diagnosis not present

## 2017-05-27 DIAGNOSIS — E871 Hypo-osmolality and hyponatremia: Secondary | ICD-10-CM | POA: Diagnosis not present

## 2017-05-31 ENCOUNTER — Other Ambulatory Visit: Payer: Medicare Other

## 2017-06-06 NOTE — Progress Notes (Signed)
s    HPI The patient presents for followup of hypertension and aortic stenosis which has been mild.   Since I last saw him he has done well.  He was having physical therapy come to the house but to fatigued for this.  He is denying any new cardiovascular symptoms.  He did have an ablation for bleeding issues associated with his radiation proctitis and is had no further bleeding.  He did have some high blood pressures called by his home health nurse and I added lisinopril 2.5 mg to his home regimen and his blood pressure has been well controlled.  He denies any acute cardiovascular symptoms.  He has had no chest pressure, neck or arm discomfort.  He is not had any palpitations, presyncope or syncope.  He has had no weight gain or edema.  Allergies  Allergen Reactions  . Other     No blood products   . Acetaminophen Other (See Comments)    DOESN'T MAKE HIM FEEL WELL     Current Outpatient Medications  Medication Sig Dispense Refill  . b complex vitamins tablet Take 1 tablet by mouth daily.    . Cholecalciferol (VITAMIN D) 2000 units CAPS Take 2,000 Units by mouth every evening.     . Coenzyme Q10 (COQ10 PO) Take 100 mg by mouth daily.     . ferrous sulfate 325 (65 FE) MG tablet Take 1 tablet (325 mg total) by mouth 2 (two) times daily with a meal. 60 tablet 3  . folic acid (FOLVITE) 1 MG tablet Take 1 tablet (1 mg total) by mouth daily. 30 tablet 0  . folic acid (FOLVITE) 627 MCG tablet Take 1,200 mcg by mouth daily.    Marland Kitchen levothyroxine (SYNTHROID, LEVOTHROID) 50 MCG tablet Take 50 mcg by mouth daily before breakfast.    . lisinopril (PRINIVIL,ZESTRIL) 2.5 MG tablet Take 1 tablet (2.5 mg total) by mouth daily. 30 tablet 3  . LORazepam (ATIVAN) 0.5 MG tablet Take 0.125-0.25 mg by mouth at bedtime as needed for anxiety or sleep.     . Lutein 40 MG CAPS Take 1 capsule by mouth daily.    . polyethylene glycol (MIRALAX / GLYCOLAX) packet Take 17 g by mouth as needed for moderate constipation.      . senna-docusate (SENOKOT-S) 8.6-50 MG tablet Take 1 tablet by mouth as needed for mild constipation.    . Tetrahydrozoline HCl (VISINE OP) Apply 1 drop to eye daily as needed (dry eyes).    Marland Kitchen witch hazel-glycerin (TUCKS) pad Apply topically as needed for itching. 40 each 12   No current facility-administered medications for this visit.     Past Medical History:  Diagnosis Date  . Anemia   . Aortic stenosis    mild per 03-31-17 lov dr Keyira Mondesir epic  . Arthritis   . Chronic diastolic heart failure ([place])    echocardiogram 12/11: EF 03-50%, grade 1 diastolic dysfunction, mild aortic stenosis with a mean gradient of 6 mm of mercury, moderate mitral annular calcification, mild LAE, trivial pericardial effusion;    Myoview 12/11 (during admission for heart failure): No ischemia or scar, EF 63%.  . Colon polyp   . Depression   . Difficulty swallowing    hx of   . Gait abnormality   . Hernia    umbilical and inguinal  . History of alcohol abuse   . History of cellulitis and abscess   . HTN (hypertension)   . Hypothyroidism   . Prostate cancer ([place])  nov 2016 jan 2017   rad tx  . Salmonella age 8  . Thyroid disease     Past Surgical History:  Procedure Laterality Date  . APPENDECTOMY  2012  . COLON SURGERY  05/04/11   Lap assisted right colectomy  . EAR CYST EXCISION Left 07/07/2016   Procedure: BRANCHIAL CLEFT CYST EXCISION;  Surgeon: Izora Gala, MD;  Location: [place];  Service: ENT;  Laterality: Left;  . ESOPHAGOGASTRODUODENOSCOPY (EGD) WITH PROPOFOL N/A 03/16/2017   Procedure: ESOPHAGOGASTRODUODENOSCOPY (EGD) WITH PROPOFOL;  Surgeon: Otis Brace, MD;  [facility];  Service: Gastroenterology;  Laterality: N/A;  . FLEXIBLE SIGMOIDOSCOPY N/A 03/16/2017   Procedure: FLEXIBLE SIGMOIDOSCOPY;  Surgeon: Otis Brace, MD;  [facility];  Service: Gastroenterology;  Laterality: N/A;  . FLEXIBLE SIGMOIDOSCOPY N/A 05/10/2017   Procedure: Flexible Sigmoidoscopy;   Surgeon: Clarene Essex, MD;  [facility];  Service: Endoscopy;  Laterality: N/A;  with RFA  . HERNIA REPAIR     umbilical and inguinal- rt  . PROSTATE BIOPSY    . ROOT CANAL     x several  . skin wart removed      ROS:    As stated in the HPI and negative for all other systems.   PHYSICAL EXAM BP 120/60   Pulse 70   Ht 5\' 9"  (1.753 m)   Wt 180 lb 6.4 oz (81.8 kg)   SpO2 97%   BMI 26.64 kg/m   GENERAL:  Well appearing NECK:  No jugular venous distention, waveform within normal limits, carotid upstroke brisk and symmetric, no bruits, no thyromegaly LUNGS:  Clear to auscultation bilaterally CHEST:  Unremarkable HEART:  PMI not displaced or sustained,S1 and S2 within normal limits, no S3, no S4, no clicks, no rubs, 2 out of 6 apical systolic murmur radiating slightly at the aortic outflow tract, no diastolic murmurs ABD:  Flat, positive bowel sounds normal in frequency in pitch, no bruits, no rebound, no guarding, no midline pulsatile mass, no hepatomegaly, no splenomegaly EXT:  2 plus pulses throughout, no edema, no cyanosis no clubbing        ASSESSMENT AND PLAN  DIASTOLIC HF:   He seems to be euvolemic.  No change in therapy is indicated.  AS/AI:  This was mild and no change in therapy is indicated.  No further imaging is needed at this time.   HTN:   The blood pressure is at target. No change in medications is indicated. We will continue with therapeutic lifestyle changes (TLC).  I will check a BMET.    CAROTID DOPPLER: He had moderate plaque 2 years ago and needs follow-up carotid Dopplers.  AAA SCREENING: He is a male smoker past and so will have screening for abdominal aneurysm.

## 2017-06-07 ENCOUNTER — Encounter: Payer: Self-pay | Admitting: Cardiology

## 2017-06-07 ENCOUNTER — Ambulatory Visit: Payer: Medicare Other | Admitting: Cardiology

## 2017-06-07 VITALS — BP 120/60 | HR 70 | Ht 69.0 in | Wt 180.4 lb

## 2017-06-07 DIAGNOSIS — Z79899 Other long term (current) drug therapy: Secondary | ICD-10-CM | POA: Diagnosis not present

## 2017-06-07 DIAGNOSIS — I350 Nonrheumatic aortic (valve) stenosis: Secondary | ICD-10-CM | POA: Diagnosis not present

## 2017-06-07 DIAGNOSIS — I5032 Chronic diastolic (congestive) heart failure: Secondary | ICD-10-CM | POA: Diagnosis not present

## 2017-06-07 DIAGNOSIS — I779 Disorder of arteries and arterioles, unspecified: Secondary | ICD-10-CM

## 2017-06-07 DIAGNOSIS — Z136 Encounter for screening for cardiovascular disorders: Secondary | ICD-10-CM | POA: Diagnosis not present

## 2017-06-07 DIAGNOSIS — I739 Peripheral vascular disease, unspecified: Principal | ICD-10-CM

## 2017-06-07 HISTORY — DX: Encounter for screening for cardiovascular disorders: Z13.6

## 2017-06-07 NOTE — Patient Instructions (Addendum)
Medication Instructions:  Continue current medications  If you need a refill on your cardiac medications before your next appointment, please call your pharmacy.  Labwork: BMP  Testing/Procedures: Your physician has requested that you have a carotid duplex. This test is an ultrasound of the carotid arteries in your neck. It looks at blood flow through these arteries that supply the brain with blood. Allow one hour for this exam. There are no restrictions or special instructions.  Your physician has requested that you have an abdominal aorta duplex. During this test, an ultrasound is used to evaluate the aorta. Allow 30 minutes for this exam. Do not eat after midnight the day before and avoid carbonated beverages   Follow-Up: Your physician wants you to follow-up in: 1 Year. You should receive a reminder letter in the mail two months in advance. If you do not receive a letter, please call our office [phone].    Thank you for choosing [facility] at [facility]!!

## 2017-06-14 DIAGNOSIS — K627 Radiation proctitis: Secondary | ICD-10-CM | POA: Diagnosis not present

## 2017-06-30 ENCOUNTER — Other Ambulatory Visit (HOSPITAL_COMMUNITY): Payer: Medicare Other

## 2017-07-07 ENCOUNTER — Other Ambulatory Visit: Payer: Medicare Other

## 2017-07-07 ENCOUNTER — Ambulatory Visit (HOSPITAL_COMMUNITY)
Admission: RE | Admit: 2017-07-07 | Discharge: 2017-07-07 | Disposition: A | Discharge status: Home / Self Care | Payer: Medicare Other | Source: Ambulatory Visit | Attending: Cardiology | Admitting: Cardiology

## 2017-07-07 ENCOUNTER — Ambulatory Visit (HOSPITAL_BASED_OUTPATIENT_CLINIC_OR_DEPARTMENT_OTHER)
Admission: RE | Admit: 2017-07-07 | Discharge: 2017-07-07 | Disposition: A | Discharge status: Home / Self Care | Payer: Medicare Other | Source: Ambulatory Visit | Attending: Cardiology | Admitting: Cardiology

## 2017-07-07 DIAGNOSIS — Z87891 Personal history of nicotine dependence: Secondary | ICD-10-CM | POA: Diagnosis not present

## 2017-07-07 DIAGNOSIS — Z136 Encounter for screening for cardiovascular disorders: Secondary | ICD-10-CM | POA: Diagnosis not present

## 2017-07-07 DIAGNOSIS — E785 Hyperlipidemia, unspecified: Secondary | ICD-10-CM | POA: Insufficient documentation

## 2017-07-07 DIAGNOSIS — I6523 Occlusion and stenosis of bilateral carotid arteries: Secondary | ICD-10-CM | POA: Diagnosis not present

## 2017-07-07 DIAGNOSIS — I10 Essential (primary) hypertension: Secondary | ICD-10-CM | POA: Insufficient documentation

## 2017-07-07 DIAGNOSIS — I739 Peripheral vascular disease, unspecified: Principal | ICD-10-CM

## 2017-07-07 DIAGNOSIS — I779 Disorder of arteries and arterioles, unspecified: Secondary | ICD-10-CM

## 2017-07-13 ENCOUNTER — Telehealth: Payer: Self-pay | Admitting: Cardiology

## 2017-07-13 NOTE — Telephone Encounter (Signed)
Pt aware of result.

## 2017-07-13 NOTE — Telephone Encounter (Signed)
New Message  Pt wife returning call about results. Please call back to discuss

## 2017-08-01 ENCOUNTER — Ambulatory Visit: Payer: Medicare Other | Admitting: Hematology

## 2017-08-01 ENCOUNTER — Other Ambulatory Visit: Payer: Medicare Other

## 2017-08-02 ENCOUNTER — Other Ambulatory Visit: Payer: Medicare Other

## 2017-08-02 ENCOUNTER — Telehealth: Payer: Self-pay | Admitting: *Deleted

## 2017-08-02 NOTE — Telephone Encounter (Signed)
Received call from wife Leda Gauze wanting to know about missed appts.  Spoke with Leda Gauze, and was informed that pt never received call about appts for 08/01/17.   Pt had lab appt scheduled for today 1/22, which wife cancelled due to confusion of what is going on. Pt has appt with PCP Dr. Maudie Mercury in 2 weeks.   Zebedee Iba to ask if PCP office could draw  CBC, Diff, Retic, Ferritin, Iron and TIBC.  Josem Kaufmann direct fax number to nurses' desk for results to be faxed to our office for Dr. Burr Medico to review. Instructed pt to keep f/u appt in April with Dr. Burr Medico as scheduled.  Leda Gauze voiced understanding. Pt's   Phone     [phone].

## 2017-08-09 DIAGNOSIS — D638 Anemia in other chronic diseases classified elsewhere: Secondary | ICD-10-CM | POA: Diagnosis not present

## 2017-08-09 DIAGNOSIS — R5383 Other fatigue: Secondary | ICD-10-CM | POA: Diagnosis not present

## 2017-08-09 DIAGNOSIS — I10 Essential (primary) hypertension: Secondary | ICD-10-CM | POA: Diagnosis not present

## 2017-08-09 DIAGNOSIS — E039 Hypothyroidism, unspecified: Secondary | ICD-10-CM | POA: Diagnosis not present

## 2017-08-09 DIAGNOSIS — C61 Malignant neoplasm of prostate: Secondary | ICD-10-CM | POA: Diagnosis not present

## 2017-08-09 DIAGNOSIS — Z5181 Encounter for therapeutic drug level monitoring: Secondary | ICD-10-CM | POA: Diagnosis not present

## 2017-08-09 DIAGNOSIS — E559 Vitamin D deficiency, unspecified: Secondary | ICD-10-CM | POA: Diagnosis not present

## 2017-08-10 ENCOUNTER — Telehealth: Payer: Self-pay | Admitting: *Deleted

## 2017-08-10 NOTE — Telephone Encounter (Signed)
Pt's wife called today & states there was a scheduling mix-up & they didn't know about appt on Monday.  She states that pt had labs done at [facility] & these labs should be faxed over to [place].

## 2017-08-16 DIAGNOSIS — C61 Malignant neoplasm of prostate: Secondary | ICD-10-CM | POA: Diagnosis not present

## 2017-08-16 DIAGNOSIS — Z0000 Encounter for general adult medical examination without abnormal findings: Secondary | ICD-10-CM | POA: Diagnosis not present

## 2017-08-16 DIAGNOSIS — E559 Vitamin D deficiency, unspecified: Secondary | ICD-10-CM | POA: Diagnosis not present

## 2017-08-16 DIAGNOSIS — E039 Hypothyroidism, unspecified: Secondary | ICD-10-CM | POA: Diagnosis not present

## 2017-08-17 ENCOUNTER — Telehealth: Payer: Self-pay | Admitting: *Deleted

## 2017-08-17 NOTE — Telephone Encounter (Signed)
Received call from wife Leda Gauze wanting to inform Dr. Burr Medico re:  Pt had labs done at PCP Dr. Maudie Mercury on 08/10/17.   Pt was instructed by PCP to stop taking Ferrous Sulfate due to increased Iron level.   Pt had stopped Iron po since yesterday  08/16/17. Spoke with Leda Gauze, and informed her that Dr. Burr Medico will review lab results on Monday 2/11.   Leda Gauze understood that nurse will contact her  with further instructions from md. Received faxed lab results from PCP office.   Left results on Dr. Ernestina Penna desk for review. Marilyn's    Phone      [phone].

## 2017-08-24 NOTE — Telephone Encounter (Signed)
OK to stop iron. Will repeat his CBC and iron level on his next visit with [place] oin April. Thanks.   Truitt Merle MD

## 2017-08-24 NOTE — Telephone Encounter (Signed)
Spoke with both pt and wife and informed them of Dr. Ernestina Penna instructions below.  Pt expressed concerns for constipation since being off iron.   Gave pt suggestions for dietary intake, along with stool softner to help with constipation problem.  Stated yesterday was the only day he did not have BMs.  Pt also understood to increase po fluids as tolerated.

## 2017-09-12 ENCOUNTER — Other Ambulatory Visit: Payer: Self-pay | Admitting: *Deleted

## 2017-09-12 MED ORDER — LISINOPRIL 2.5 MG PO TABS: 2.5000 mg | ORAL_TABLET | Freq: Every day | ORAL | 9 refills | Status: DC

## 2017-10-10 ENCOUNTER — Other Ambulatory Visit: Payer: Self-pay | Admitting: Cardiology

## 2017-10-10 NOTE — Telephone Encounter (Signed)
REFILL 

## 2017-10-14 ENCOUNTER — Other Ambulatory Visit: Payer: Self-pay | Admitting: Cardiology

## 2017-10-30 NOTE — Progress Notes (Signed)
[place]  [phone] Fax:([phone]) [phone]  Clinic Follow up Note   Patient Care Team: Jani Gravel, MD as PCP - General (Internal Medicine) 11/01/2017  Diagnosis: Anemia, Iron deficiency  CURRENT THERAPY: IV Feraheme 03/10/17 and 03/16/17, aranesp 03/12/17 during hospitalization; s/p RFA of radiation proctitis via flex sigmoidoscopy per Dr. Watt Climes on 05/10/17.   INTERVAL HISTORY: Scott Newman returns for follow up as scheduled. Last seen 04/28/17. He has been compliant with oral iron replacement. He had normal labs at PCP Dr. Julianne Rice office in 07/2017 and was instructed to discontinue oral iron. Has been eating calf liver and other high-iron foods to supplement. He denies further bleeding. He gained weight during winter months. Energy level is slow to recover. He did not follow through with PT. Has good appetite. Regular BM. Has weak urine stream at baseline, no hematuria or dysuria. His sleep schedule is off, not sleeping well at night.   REVIEW OF SYSTEMS:   Constitutional: Denies fevers, chills, night sweats (+) good appetite (+) low energy  (+) weight gain  Eyes: Denies blurriness of vision Ears, nose, mouth, throat, and face: Denies mucositis or sore throat (+) congestion, allergies  Respiratory: Denies cough, dyspnea or wheezes Cardiovascular: Denies palpitation, chest discomfort or lower extremity swelling Gastrointestinal:  Denies nausea, vomiting, constipation, diarrhea, hematochezia, heartburn or change in bowel habits Skin: Denies abnormal skin rashes Lymphatics: Denies new lymphadenopathy or easy bruising Neurological:Denies numbness, tingling or new weaknesses  Behavioral/Psych: Mood is stable, no new changes (+) altered sleep cycle  All other systems were reviewed with the patient and are negative.  MEDICAL HISTORY:  Past Medical History:  Diagnosis Date  . Anemia   . Aortic stenosis    mild per 03-31-17 lov dr hochrein epic  . Arthritis   .  Chronic diastolic heart failure ([place])    echocardiogram 12/11: EF 45-40%, grade 1 diastolic dysfunction, mild aortic stenosis with a mean gradient of 6 mm of mercury, moderate mitral annular calcification, mild LAE, trivial pericardial effusion;    Myoview 12/11 (during admission for heart failure): No ischemia or scar, EF 63%.  . Colon polyp   . Depression   . Difficulty swallowing    hx of   . Gait abnormality   . Hernia    umbilical and inguinal  . History of alcohol abuse   . History of cellulitis and abscess   . HTN (hypertension)   . Hypothyroidism   . Prostate cancer [facility]) nov 2016 jan 2017   rad tx  . Salmonella age 64  . Thyroid disease     SURGICAL HISTORY: Past Surgical History:  Procedure Laterality Date  . APPENDECTOMY  2012  . COLON SURGERY  05/04/11   Lap assisted right colectomy  . EAR CYST EXCISION Left 07/07/2016   Procedure: BRANCHIAL CLEFT CYST EXCISION;  Surgeon: Izora Gala, MD;  Location: [place];  Service: ENT;  Laterality: Left;  . ESOPHAGOGASTRODUODENOSCOPY (EGD) WITH PROPOFOL N/A 03/16/2017   Procedure: ESOPHAGOGASTRODUODENOSCOPY (EGD) WITH PROPOFOL;  Surgeon: Otis Brace, MD;  [facility];  Service: Gastroenterology;  Laterality: N/A;  . FLEXIBLE SIGMOIDOSCOPY N/A 03/16/2017   Procedure: FLEXIBLE SIGMOIDOSCOPY;  Surgeon: Otis Brace, MD;  [facility];  Service: Gastroenterology;  Laterality: N/A;  . FLEXIBLE SIGMOIDOSCOPY N/A 05/10/2017   Procedure: Flexible Sigmoidoscopy;  Surgeon: Clarene Essex, MD;  [facility];  Service: Endoscopy;  Laterality: N/A;  with RFA  . HERNIA REPAIR     umbilical and inguinal- rt  . PROSTATE  BIOPSY    . ROOT CANAL     x several  . skin wart removed      I have reviewed the social history and family history with the patient and they are unchanged from previous note.  ALLERGIES:  is allergic to other and acetaminophen.  MEDICATIONS:  Current Outpatient Medications  Medication  Sig Dispense Refill  . b complex vitamins tablet Take 1 tablet by mouth daily.    . Cholecalciferol (VITAMIN D) 2000 units CAPS Take 2,000 Units by mouth every evening.     . Coenzyme Q10 (COQ10 PO) Take 100 mg by mouth daily.     . folic acid (FOLVITE) 1 MG tablet Take 1 tablet (1 mg total) by mouth daily. 30 tablet 0  . folic acid (FOLVITE) 419 MCG tablet Take 1,200 mcg by mouth daily.    Marland Kitchen lisinopril (PRINIVIL,ZESTRIL) 2.5 MG tablet Take 1 tablet (2.5 mg total) by mouth daily. 30 tablet 9  . LORazepam (ATIVAN) 0.5 MG tablet Take 0.125-0.25 mg by mouth at bedtime as needed for anxiety or sleep.     . Lutein 40 MG CAPS Take 1 capsule by mouth daily.    Marland Kitchen SYNTHROID 50 MCG tablet TAKE 1 TABLET BY MOUTH EVERY DAY 30 tablet 11  . Tetrahydrozoline HCl (VISINE OP) Apply 1 drop to eye daily as needed (dry eyes).    Marland Kitchen witch hazel-glycerin (TUCKS) pad Apply topically as needed for itching. 40 each 12  . polyethylene glycol (MIRALAX / GLYCOLAX) packet Take 17 g by mouth as needed for moderate constipation.    . senna-docusate (SENOKOT-S) 8.6-50 MG tablet Take 1 tablet by mouth as needed for mild constipation.     No current facility-administered medications for this visit.     PHYSICAL EXAMINATION: ECOG PERFORMANCE STATUS: 2 - Symptomatic, <50% confined to bed  Vitals:   11/01/17 1601  BP: (!) 129/53  Pulse: 72  Resp: 18  Temp: 97.6 F (36.4 C)  SpO2: 98%   Filed Weights   11/01/17 1601  Weight: 203 lb 6.4 oz (92.3 kg)    GENERAL:alert, no distress and comfortable SKIN: skin color, texture, turgor are normal, no rashes or significant lesions EYES: normal, Conjunctiva are pink and non-injected, sclera clear OROPHARYNX:no exudate, no erythema and lips, buccal mucosa, and tongue normal  LYMPH:  no palpable cervical or supraclavicular lymphadenopathy. Asymmetric soft tissue fullness right > left neck, no discrete nodes  LUNGS: clear to auscultation with normal breathing effort HEART:  regular rate & rhythm and no murmurs and no lower extremity edema ABDOMEN:abdomen soft, non-tender and normal bowel sounds. No hepatomegaly  Musculoskeletal:no cyanosis of digits and no clubbing  NEURO: alert & oriented x 3 with fluent speech, no focal motor/sensory deficits  LABORATORY DATA:  I have reviewed the data as listed CBC Latest Ref Rng & Units 11/01/2017 04/28/2017 03/18/2017  WBC 4.0 - 10.3 K/uL 6.5 5.1 9.4  Hemoglobin 13.0 - 17.1 g/dL 15.8 14.0 6.8(LL)  Hematocrit 38.4 - 49.9 % 55.3(H) 41.3 22.8(L)  Platelets 140 - 400 K/uL 214 219 246     CMP Latest Ref Rng & Units 03/17/2017 03/13/2017 03/12/2017  Glucose 65 - 99 mg/dL 132(H) 77 86  BUN 6 - 20 mg/dL 9 12 11   Creatinine 0.61 - 1.24 mg/dL 1.05 1.10 1.12  Sodium 135 - 145 mmol/L 132(L) 134(L) 134(L)  Potassium 3.5 - 5.1 mmol/L 3.8 4.0 3.9  Chloride 101 - 111 mmol/L 103 105 106  CO2 22 - 32 mmol/L 20(L)  20(L) 22  Calcium 8.9 - 10.3 mg/dL 8.7(L) 9.1 8.9  Total Protein 6.5 - 8.1 g/dL - - -  Total Bilirubin 0.3 - 1.2 mg/dL - - -  Alkaline Phos 38 - 126 U/L - - -  AST 15 - 41 U/L - - -  ALT 17 - 63 U/L - - -      RADIOGRAPHIC STUDIES: I have personally reviewed the radiological images as listed and agreed with the findings in the report. No results found.   ASSESSMENT & PLAN: 72 y.o.male with PMH Hypertension, hypothyroidism, prostate cancer and chronic diastolic CHF, admitted to hospital for GI bleeding, anemia, worsening shortness of breath and weakness. Intermittent melena since April 2018. He was on high dose of aspirin for arthritis, which she stopped this GI bleeding. 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   1. Iron deficient anemia, secondary to GI bleeding  2. Acute GI bleeding with melena, acute blood lossanemia, secondary to radiation proctitis s/p APC and RFA per GI 3. Chronic diastolic CHF 4. HTN  5. Jehovah witness  Mr. Gehring appears stable. He was treated for radiation proctitis with  RFA per Dr. Watt Climes which was identified as the source of his GI bleeding and iron deficiency anemia. S/p aranesp and IV Feraheme in 02/2017 and 03/2017. Hgb and iron studies responded well. He had normal labs in 07/2017, oral iron therapy was discontinued at that time. Hgb has remained normal since then. He does have elevated hematocrit and other abnormal indices on today's labs, could be rebound from iron therapy. MCV 124. He is on folic acid supplement. Will see if lab can add on U76 and folic acid, if not I may recommend he get that done with PCP. Iron studies are pending. I reviewed with Dr. Burr Medico. Recommend he stay off iron therapy and reduce oral iron in his diet. Given his cardiac co-morbidities, we recommend baby aspirin daily to reduce risk of thrombus formation. I urged him to discontinue if he notices any bleeding. He plans to f/u with Dr. Maudie Mercury in 03/2018, I recommend he get CBC and Iron studies checked at PCP in 3 months. We will see him for f/u in 6-12 months or sooner if new needs arise.   PLAN: -Remain off oral iron therapy, reduce iron in diet -Aspirin 81 mg daily  -CBC, Iron and TIBC, ferritin at PCP in 3 months -F/u with PCP, cardiology -Lab, f/u with Dr. Burr Medico in 6-12 months  -Add on L46, folic acid   All questions were answered. The patient knows to call the clinic with any problems, questions or concerns. No barriers to learning was detected. I spent 20 minutes counseling the patient face to face. The total time spent in the appointment was 25 minutes and more than 50% was on counseling and review of test results     Alla Feeling, NP 11/01/17

## 2017-11-01 ENCOUNTER — Encounter: Payer: Self-pay | Admitting: Nurse Practitioner

## 2017-11-01 ENCOUNTER — Inpatient Hospital Stay: Payer: Medicare Other

## 2017-11-01 ENCOUNTER — Inpatient Hospital Stay: Payer: Medicare Other | Attending: Nurse Practitioner | Admitting: Nurse Practitioner

## 2017-11-01 VITALS — BP 129/53 | HR 72 | Temp 97.6°F | Resp 18 | Ht 69.0 in | Wt 203.4 lb

## 2017-11-01 DIAGNOSIS — K922 Gastrointestinal hemorrhage, unspecified: Secondary | ICD-10-CM | POA: Diagnosis not present

## 2017-11-01 DIAGNOSIS — I110 Hypertensive heart disease with heart failure: Secondary | ICD-10-CM | POA: Diagnosis not present

## 2017-11-01 DIAGNOSIS — I10 Essential (primary) hypertension: Secondary | ICD-10-CM | POA: Diagnosis not present

## 2017-11-01 DIAGNOSIS — Z7982 Long term (current) use of aspirin: Secondary | ICD-10-CM | POA: Insufficient documentation

## 2017-11-01 DIAGNOSIS — D500 Iron deficiency anemia secondary to blood loss (chronic): Secondary | ICD-10-CM

## 2017-11-01 DIAGNOSIS — I5032 Chronic diastolic (congestive) heart failure: Secondary | ICD-10-CM | POA: Insufficient documentation

## 2017-11-01 LAB — CBC WITH DIFFERENTIAL/PLATELET
BASOS PCT: 1 %
Basophils Absolute: 0 10*3/uL (ref 0.0–0.1)
EOS ABS: 0.2 10*3/uL (ref 0.0–0.5)
EOS PCT: 2 %
HCT: 55.3 % — ABNORMAL HIGH (ref 38.4–49.9)
Hemoglobin: 15.8 g/dL (ref 13.0–17.1)
LYMPHS ABS: 0.8 10*3/uL — AB (ref 0.9–3.3)
Lymphocytes Relative: 12 %
MCH: 35.7 pg — AB (ref 27.2–33.4)
MCHC: 28.6 g/dL — ABNORMAL LOW (ref 32.0–36.0)
MCV: 124.8 fL — ABNORMAL HIGH (ref 79.3–98.0)
MONO ABS: 0.7 10*3/uL (ref 0.1–0.9)
MONOS PCT: 12 %
Neutro Abs: 4.7 10*3/uL (ref 1.5–6.5)
Neutrophils Relative %: 73 %
PLATELETS: 214 10*3/uL (ref 140–400)
RBC: 4.43 MIL/uL (ref 4.20–5.82)
RDW: 12.4 % (ref 11.0–14.6)
WBC: 6.5 10*3/uL (ref 4.0–10.3)

## 2017-11-01 LAB — RETICULOCYTES
RBC.: 4.43 MIL/uL (ref 4.20–5.82)
RETIC CT PCT: 2.4 % — AB (ref 0.8–1.8)
Retic Count, Absolute: 106.3 10*3/uL — ABNORMAL HIGH (ref 34.8–93.9)

## 2017-11-02 ENCOUNTER — Other Ambulatory Visit: Payer: Self-pay

## 2017-11-02 ENCOUNTER — Telehealth: Payer: Self-pay

## 2017-11-02 ENCOUNTER — Other Ambulatory Visit: Payer: Self-pay | Admitting: *Deleted

## 2017-11-02 DIAGNOSIS — D500 Iron deficiency anemia secondary to blood loss (chronic): Secondary | ICD-10-CM

## 2017-11-02 DIAGNOSIS — D509 Iron deficiency anemia, unspecified: Secondary | ICD-10-CM

## 2017-11-02 LAB — IRON AND TIBC
IRON: 159 ug/dL (ref 42–163)
Saturation Ratios: 51 % (ref 42–163)
TIBC: 315 ug/dL (ref 202–409)
UIBC: 156 ug/dL

## 2017-11-02 LAB — FOLATE: Folate: 34 ng/mL (ref >5.9)

## 2017-11-02 LAB — FERRITIN: Ferritin: 53 ng/mL (ref 22–316)

## 2017-11-02 LAB — VITAMIN B12: Vitamin B-12: 336 pg/mL (ref 180–914)

## 2017-11-02 NOTE — Telephone Encounter (Signed)
Duplicate

## 2018-02-14 DIAGNOSIS — E559 Vitamin D deficiency, unspecified: Secondary | ICD-10-CM | POA: Diagnosis not present

## 2018-02-14 DIAGNOSIS — D649 Anemia, unspecified: Secondary | ICD-10-CM | POA: Diagnosis not present

## 2018-02-14 DIAGNOSIS — E039 Hypothyroidism, unspecified: Secondary | ICD-10-CM | POA: Diagnosis not present

## 2018-02-14 DIAGNOSIS — Z0000 Encounter for general adult medical examination without abnormal findings: Secondary | ICD-10-CM | POA: Diagnosis not present

## 2018-02-14 DIAGNOSIS — C61 Malignant neoplasm of prostate: Secondary | ICD-10-CM | POA: Diagnosis not present

## 2018-02-21 DIAGNOSIS — D509 Iron deficiency anemia, unspecified: Secondary | ICD-10-CM | POA: Diagnosis not present

## 2018-02-21 DIAGNOSIS — R7301 Impaired fasting glucose: Secondary | ICD-10-CM | POA: Diagnosis not present

## 2018-02-21 DIAGNOSIS — I350 Nonrheumatic aortic (valve) stenosis: Secondary | ICD-10-CM | POA: Diagnosis not present

## 2018-02-21 DIAGNOSIS — I10 Essential (primary) hypertension: Secondary | ICD-10-CM | POA: Diagnosis not present

## 2018-02-21 DIAGNOSIS — E559 Vitamin D deficiency, unspecified: Secondary | ICD-10-CM | POA: Diagnosis not present

## 2018-02-21 DIAGNOSIS — E039 Hypothyroidism, unspecified: Secondary | ICD-10-CM | POA: Diagnosis not present

## 2018-05-30 DIAGNOSIS — R1314 Dysphagia, pharyngoesophageal phase: Secondary | ICD-10-CM | POA: Diagnosis not present

## 2018-05-31 ENCOUNTER — Telehealth: Payer: Self-pay

## 2018-05-31 ENCOUNTER — Other Ambulatory Visit: Payer: Self-pay | Admitting: Otolaryngology

## 2018-05-31 ENCOUNTER — Inpatient Hospital Stay: Payer: Medicare Other

## 2018-05-31 ENCOUNTER — Inpatient Hospital Stay: Payer: Medicare Other | Admitting: Hematology

## 2018-05-31 DIAGNOSIS — R1314 Dysphagia, pharyngoesophageal phase: Secondary | ICD-10-CM

## 2018-05-31 NOTE — Telephone Encounter (Signed)
Patient's wife Leda Gauze called stating that she had called a couple of days ago to cancel his appointment for today.  He is being followed by his PCP and his lab work has been good.  When they saw Dr. Burr Medico that patient had been taking Vitamin K.

## 2018-06-06 ENCOUNTER — Ambulatory Visit
Admission: RE | Admit: 2018-06-06 | Discharge: 2018-06-06 | Disposition: A | Discharge status: Home / Self Care | Payer: Medicare Other | Source: Ambulatory Visit | Attending: Otolaryngology | Admitting: Otolaryngology

## 2018-06-06 DIAGNOSIS — R1314 Dysphagia, pharyngoesophageal phase: Secondary | ICD-10-CM

## 2018-06-06 DIAGNOSIS — K449 Diaphragmatic hernia without obstruction or gangrene: Secondary | ICD-10-CM | POA: Diagnosis not present

## 2018-06-13 ENCOUNTER — Other Ambulatory Visit: Payer: Self-pay | Admitting: Cardiology

## 2018-06-20 NOTE — H&P (Signed)
Scott Newman is an 72 y.o. male.   Chief Complaint: dysphagia HPI: trouble swallowing especially solids. BA swallow reveals upper esophageal stricture.  Past Medical History:  Diagnosis Date  . Anemia   . Aortic stenosis    mild per 03-31-17 lov dr hochrein epic  . Arthritis   . Chronic diastolic heart failure ([place])    echocardiogram 12/11: EF 02-72%, grade 1 diastolic dysfunction, mild aortic stenosis with a mean gradient of 6 mm of mercury, moderate mitral annular calcification, mild LAE, trivial pericardial effusion;    Myoview 12/11 (during admission for heart failure): No ischemia or scar, EF 63%.  . Colon polyp   . Depression   . Difficulty swallowing    hx of   . Gait abnormality   . Hernia    umbilical and inguinal  . History of alcohol abuse   . History of cellulitis and abscess   . HTN (hypertension)   . Hypothyroidism   . Prostate cancer [facility]) nov 2016 jan 2017   rad tx  . Salmonella age 38  . Thyroid disease     Past Surgical History:  Procedure Laterality Date  . APPENDECTOMY  2012  . COLON SURGERY  05/04/11   Lap assisted right colectomy  . EAR CYST EXCISION Left 07/07/2016   Procedure: BRANCHIAL CLEFT CYST EXCISION;  Surgeon: Izora Gala, MD;  Location: [place];  Service: ENT;  Laterality: Left;  . ESOPHAGOGASTRODUODENOSCOPY (EGD) WITH PROPOFOL N/A 03/16/2017   Procedure: ESOPHAGOGASTRODUODENOSCOPY (EGD) WITH PROPOFOL;  Surgeon: Otis Brace, MD;  [facility];  Service: Gastroenterology;  Laterality: N/A;  . FLEXIBLE SIGMOIDOSCOPY N/A 03/16/2017   Procedure: FLEXIBLE SIGMOIDOSCOPY;  Surgeon: Otis Brace, MD;  [facility];  Service: Gastroenterology;  Laterality: N/A;  . FLEXIBLE SIGMOIDOSCOPY N/A 05/10/2017   Procedure: Flexible Sigmoidoscopy;  Surgeon: Clarene Essex, MD;  [facility];  Service: Endoscopy;  Laterality: N/A;  with RFA  . HERNIA REPAIR     umbilical and inguinal- rt  . PROSTATE BIOPSY    . ROOT CANAL      x several  . skin wart removed      Family History  Problem Relation Age of Onset  . Heart disease Father   . Cancer Paternal Aunt        breast  . Cancer Paternal Uncle        prostate   Social History:  reports that he quit smoking about 53 years ago. He has never used smokeless tobacco. He reports that he drinks alcohol. He reports that he does not use drugs.  Allergies:  Allergies  Allergen Reactions  . Other     No blood products   . Acetaminophen Other (See Comments)    DOESN'T MAKE HIM FEEL WELL     No medications prior to admission.    No results found for this or any previous visit (from the past 48 hour(s)). No results found.  ROS: otherwise negative  There were no vitals taken for this visit.  PHYSICAL EXAM: Overall appearance:  Healthy appearing, in no distress Head:  Normocephalic, atraumatic. Ears: External auditory canals are clear; tympanic membranes are intact and the middle ears are free of any effusion. Nose: External nose is healthy in appearance. Internal nasal exam free of any lesions or obstruction. Oral Cavity/pharynx:  There are no mucosal lesions or masses identified. Hypopharynx/Larynx: no signs of any mucosal lesions or masses identified. Vocal cords move normally. Neuro:  No identifiable neurologic deficits. Neck: No palpable  neck masses.  Studies Reviewed: BA swallow    Assessment/Plan Proceed with esophagoscopy with dilitation.  Izora Gala 06/20/2018, 3:06 PM

## 2018-06-23 NOTE — Progress Notes (Signed)
Several unsuccessful attempts have been made to contact pt. Left a voice message on pt home phone mailbox with pre-op instructions according to checklist. Please complete assessment on DOS. Pt made aware to stop taking Aspirin (unless advised otherwise by your surgeon), vitamins, fish oil, CO Q 10, Chromium Picolate, Lutein and herbal medications. Do not take any NSAIDs ie: Ibuprofen, Advil, Naproxen (Aleve), Motrin, BC and Goody Powder.

## 2018-06-26 ENCOUNTER — Encounter (HOSPITAL_COMMUNITY)
Admission: AD | Disposition: A | Discharge status: Home / Self Care | Payer: Self-pay | Source: Home / Self Care | Attending: Otolaryngology

## 2018-06-26 ENCOUNTER — Ambulatory Visit (HOSPITAL_COMMUNITY): Payer: Medicare Other | Admitting: Anesthesiology

## 2018-06-26 ENCOUNTER — Other Ambulatory Visit: Payer: Self-pay

## 2018-06-26 ENCOUNTER — Encounter (HOSPITAL_COMMUNITY): Payer: Self-pay

## 2018-06-26 ENCOUNTER — Ambulatory Visit (HOSPITAL_COMMUNITY)
Admission: AD | Admit: 2018-06-26 | Discharge: 2018-06-26 | Disposition: A | Discharge status: Home / Self Care | Payer: Medicare Other | Attending: Otolaryngology | Admitting: Otolaryngology

## 2018-06-26 DIAGNOSIS — I110 Hypertensive heart disease with heart failure: Secondary | ICD-10-CM | POA: Diagnosis not present

## 2018-06-26 DIAGNOSIS — Z8546 Personal history of malignant neoplasm of prostate: Secondary | ICD-10-CM | POA: Insufficient documentation

## 2018-06-26 DIAGNOSIS — K222 Esophageal obstruction: Secondary | ICD-10-CM | POA: Diagnosis not present

## 2018-06-26 DIAGNOSIS — Z79899 Other long term (current) drug therapy: Secondary | ICD-10-CM | POA: Insufficient documentation

## 2018-06-26 DIAGNOSIS — I5032 Chronic diastolic (congestive) heart failure: Secondary | ICD-10-CM | POA: Diagnosis not present

## 2018-06-26 DIAGNOSIS — Z87891 Personal history of nicotine dependence: Secondary | ICD-10-CM | POA: Insufficient documentation

## 2018-06-26 DIAGNOSIS — R1310 Dysphagia, unspecified: Secondary | ICD-10-CM | POA: Diagnosis not present

## 2018-06-26 DIAGNOSIS — E785 Hyperlipidemia, unspecified: Secondary | ICD-10-CM | POA: Diagnosis not present

## 2018-06-26 DIAGNOSIS — R1314 Dysphagia, pharyngoesophageal phase: Secondary | ICD-10-CM | POA: Diagnosis not present

## 2018-06-26 HISTORY — PX: ESOPHAGOSCOPY WITH DILITATION: SHX5618

## 2018-06-26 LAB — COMPREHENSIVE METABOLIC PANEL
ALT: 45 U/L — ABNORMAL HIGH (ref 0–44)
AST: 57 U/L — ABNORMAL HIGH (ref 15–41)
Albumin: 4.2 g/dL (ref 3.5–5.0)
Alkaline Phosphatase: 53 U/L (ref 38–126)
Anion gap: 12 (ref 5–15)
BUN: 9 mg/dL (ref 8–23)
CO2: 20 mmol/L — AB (ref 22–32)
Calcium: 8.7 mg/dL — ABNORMAL LOW (ref 8.9–10.3)
Chloride: 101 mmol/L (ref 98–111)
Creatinine, Ser: 1.05 mg/dL (ref 0.61–1.24)
GFR calc Af Amer: >60 mL/min (ref >60)
GFR calc non Af Amer: >60 mL/min (ref >60)
Glucose, Bld: 116 mg/dL — ABNORMAL HIGH (ref 70–99)
Potassium: 4.2 mmol/L (ref 3.5–5.1)
SODIUM: 133 mmol/L — AB (ref 135–145)
Total Bilirubin: 1.2 mg/dL (ref 0.3–1.2)
Total Protein: 7.5 g/dL (ref 6.5–8.1)

## 2018-06-26 LAB — CBC
HCT: 46.5 % (ref 39.0–52.0)
Hemoglobin: 15.3 g/dL (ref 13.0–17.0)
MCH: 32.9 pg (ref 26.0–34.0)
MCHC: 32.9 g/dL (ref 30.0–36.0)
MCV: 100 fL (ref 80.0–100.0)
Platelets: 220 10*3/uL (ref 150–400)
RBC: 4.65 MIL/uL (ref 4.22–5.81)
RDW: 13 % (ref 11.5–15.5)
WBC: 5.4 10*3/uL (ref 4.0–10.5)
nRBC: 0 % (ref 0.0–0.2)

## 2018-06-26 SURGERY — ESOPHAGOSCOPY, WITH DILATION
Anesthesia: General | Site: Mouth

## 2018-06-26 MED ORDER — SUCCINYLCHOLINE CHLORIDE 200 MG/10ML IV SOSY
PREFILLED_SYRINGE | INTRAVENOUS | Status: DC | PRN
  Administered 2018-06-26: 120 mg via INTRAVENOUS

## 2018-06-26 MED ORDER — LACTATED RINGERS IV SOLN
INTRAVENOUS | Status: DC
  Administered 2018-06-26: 14:00:00 via INTRAVENOUS

## 2018-06-26 MED ORDER — PHENYLEPHRINE 40 MCG/ML (10ML) SYRINGE FOR IV PUSH (FOR BLOOD PRESSURE SUPPORT)
PREFILLED_SYRINGE | INTRAVENOUS | Status: AC
  Filled 2018-06-26: qty 10

## 2018-06-26 MED ORDER — FENTANYL CITRATE (PF) 100 MCG/2ML IJ SOLN: 25.0000 ug | INTRAMUSCULAR | Status: DC | PRN

## 2018-06-26 MED ORDER — PROPOFOL 10 MG/ML IV BOLUS
INTRAVENOUS | Status: AC
  Filled 2018-06-26: qty 20

## 2018-06-26 MED ORDER — SODIUM CHLORIDE 0.9 % IV SOLN
INTRAVENOUS | Status: DC | PRN
  Administered 2018-06-26: 25 ug/min via INTRAVENOUS

## 2018-06-26 MED ORDER — MEPERIDINE HCL 50 MG/ML IJ SOLN: 6.2500 mg | INTRAMUSCULAR | Status: DC | PRN

## 2018-06-26 MED ORDER — ONDANSETRON HCL 4 MG/2ML IJ SOLN
INTRAMUSCULAR | Status: AC
  Filled 2018-06-26: qty 2

## 2018-06-26 MED ORDER — PHENYLEPHRINE 40 MCG/ML (10ML) SYRINGE FOR IV PUSH (FOR BLOOD PRESSURE SUPPORT)
PREFILLED_SYRINGE | INTRAVENOUS | Status: DC | PRN
  Administered 2018-06-26: 80 ug via INTRAVENOUS

## 2018-06-26 MED ORDER — ROCURONIUM BROMIDE 50 MG/5ML IV SOSY
PREFILLED_SYRINGE | INTRAVENOUS | Status: AC
  Filled 2018-06-26: qty 5

## 2018-06-26 MED ORDER — LIDOCAINE 2% (20 MG/ML) 5 ML SYRINGE
INTRAMUSCULAR | Status: DC | PRN
  Administered 2018-06-26: 60 mg via INTRAVENOUS

## 2018-06-26 MED ORDER — FENTANYL CITRATE (PF) 250 MCG/5ML IJ SOLN
INTRAMUSCULAR | Status: DC | PRN
  Administered 2018-06-26: 100 ug via INTRAVENOUS

## 2018-06-26 MED ORDER — ROCURONIUM BROMIDE 50 MG/5ML IV SOSY
PREFILLED_SYRINGE | INTRAVENOUS | Status: AC
  Filled 2018-06-26: qty 10

## 2018-06-26 MED ORDER — PROPOFOL 10 MG/ML IV BOLUS
INTRAVENOUS | Status: DC | PRN
  Administered 2018-06-26: 120 mg via INTRAVENOUS

## 2018-06-26 MED ORDER — FENTANYL CITRATE (PF) 250 MCG/5ML IJ SOLN
INTRAMUSCULAR | Status: AC
  Filled 2018-06-26: qty 5

## 2018-06-26 MED ORDER — ONDANSETRON HCL 4 MG/2ML IJ SOLN: 4.0000 mg | Freq: Once | INTRAMUSCULAR | Status: DC | PRN

## 2018-06-26 MED ORDER — LIDOCAINE 2% (20 MG/ML) 5 ML SYRINGE
INTRAMUSCULAR | Status: AC
  Filled 2018-06-26: qty 5

## 2018-06-26 MED ORDER — ONDANSETRON HCL 4 MG/2ML IJ SOLN
INTRAMUSCULAR | Status: DC | PRN
  Administered 2018-06-26: 4 mg via INTRAVENOUS

## 2018-06-26 MED ORDER — DEXAMETHASONE SODIUM PHOSPHATE 10 MG/ML IJ SOLN
INTRAMUSCULAR | Status: DC | PRN
  Administered 2018-06-26: 10 mg via INTRAVENOUS

## 2018-06-26 MED ORDER — DEXAMETHASONE SODIUM PHOSPHATE 10 MG/ML IJ SOLN
INTRAMUSCULAR | Status: AC
  Filled 2018-06-26: qty 1

## 2018-06-26 MED ORDER — ARTIFICIAL TEARS OPHTHALMIC OINT
TOPICAL_OINTMENT | OPHTHALMIC | Status: AC
  Filled 2018-06-26: qty 7

## 2018-06-26 MED ORDER — 0.9 % SODIUM CHLORIDE (POUR BTL) OPTIME
TOPICAL | Status: DC | PRN
  Administered 2018-06-26: 1000 mL

## 2018-06-26 SURGICAL SUPPLY — 27 items
BALLN PULM 15 16.5 18 X 75CM (BALLOONS) ×1
BALLN PULM 15 16.5 18X75 (BALLOONS) ×2
BALLOON PULM 15 16.5 18X75 (BALLOONS) ×1 IMPLANT
BLADE SURG 15 STRL LF DISP TIS (BLADE) IMPLANT
BLADE SURG 15 STRL SS (BLADE)
CANISTER SUCT 3000ML PPV (MISCELLANEOUS) ×3 IMPLANT
CONT SPEC 4OZ CLIKSEAL STRL BL (MISCELLANEOUS) IMPLANT
COVER BACK TABLE 60X90IN (DRAPES) ×3 IMPLANT
COVER WAND RF STERILE (DRAPES) ×1 IMPLANT
CRADLE DONUT ADULT HEAD (MISCELLANEOUS) ×2 IMPLANT
DRAPE HALF SHEET 40X57 (DRAPES) ×3 IMPLANT
GAUZE SPONGE 4X4 12PLY STRL (GAUZE/BANDAGES/DRESSINGS) IMPLANT
GLOVE BIO SURGEON STRL SZ7.5 (GLOVE) ×3 IMPLANT
GUARD TEETH (MISCELLANEOUS) ×2 IMPLANT
KIT BASIN OR (CUSTOM PROCEDURE TRAY) ×3 IMPLANT
KIT TURNOVER KIT B (KITS) ×3 IMPLANT
NDL PRECISIONGLIDE 27X1.5 (NEEDLE) IMPLANT
NEEDLE PRECISIONGLIDE 27X1.5 (NEEDLE) IMPLANT
NS IRRIG 1000ML POUR BTL (IV SOLUTION) ×3 IMPLANT
PAD ARMBOARD 7.5X6 YLW CONV (MISCELLANEOUS) ×4 IMPLANT
PATTIES SURGICAL .5 X3 (DISPOSABLE) IMPLANT
SOLUTION ANTI FOG 6CC (MISCELLANEOUS) IMPLANT
SURGILUBE 2OZ TUBE FLIPTOP (MISCELLANEOUS) ×3 IMPLANT
TOWEL OR 17X24 6PK STRL BLUE (TOWEL DISPOSABLE) ×4 IMPLANT
TUBE CONNECTING 12'X1/4 (SUCTIONS) ×1
TUBE CONNECTING 12X1/4 (SUCTIONS) ×2 IMPLANT
WATER STERILE IRR 1000ML POUR (IV SOLUTION) ×1 IMPLANT

## 2018-06-26 NOTE — Anesthesia Postprocedure Evaluation (Signed)
Anesthesia Post Note  Patient: Scott Newman  Procedure(s) Performed: ESOPHAGOSCOPY WITH DILITATION (N/A Mouth)     Patient location during evaluation: PACU Anesthesia Type: General Level of consciousness: awake and alert and oriented Pain management: pain level controlled Vital Signs Assessment: post-procedure vital signs reviewed and stable Respiratory status: spontaneous breathing, nonlabored ventilation and respiratory function stable Cardiovascular status: blood pressure returned to baseline and stable Postop Assessment: no apparent nausea or vomiting Anesthetic complications: no    Last Vitals:  Vitals:   06/26/18 1457 06/26/18 1505  BP: 138/89 (!) 151/77  Pulse: 72 74  Resp: 17 18  Temp: (!) 36.3 C   SpO2: 96% 96%    Last Pain:  Vitals:   06/26/18 1442  TempSrc:   PainSc: 0-No pain                 Finnian Husted A.

## 2018-06-26 NOTE — Anesthesia Preprocedure Evaluation (Signed)
Anesthesia Evaluation  Patient identified by MRN, date of birth, ID band Patient awake    Reviewed: Allergy & Precautions, NPO status , Patient's Chart, lab work & pertinent test results, reviewed documented beta blocker date and time   Airway Mallampati: II  TM Distance: >3 FB Neck ROM: Full    Dental no notable dental hx. (+) Teeth Intact   Pulmonary former smoker,    Pulmonary exam normal breath sounds clear to auscultation       Cardiovascular hypertension, Pt. on medications Normal cardiovascular exam Rhythm:Regular Rate:Normal     Neuro/Psych PSYCHIATRIC DISORDERS Depression negative neurological ROS     GI/Hepatic negative GI ROS, Neg liver ROS,   Endo/Other  Hypothyroidism   Renal/GU negative Renal ROS  negative genitourinary   Musculoskeletal  (+) Arthritis , Osteoarthritis,    Abdominal (+) + obese,   Peds  Hematology  (+) anemia ,   Anesthesia Other Findings   Reproductive/Obstetrics                             Anesthesia Physical Anesthesia Plan  ASA: III  Anesthesia Plan: General   Post-op Pain Management:    Induction: Intravenous  PONV Risk Score and Plan: 3 and Ondansetron, Dexamethasone and Treatment may vary due to age or medical condition  Airway Management Planned: Oral ETT  Additional Equipment:   Intra-op Plan:   Post-operative Plan: Extubation in OR  Informed Consent: I have reviewed the patients History and Physical, chart, labs and discussed the procedure including the risks, benefits and alternatives for the proposed anesthesia with the patient or authorized representative who has indicated his/her understanding and acceptance.   Dental advisory given  Plan Discussed with: CRNA and Surgeon  Anesthesia Plan Comments:         Anesthesia Quick Evaluation

## 2018-06-26 NOTE — Discharge Instructions (Signed)
Advance diet as tolerated

## 2018-06-26 NOTE — Transfer of Care (Signed)
Immediate Anesthesia Transfer of Care Note  Patient: Scott Newman  Procedure(s) Performed: ESOPHAGOSCOPY WITH DILITATION (N/A Mouth)  Patient Location: PACU  Anesthesia Type:General  Level of Consciousness: awake, alert , drowsy and patient cooperative  Airway & Oxygen Therapy: Patient Spontanous Breathing and Patient connected to face mask oxygen  Post-op Assessment: Report given to RN and Post -op Vital signs reviewed and stable  Post vital signs: Reviewed and stable  Last Vitals:  Vitals Value Taken Time  BP 142/71 06/26/2018  2:12 PM  Temp    Pulse 85 06/26/2018  2:13 PM  Resp 16 06/26/2018  2:13 PM  SpO2 93 % 06/26/2018  2:13 PM  Vitals shown include unvalidated device data.  Last Pain:  Vitals:   06/26/18 1412  TempSrc:   PainSc: (P) 0-No pain         Complications: No apparent anesthesia complications

## 2018-06-26 NOTE — Interval H&P Note (Signed)
History and Physical Interval Note:  06/26/2018 12:39 PM  Scott Newman  has presented today for surgery, with the diagnosis of Esophagoscopy with dialation  The various methods of treatment have been discussed with the patient and family. After consideration of risks, benefits and other options for treatment, the patient has consented to  Procedure(s): ESOPHAGOSCOPY WITH DILITATION (N/A) as a surgical intervention .  The patient's history has been reviewed, patient examined, no change in status, stable for surgery.  I have reviewed the patient's chart and labs.  Questions were answered to the patient's satisfaction.     Izora Gala

## 2018-06-26 NOTE — Anesthesia Procedure Notes (Signed)
Procedure Name: Intubation Date/Time: 06/26/2018 1:42 PM Performed by: Renato Shin, CRNA Pre-anesthesia Checklist: Patient identified, Emergency Drugs available, Suction available and Patient being monitored Patient Re-evaluated:Patient Re-evaluated prior to induction Oxygen Delivery Method: Circle system utilized Preoxygenation: Pre-oxygenation with 100% oxygen Induction Type: IV induction Ventilation: Mask ventilation without difficulty Laryngoscope Size: Miller and 3 Grade View: Grade II Tube type: Oral Tube size: 7.0 mm Number of attempts: 1 Airway Equipment and Method: Stylet Placement Confirmation: ETT inserted through vocal cords under direct vision,  positive ETCO2 and breath sounds checked- equal and bilateral Secured at: 22 cm Tube secured with: Tape Dental Injury: Teeth and Oropharynx as per pre-operative assessment

## 2018-06-26 NOTE — Op Note (Signed)
OPERATIVE REPORT  DATE OF SURGERY: 06/26/2018  PATIENT:  Scott Newman,  72 y.o. male  PRE-OPERATIVE DIAGNOSIS:  dysphagia  POST-OPERATIVE DIAGNOSIS:  dysphagia  PROCEDURE:  Procedure(s): ESOPHAGOSCOPY WITH DILITATION  SURGEON:  Beckie Salts, MD  ASSISTANTS: None  ANESTHESIA:   General   EBL: 0 ml  DRAINS: None  LOCAL MEDICATIONS USED:  None  SPECIMEN:  none  COUNTS:  Correct  PROCEDURE DETAILS: The patient was taken to the operating room and placed on the operating table in the supine position. Following induction of general endotracheal anesthesia, the table was turned 90 degrees.  A laryngoscope was used to visualize the larynx.  There are no abnormalities identified.  Esophagoscope, rigid, cervical, was then used to try to inspect the cervical esophagus.  A few centimeters below the esophageal introitus I encountered a stenotic segment and was unable to pass it, there was no mucosal abnormality identified, no tumor identified, no ulceration.  I did not want to force the scope through.  I then started passing the [facility] dilators starting with a 20 [place] which passed easily.  I advanced slowly up to the last one which was 21 [place] which I was unable to pass all the way and did not want to force it.  I then attempted to repeat esophagoscopy but was unable to pass the esophagoscope.  Patient was then awakened extubated and transferred to recovery in stable condition.    PATIENT DISPOSITION:  To PACU, stable

## 2018-06-27 ENCOUNTER — Encounter (HOSPITAL_COMMUNITY): Payer: Self-pay | Admitting: Otolaryngology

## 2018-08-25 ENCOUNTER — Ambulatory Visit: Payer: Medicare Other | Admitting: Cardiology

## 2018-09-01 DIAGNOSIS — R7301 Impaired fasting glucose: Secondary | ICD-10-CM | POA: Diagnosis not present

## 2018-09-01 DIAGNOSIS — D649 Anemia, unspecified: Secondary | ICD-10-CM | POA: Diagnosis not present

## 2018-09-01 DIAGNOSIS — I10 Essential (primary) hypertension: Secondary | ICD-10-CM | POA: Diagnosis not present

## 2018-09-01 DIAGNOSIS — E039 Hypothyroidism, unspecified: Secondary | ICD-10-CM | POA: Diagnosis not present

## 2018-09-01 DIAGNOSIS — Z23 Encounter for immunization: Secondary | ICD-10-CM | POA: Diagnosis not present

## 2018-09-05 DIAGNOSIS — E039 Hypothyroidism, unspecified: Secondary | ICD-10-CM | POA: Diagnosis not present

## 2018-09-05 DIAGNOSIS — R7301 Impaired fasting glucose: Secondary | ICD-10-CM | POA: Diagnosis not present

## 2018-09-05 DIAGNOSIS — I10 Essential (primary) hypertension: Secondary | ICD-10-CM | POA: Diagnosis not present

## 2018-09-05 DIAGNOSIS — K219 Gastro-esophageal reflux disease without esophagitis: Secondary | ICD-10-CM | POA: Insufficient documentation

## 2018-09-05 DIAGNOSIS — E559 Vitamin D deficiency, unspecified: Secondary | ICD-10-CM | POA: Diagnosis not present

## 2018-09-25 ENCOUNTER — Other Ambulatory Visit: Payer: Self-pay | Admitting: Cardiology

## 2018-10-06 ENCOUNTER — Ambulatory Visit: Payer: Medicare Other | Admitting: Cardiology

## 2018-11-03 ENCOUNTER — Other Ambulatory Visit: Payer: Self-pay | Admitting: Cardiology

## 2018-11-03 MED ORDER — LISINOPRIL 2.5 MG PO TABS: 2.5000 mg | ORAL_TABLET | Freq: Every day | ORAL | 0 refills | Status: DC

## 2018-11-03 NOTE — Telephone Encounter (Signed)
New Message     *STAT* If patient is at the pharmacy, call can be transferred to refill team.   1. Which medications need to be refilled? (please list name of each medication and dose if known) Lisinopril   2. Which pharmacy/location (including street and city if local pharmacy) is medication to be sent to? [facility] on [address]   3. Do they need a 30 day or 90 day supply? [place]

## 2018-11-03 NOTE — Telephone Encounter (Signed)
Lisinopril 2.5 mg refilled.

## 2018-11-27 ENCOUNTER — Telehealth: Payer: Medicare Other | Admitting: Cardiology

## 2018-12-24 ENCOUNTER — Other Ambulatory Visit: Payer: Self-pay | Admitting: Cardiology

## 2019-01-09 ENCOUNTER — Telehealth: Payer: Self-pay | Admitting: *Deleted

## 2019-01-09 NOTE — Telephone Encounter (Signed)
A message was left, re: follow up visit. 

## 2019-03-26 ENCOUNTER — Other Ambulatory Visit: Payer: Self-pay | Admitting: *Deleted

## 2019-03-26 MED ORDER — LISINOPRIL 2.5 MG PO TABS: 2.5000 mg | ORAL_TABLET | Freq: Every day | ORAL | 0 refills | Status: DC

## 2019-04-30 NOTE — Progress Notes (Signed)
Virtual Visit via Telephone Note   This visit type was conducted due to national recommendations for restrictions regarding the COVID-19 Pandemic (e.g. social distancing) in an effort to limit this patient's exposure and mitigate transmission in our community.  Due to his co-morbid illnesses, this patient is at least at moderate risk for complications without adequate follow up.  This format is felt to be most appropriate for this patient at this time.  The patient did not have access to video technology/had technical difficulties with video requiring transitioning to audio format only (telephone).  All issues noted in this document were discussed and addressed.  No physical exam could be performed with this format.  Please refer to the patient's chart for his  consent to telehealth for [facility].   Date:  05/01/2019   ID:  Scott Newman, DOB February 08, 1946, MRN [id number]  Patient Location: Home Provider Location: Home  PCP:  No primary care provider on file.  Cardiologist:  Minus Breeding, MD  Electrophysiologist:  None   Evaluation Performed:  Follow-Up Visit  Chief Complaint:  Weakness  History of Present Illness:    Scott Newman is a 73 y.o. male who presents for followup of hypertension and aortic stenosis which has been mild.  I last saw him in 2018.  Since I last saw him he has had no new cardiovascular complaints.  He gets around slowly at home.  The patient denies any new symptoms such as chest discomfort, neck or arm discomfort. There has been no new shortness of breath, PND or orthopnea. There have been no reported palpitations, presyncope or syncope.   He doesn't go out much.    The patient does not have symptoms concerning for COVID-19 infection (fever, chills, cough, or new shortness of breath).   Clear no current Past Medical History:  Diagnosis Date  . Anemia   . Aortic stenosis    mild per 03-31-17 lov dr Mickie Kozikowski epic  . Arthritis   . Chronic diastolic  heart failure ([place])    echocardiogram 12/11: EF 0000000, grade 1 diastolic dysfunction, mild aortic stenosis with a mean gradient of 6 mm of mercury, moderate mitral annular calcification, mild LAE, trivial pericardial effusion;    Myoview 12/11 (during admission for heart failure): No ischemia or scar, EF 63%.  . Colon polyp   . Depression   . Difficulty swallowing    hx of   . Gait abnormality   . Hernia    umbilical and inguinal  . History of alcohol abuse   . History of cellulitis and abscess   . HTN (hypertension)   . Hypothyroidism   . Prostate cancer [facility]) nov 2016 jan 2017   rad tx  . Salmonella age 89  . Thyroid disease    Past Surgical History:  Procedure Laterality Date  . APPENDECTOMY  2012  . COLON SURGERY  05/04/11   Lap assisted right colectomy  . EAR CYST EXCISION Left 07/07/2016   Procedure: BRANCHIAL CLEFT CYST EXCISION;  Surgeon: Izora Gala, MD;  Location: [place];  Service: ENT;  Laterality: Left;  . ESOPHAGOGASTRODUODENOSCOPY (EGD) WITH PROPOFOL N/A 03/16/2017   Procedure: ESOPHAGOGASTRODUODENOSCOPY (EGD) WITH PROPOFOL;  Surgeon: Otis Brace, MD;  Location: [facility];  Service: Gastroenterology;  Laterality: N/A;  . ESOPHAGOSCOPY WITH DILITATION N/A 06/26/2018   Procedure: ESOPHAGOSCOPY WITH DILITATION;  Surgeon: Izora Gala, MD;  Location: [place];  Service: ENT;  Laterality: N/A;  . FLEXIBLE SIGMOIDOSCOPY N/A 03/16/2017   Procedure: FLEXIBLE SIGMOIDOSCOPY;  Surgeon: Otis Brace, MD;  Location: [facility];  Service: Gastroenterology;  Laterality: N/A;  . FLEXIBLE SIGMOIDOSCOPY N/A 05/10/2017   Procedure: Flexible Sigmoidoscopy;  Surgeon: Clarene Essex, MD;  Location: [facility];  Service: Endoscopy;  Laterality: N/A;  with RFA  . HERNIA REPAIR     umbilical and inguinal- rt  . PROSTATE BIOPSY    . ROOT CANAL     x several  . skin wart removed       Current Meds  Medication Sig  . b complex vitamins tablet Take 1 tablet by mouth daily as  needed (energy).   . Cholecalciferol (VITAMIN D) 2000 units CAPS Take 2,000-4,000 Units by mouth daily.   . Chromium Picolinate (CHROMIUM PICOLATE PO) Take 1,000 mcg by mouth daily.  . Coenzyme Q10 (COQ10) 100 MG CAPS Take 100-200 mg by mouth daily as needed (chest weakness).  Marland Kitchen lisinopril (ZESTRIL) 2.5 MG tablet Take 1 tablet (2.5 mg total) by mouth daily.  Marland Kitchen LORazepam (ATIVAN) 0.5 MG tablet Take 0.0625-0.125 mg by mouth daily as needed for anxiety or sleep.   . Lutein 20 MG CAPS Take 20 mg by mouth daily.  Marland Kitchen MAGNESIUM-POTASSIUM PO Take 1 tablet by mouth daily.  Marland Kitchen SYNTHROID 50 MCG tablet TAKE 1 TABLET BY MOUTH EVERY DAY  . Tetrahydrozoline HCl (VISINE OP) Apply 1 drop to eye daily as needed (dry eyes).  . [DISCONTINUED] lisinopril (ZESTRIL) 2.5 MG tablet Take 1 tablet (2.5 mg total) by mouth daily. NEED OV.     Allergies:   Other and Acetaminophen   Social History   Tobacco Use  . Smoking status: Former Smoker    Quit date: 03/17/1965    Years since quitting: 54.1  . Smokeless tobacco: Never Used  . Tobacco comment: social  Substance Use Topics  . Alcohol use: Yes    Alcohol/week: 0.0 standard drinks    Comment: occasional  . Drug use: No     Family Hx: The patient's family history includes Cancer in his paternal aunt and paternal uncle; Heart disease in his father.  ROS:   Please see the history of present illness.    All other systems reviewed and are negative.   Prior CV studies:   The following studies were reviewed today:  Labs from 06/2018  Labs/Other Tests and Data Reviewed:    EKG:  No ECG reviewed.  Recent Labs: 06/26/2018: ALT 45; BUN 9; Creatinine, Ser 1.05; Hemoglobin 15.3; Platelets 220; Potassium 4.2; Sodium 133    Wt Readings from Last 3 Encounters:  06/26/18 207 lb (93.9 kg)  11/01/17 203 lb 6.4 oz (92.3 kg)  06/07/17 180 lb 6.4 oz (81.8 kg)     Objective:    Vital Signs:  BP (!) 146/58   Ht 5\' 8"  (1.727 m)   BMI 31.47 kg/m    VITAL  SIGNS:  reviewed  ASSESSMENT & PLAN:    DIASTOLIC HF:   He sounds like he is euvolemic and careful with his salt and fluid management.  I have encouraged him to get a basic metabolic profile when he seen again.  AS/AI:   I would not suspect by history that this is worse.  I will see him in 1 year in the office and perhaps follow with an echo as needed.   HTN:   The blood pressure is mildly elevated today but this is unusual.  No change in therapy.   CAROTID DOPPLER:   He had no significant plaque 2 years ago.  No  further imaging is indicated.  AAA SCREENING:  I will order this last time I saw him and there was no evidence of aneurysm.  No further imaging.   COVID-19 Education: The signs and symptoms of COVID-19 were discussed with the patient and how to seek care for testing (follow up with PCP or arrange E-visit).  The importance of social distancing was discussed today.  Time:   Today, I have spent 16 minutes with the patient with telehealth technology discussing the above problems.     Medication Adjustments/Labs and Tests Ordered: Current medicines are reviewed at length with the patient today.  Concerns regarding medicines are outlined above.   Tests Ordered: No orders of the defined types were placed in this encounter.   Medication Changes: Meds ordered this encounter  Medications  . lisinopril (ZESTRIL) 2.5 MG tablet    Sig: Take 1 tablet (2.5 mg total) by mouth daily.    Dispense:  90 tablet    Refill:  3    Follow Up:  In Person in 12 months.  Signed, Minus Breeding, MD  05/01/2019 9:52 AM    [facility]

## 2019-05-01 ENCOUNTER — Telehealth (INDEPENDENT_AMBULATORY_CARE_PROVIDER_SITE_OTHER): Payer: Medicare Other | Admitting: Cardiology

## 2019-05-01 ENCOUNTER — Encounter: Payer: Self-pay | Admitting: Cardiology

## 2019-05-01 VITALS — BP 146/58 | Ht 68.0 in

## 2019-05-01 DIAGNOSIS — I359 Nonrheumatic aortic valve disorder, unspecified: Secondary | ICD-10-CM

## 2019-05-01 DIAGNOSIS — I10 Essential (primary) hypertension: Secondary | ICD-10-CM | POA: Diagnosis not present

## 2019-05-01 DIAGNOSIS — I5032 Chronic diastolic (congestive) heart failure: Secondary | ICD-10-CM

## 2019-05-01 MED ORDER — LISINOPRIL 2.5 MG PO TABS: 2.5000 mg | ORAL_TABLET | Freq: Every day | ORAL | 3 refills | Status: DC

## 2019-05-01 NOTE — Patient Instructions (Signed)
Medication Instructions:  Your physician recommends that you continue on your current medications as directed. Please refer to the Current Medication list given to you today.  *If you need a refill on your cardiac medications before your next appointment, please call your pharmacy*  Lab Work: NONE   Testing/Procedures: NONE   Follow-Up: At [facility], you and your health needs are our priority.  As part of our continuing mission to provide you with exceptional heart care, we have created designated Provider Care Teams.  These Care Teams include your primary Cardiologist (physician) and Advanced Practice Providers (APPs -  Physician Assistants and Nurse Practitioners) who all work together to provide you with the care you need, when you need it.  Your next appointment:   12 months  The format for your next appointment:   In Person  Provider:   You may see Minus Breeding, MD or one of the following Advanced Practice Providers on your designated Care Team:    Rosaria Ferries, PA-C  Jory Sims, DNP, ANP  Cadence Kathlen Mody, NP

## 2019-08-26 ENCOUNTER — Ambulatory Visit: Payer: PPO | Attending: Internal Medicine

## 2019-08-26 DIAGNOSIS — Z23 Encounter for immunization: Secondary | ICD-10-CM

## 2019-08-26 NOTE — Progress Notes (Signed)
   Covid-19 Vaccination Clinic  Name:  Scott Newman    MRN: [id number] DOB: Aug 01, 1945  08/26/2019  [name] was observed post Covid-19 immunization for 15 minutes without incidence. He was provided with Vaccine Information Sheet and instruction to access the V-Safe system.   [name] was instructed to call 911 with any severe reactions post vaccine: Marland Kitchen Difficulty breathing  . Swelling of your face and throat  . A fast heartbeat  . A bad rash all over your body  . Dizziness and weakness    Immunizations Administered    Name Date Dose VIS Date Route   Pfizer COVID-19 Vaccine 08/26/2019  9:01 AM 0.3 mL 06/22/2019 Intramuscular   Manufacturer: [place]   Lot: [id number]   [place]: [id number]

## 2019-09-01 IMAGING — RF DG ESOPHAGUS
7 of 8 series · 14 of 24 positions shown · non-contrast
Comparison: CT chest of 03/10/2017

CLINICAL DATA: Pharyngo esophageal dysphagia

EXAM:
ESOPHOGRAM/BARIUM SWALLOW
TECHNIQUE: Single contrast examination was performed using  thin barium.
FLUOROSCOPY TIME:  Fluoroscopy Time:  2 minutes 0 seconds
Radiation Exposure Index (if provided by the fluoroscopic device):
189 mGy
Number of Acquired Spot Images: 0

[Series 1: sequence · 0.28mm/px · 2 of 23 frames shown (1 of 5)]
[frame 4/23]
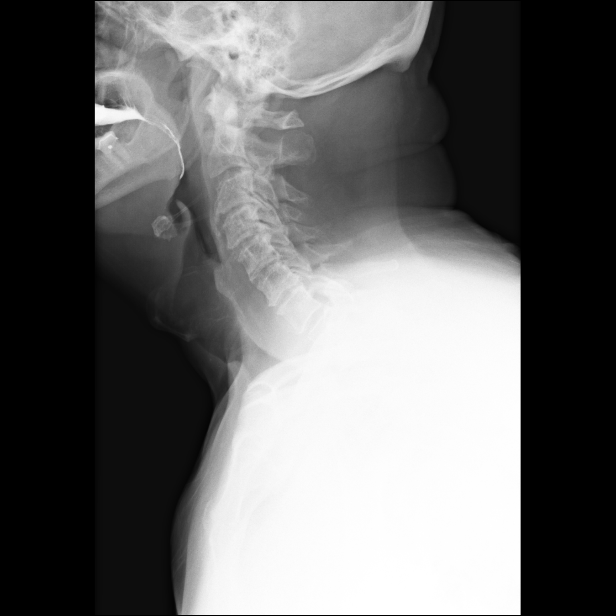
[frame 13/23]
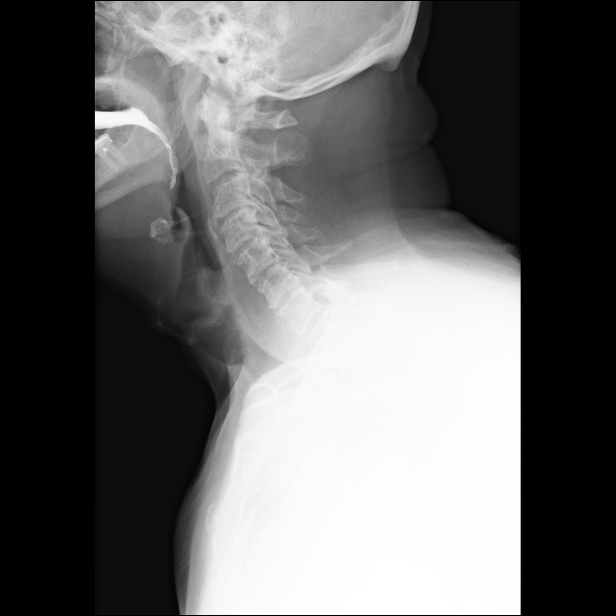

[Series 2: sequence · 0.28mm/px · 3 of 15 frames shown (2 of 5)]
[frame 3/15]
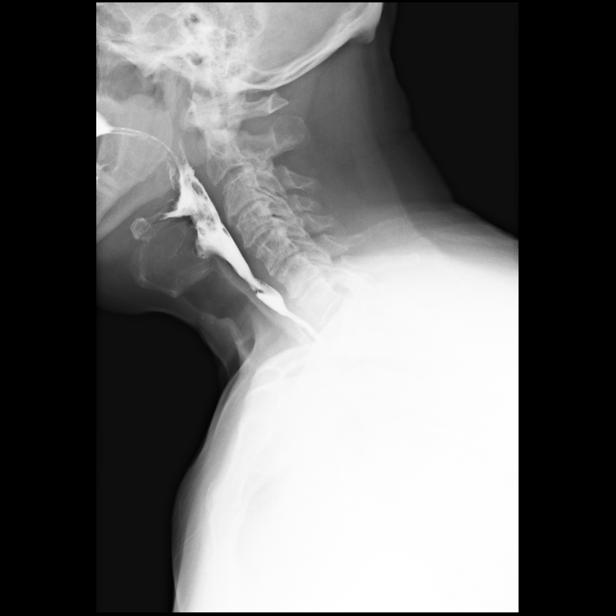
[frame 8/15]
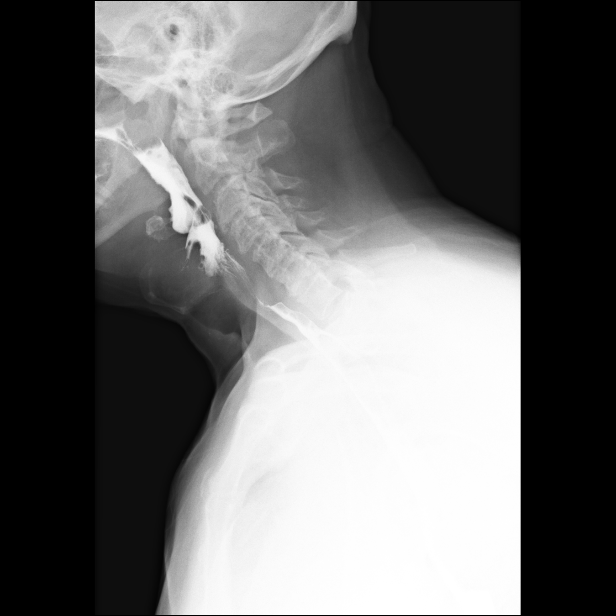
[frame 13/15]
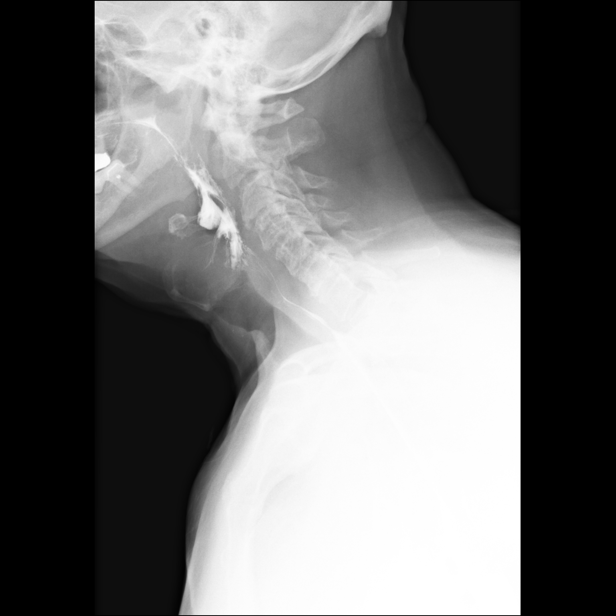

[Series 3: sequence · 0.28mm/px · 2 of 37 frames shown (3 of 5)]
[frame 16/37]
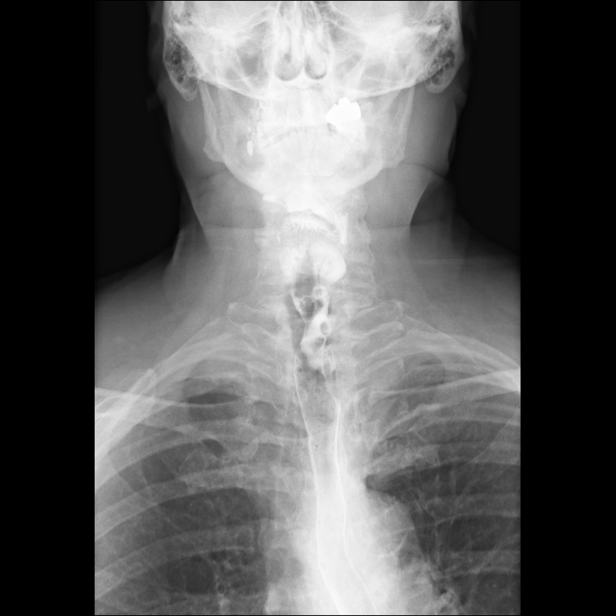
[frame 32/37]
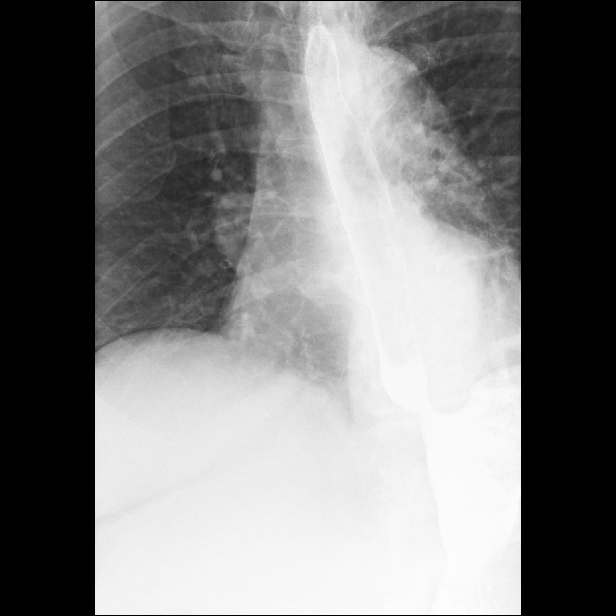

[Series 4: sequence · 0.28mm/px · 2 of 20 frames shown (4 of 5)]
[frame 8/20]
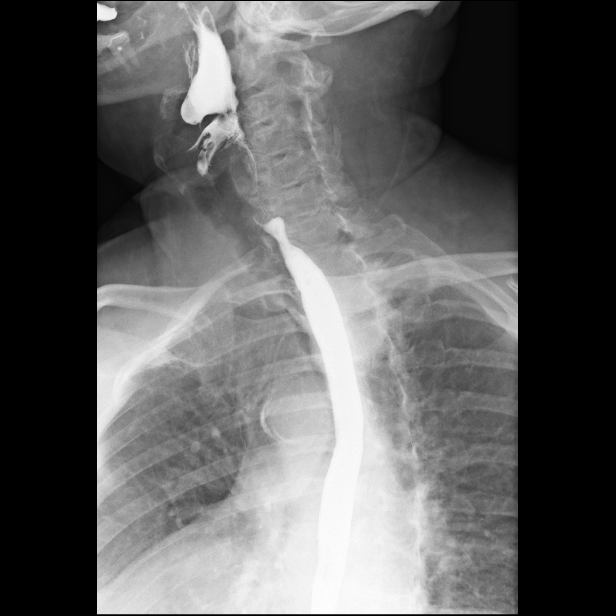
[frame 18/20]
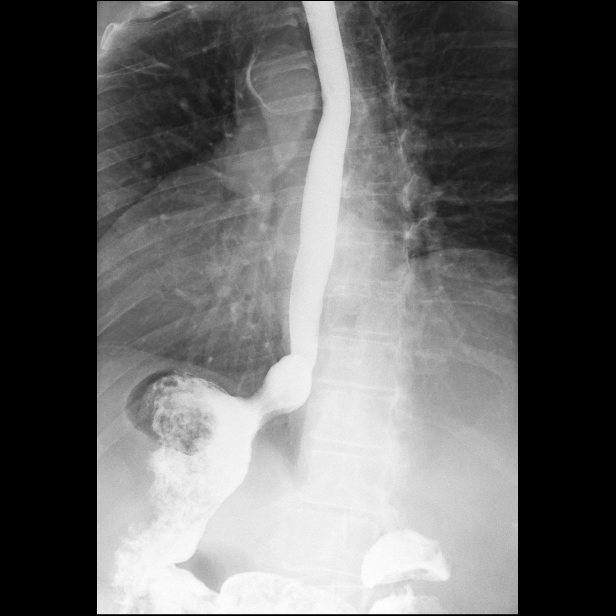

[Series 6: one shot · 2 of 3 slices shown (1 of 2)]
[im 1/3]
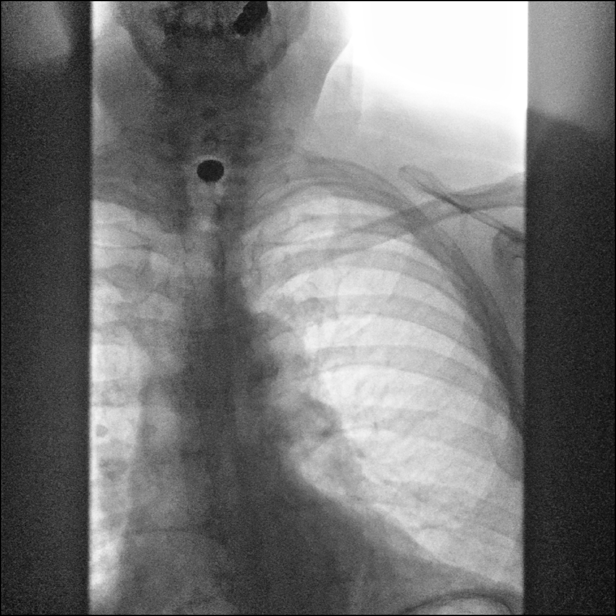
[im 3/3]
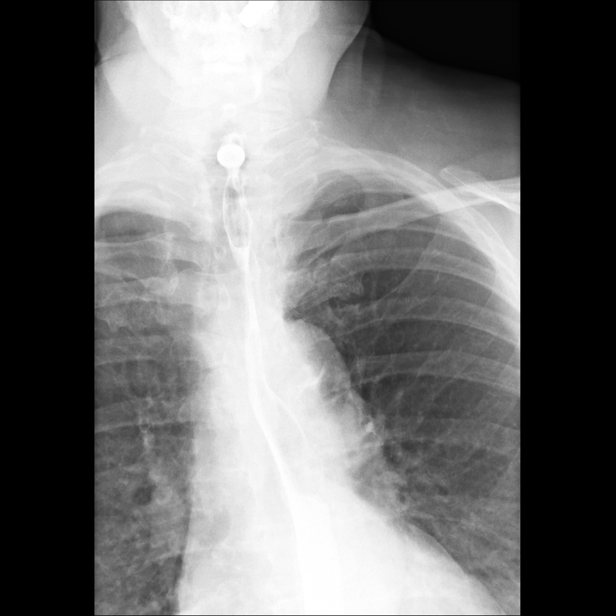

[Series 7: sequence · 0.28mm/px · 2 of 30 frames shown (5 of 5)]
[frame 5/30]
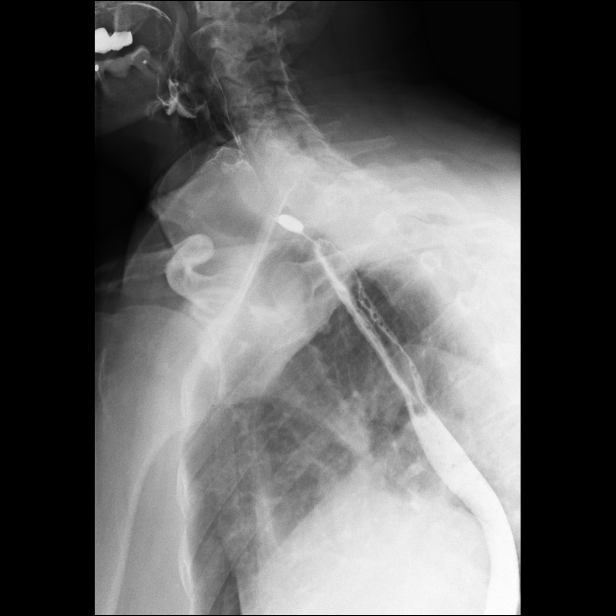
[frame 16/30]
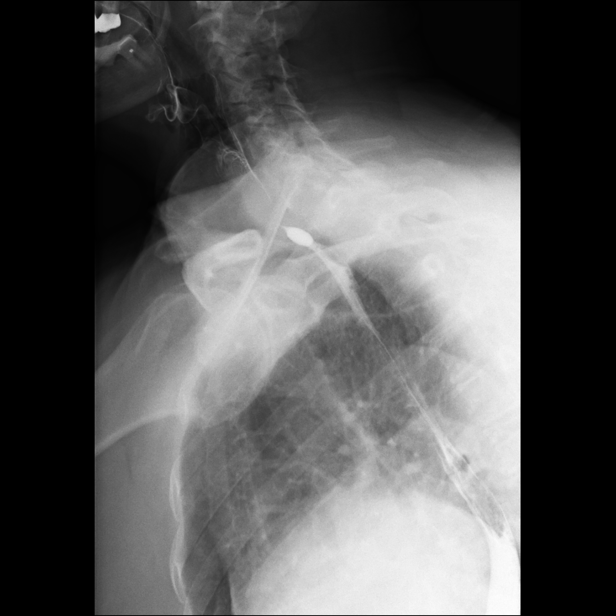

[Series 8: one shot · 0.14mm/px · 1 of 1 slices shown (2 of 2)]
[im 1/1]
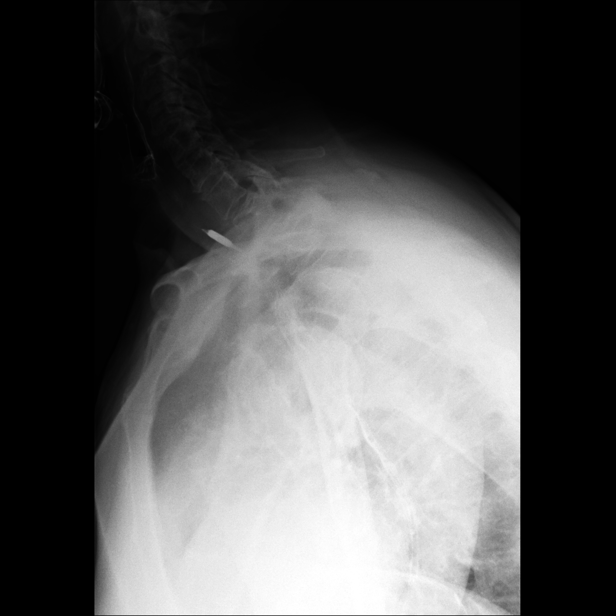

[14 of 24 positions shown; findings below may reference images not displayed]

FINDINGS: With a history of coughing after swallowing, the study was begun in
the lateral projection. There is penetration of barium but no
definite aspiration is seen. There is prominence of the
cricopharyngeus muscle. Well seen on the frontal projection, there
is a persistent narrowing at the cervicothoracic junction. No
mucosal abnormality is seen at that site, and this may represent
stricture, but endoscopy is recommended to evaluate further and
exclude any possibility of malignancy. The more distal esophagus
shows normal peristaltic motion. There is a small sliding hiatal
hernia present. Faint gastroesophageal reflux is demonstrated. The
barium pill at the end the study does lodge at the cervicothoracic
junction, indicating a stricture at that site.
IMPRESSION: 1. There is persistent narrowing of the esophagus at the
cervicothoracic junction where the barium pill lodges and does not
pass. This is consistent with stricture and endoscopy is
recommended.
2. Prominence of the cricopharyngeus muscle.
3. Faint penetration of barium. No definite aspiration on this
study.
4. Small hiatal hernia with faint gastroesophageal reflux.

## 2019-09-17 ENCOUNTER — Ambulatory Visit: Payer: PPO | Attending: Internal Medicine

## 2019-09-17 DIAGNOSIS — Z23 Encounter for immunization: Secondary | ICD-10-CM | POA: Insufficient documentation

## 2019-09-17 NOTE — Progress Notes (Signed)
   Covid-19 Vaccination Clinic  Name:  AMANPREET OATHOUT    MRN: [id number] DOB: 1946/02/25  09/17/2019  Mr. Winebarger was observed post Covid-19 immunization for 15 minutes without incident. He was provided with Vaccine Information Sheet and instruction to access the V-Safe system.   Mr. Levens was instructed to call 911 with any severe reactions post vaccine: Marland Kitchen Difficulty breathing  . Swelling of face and throat  . A fast heartbeat  . A bad rash all over body  . Dizziness and weakness   Immunizations Administered    Name Date Dose VIS Date Route   Pfizer COVID-19 Vaccine 09/17/2019  5:36 PM 0.3 mL 06/22/2019 Intramuscular   Manufacturer: [place]   Lot: [id number]   [place]: [id number]

## 2020-03-26 ENCOUNTER — Other Ambulatory Visit: Payer: Self-pay | Admitting: Cardiology

## 2020-05-05 DIAGNOSIS — E039 Hypothyroidism, unspecified: Secondary | ICD-10-CM | POA: Diagnosis not present

## 2020-05-05 DIAGNOSIS — Z0000 Encounter for general adult medical examination without abnormal findings: Secondary | ICD-10-CM | POA: Diagnosis not present

## 2020-05-05 DIAGNOSIS — E7800 Pure hypercholesterolemia, unspecified: Secondary | ICD-10-CM | POA: Diagnosis not present

## 2020-05-05 DIAGNOSIS — Z125 Encounter for screening for malignant neoplasm of prostate: Secondary | ICD-10-CM | POA: Diagnosis not present

## 2020-05-05 DIAGNOSIS — I10 Essential (primary) hypertension: Secondary | ICD-10-CM | POA: Diagnosis not present

## 2020-05-08 DIAGNOSIS — Z0000 Encounter for general adult medical examination without abnormal findings: Secondary | ICD-10-CM | POA: Diagnosis not present

## 2020-05-08 DIAGNOSIS — E039 Hypothyroidism, unspecified: Secondary | ICD-10-CM | POA: Diagnosis not present

## 2020-05-08 DIAGNOSIS — Z125 Encounter for screening for malignant neoplasm of prostate: Secondary | ICD-10-CM | POA: Diagnosis not present

## 2020-05-08 DIAGNOSIS — I10 Essential (primary) hypertension: Secondary | ICD-10-CM | POA: Diagnosis not present

## 2020-05-08 DIAGNOSIS — F419 Anxiety disorder, unspecified: Secondary | ICD-10-CM | POA: Diagnosis not present

## 2020-05-08 DIAGNOSIS — I350 Nonrheumatic aortic (valve) stenosis: Secondary | ICD-10-CM | POA: Diagnosis not present

## 2020-05-08 DIAGNOSIS — K5900 Constipation, unspecified: Secondary | ICD-10-CM | POA: Diagnosis not present

## 2020-05-08 DIAGNOSIS — Z23 Encounter for immunization: Secondary | ICD-10-CM | POA: Diagnosis not present

## 2020-05-08 DIAGNOSIS — E7800 Pure hypercholesterolemia, unspecified: Secondary | ICD-10-CM | POA: Diagnosis not present

## 2020-09-02 ENCOUNTER — Other Ambulatory Visit (HOSPITAL_COMMUNITY): Payer: Self-pay | Admitting: Family Medicine

## 2020-09-02 DIAGNOSIS — L03115 Cellulitis of right lower limb: Secondary | ICD-10-CM | POA: Diagnosis not present

## 2020-09-02 DIAGNOSIS — R600 Localized edema: Secondary | ICD-10-CM | POA: Diagnosis not present

## 2020-09-02 DIAGNOSIS — M79604 Pain in right leg: Secondary | ICD-10-CM

## 2020-09-03 ENCOUNTER — Other Ambulatory Visit: Payer: Self-pay

## 2020-09-03 ENCOUNTER — Ambulatory Visit (HOSPITAL_COMMUNITY)
Admission: RE | Admit: 2020-09-03 | Discharge: 2020-09-03 | Disposition: A | Discharge status: Home / Self Care | Payer: PPO | Source: Ambulatory Visit | Attending: Family Medicine | Admitting: Family Medicine

## 2020-09-03 DIAGNOSIS — M79604 Pain in right leg: Secondary | ICD-10-CM | POA: Insufficient documentation

## 2020-09-03 DIAGNOSIS — M7989 Other specified soft tissue disorders: Secondary | ICD-10-CM | POA: Diagnosis not present

## 2020-09-03 NOTE — Progress Notes (Signed)
Right lower extremity venous study completed.     Please see CV Proc for preliminary results.   Karisha Marlin, RVT  

## 2020-09-08 DIAGNOSIS — L03115 Cellulitis of right lower limb: Secondary | ICD-10-CM | POA: Diagnosis not present

## 2020-09-08 DIAGNOSIS — L509 Urticaria, unspecified: Secondary | ICD-10-CM | POA: Diagnosis not present

## 2020-09-09 DIAGNOSIS — I872 Venous insufficiency (chronic) (peripheral): Secondary | ICD-10-CM | POA: Diagnosis not present

## 2020-09-09 DIAGNOSIS — L304 Erythema intertrigo: Secondary | ICD-10-CM | POA: Diagnosis not present

## 2020-09-12 DIAGNOSIS — L304 Erythema intertrigo: Secondary | ICD-10-CM | POA: Diagnosis not present

## 2020-09-12 DIAGNOSIS — I872 Venous insufficiency (chronic) (peripheral): Secondary | ICD-10-CM | POA: Diagnosis not present

## 2020-09-12 DIAGNOSIS — L509 Urticaria, unspecified: Secondary | ICD-10-CM | POA: Diagnosis not present

## 2020-09-19 DIAGNOSIS — R21 Rash and other nonspecific skin eruption: Secondary | ICD-10-CM | POA: Diagnosis not present

## 2020-11-03 DIAGNOSIS — E7800 Pure hypercholesterolemia, unspecified: Secondary | ICD-10-CM | POA: Diagnosis not present

## 2020-11-03 DIAGNOSIS — E039 Hypothyroidism, unspecified: Secondary | ICD-10-CM | POA: Diagnosis not present

## 2020-11-03 DIAGNOSIS — E559 Vitamin D deficiency, unspecified: Secondary | ICD-10-CM | POA: Diagnosis not present

## 2021-01-06 DIAGNOSIS — M6281 Muscle weakness (generalized): Secondary | ICD-10-CM | POA: Diagnosis not present

## 2021-01-06 DIAGNOSIS — R21 Rash and other nonspecific skin eruption: Secondary | ICD-10-CM | POA: Diagnosis not present

## 2021-01-06 DIAGNOSIS — R059 Cough, unspecified: Secondary | ICD-10-CM | POA: Diagnosis not present

## 2021-01-06 DIAGNOSIS — M542 Cervicalgia: Secondary | ICD-10-CM | POA: Diagnosis not present

## 2021-01-06 DIAGNOSIS — I10 Essential (primary) hypertension: Secondary | ICD-10-CM | POA: Diagnosis not present

## 2021-01-06 DIAGNOSIS — R7303 Prediabetes: Secondary | ICD-10-CM | POA: Diagnosis not present

## 2021-01-06 DIAGNOSIS — E7800 Pure hypercholesterolemia, unspecified: Secondary | ICD-10-CM | POA: Diagnosis not present

## 2021-01-06 DIAGNOSIS — E039 Hypothyroidism, unspecified: Secondary | ICD-10-CM | POA: Diagnosis not present

## 2021-01-21 DIAGNOSIS — E871 Hypo-osmolality and hyponatremia: Secondary | ICD-10-CM | POA: Diagnosis not present

## 2021-01-21 DIAGNOSIS — R7303 Prediabetes: Secondary | ICD-10-CM | POA: Diagnosis not present

## 2021-01-21 DIAGNOSIS — E782 Mixed hyperlipidemia: Secondary | ICD-10-CM | POA: Diagnosis not present

## 2021-01-21 DIAGNOSIS — I872 Venous insufficiency (chronic) (peripheral): Secondary | ICD-10-CM | POA: Diagnosis not present

## 2021-05-11 DIAGNOSIS — E039 Hypothyroidism, unspecified: Secondary | ICD-10-CM | POA: Diagnosis not present

## 2021-05-11 DIAGNOSIS — Z125 Encounter for screening for malignant neoplasm of prostate: Secondary | ICD-10-CM | POA: Diagnosis not present

## 2021-05-11 DIAGNOSIS — R7303 Prediabetes: Secondary | ICD-10-CM | POA: Diagnosis not present

## 2021-05-11 DIAGNOSIS — E7800 Pure hypercholesterolemia, unspecified: Secondary | ICD-10-CM | POA: Diagnosis not present

## 2021-05-11 DIAGNOSIS — R059 Cough, unspecified: Secondary | ICD-10-CM | POA: Diagnosis not present

## 2021-05-11 DIAGNOSIS — Z0000 Encounter for general adult medical examination without abnormal findings: Secondary | ICD-10-CM | POA: Diagnosis not present

## 2021-05-11 DIAGNOSIS — I10 Essential (primary) hypertension: Secondary | ICD-10-CM | POA: Diagnosis not present

## 2021-05-11 DIAGNOSIS — Z23 Encounter for immunization: Secondary | ICD-10-CM | POA: Diagnosis not present

## 2021-06-29 DIAGNOSIS — I872 Venous insufficiency (chronic) (peripheral): Secondary | ICD-10-CM | POA: Diagnosis not present

## 2021-06-29 DIAGNOSIS — N182 Chronic kidney disease, stage 2 (mild): Secondary | ICD-10-CM | POA: Diagnosis not present

## 2021-06-29 DIAGNOSIS — I5032 Chronic diastolic (congestive) heart failure: Secondary | ICD-10-CM | POA: Diagnosis not present

## 2021-06-29 DIAGNOSIS — R7303 Prediabetes: Secondary | ICD-10-CM | POA: Diagnosis not present

## 2021-06-29 DIAGNOSIS — E871 Hypo-osmolality and hyponatremia: Secondary | ICD-10-CM | POA: Diagnosis not present

## 2021-06-29 DIAGNOSIS — E782 Mixed hyperlipidemia: Secondary | ICD-10-CM | POA: Diagnosis not present

## 2021-06-29 DIAGNOSIS — I350 Nonrheumatic aortic (valve) stenosis: Secondary | ICD-10-CM | POA: Diagnosis not present

## 2021-06-29 DIAGNOSIS — Z0000 Encounter for general adult medical examination without abnormal findings: Secondary | ICD-10-CM | POA: Diagnosis not present

## 2021-06-29 DIAGNOSIS — I10 Essential (primary) hypertension: Secondary | ICD-10-CM | POA: Diagnosis not present

## 2021-06-29 DIAGNOSIS — E039 Hypothyroidism, unspecified: Secondary | ICD-10-CM | POA: Diagnosis not present

## 2021-06-29 DIAGNOSIS — I6523 Occlusion and stenosis of bilateral carotid arteries: Secondary | ICD-10-CM | POA: Diagnosis not present

## 2021-10-27 ENCOUNTER — Other Ambulatory Visit: Payer: Self-pay | Admitting: Gastroenterology

## 2021-10-27 DIAGNOSIS — K222 Esophageal obstruction: Secondary | ICD-10-CM

## 2021-11-09 ENCOUNTER — Other Ambulatory Visit: Payer: Self-pay | Admitting: Internal Medicine

## 2021-11-09 DIAGNOSIS — R600 Localized edema: Secondary | ICD-10-CM

## 2021-11-10 ENCOUNTER — Ambulatory Visit
Admission: RE | Admit: 2021-11-10 | Discharge: 2021-11-10 | Disposition: A | Discharge status: Home / Self Care | Payer: PPO | Source: Ambulatory Visit | Attending: Internal Medicine | Admitting: Internal Medicine

## 2021-11-10 DIAGNOSIS — R600 Localized edema: Secondary | ICD-10-CM

## 2021-11-12 ENCOUNTER — Other Ambulatory Visit: Payer: PPO

## 2022-07-14 ENCOUNTER — Ambulatory Visit (INDEPENDENT_AMBULATORY_CARE_PROVIDER_SITE_OTHER): Payer: PPO | Admitting: Internal Medicine

## 2022-07-14 ENCOUNTER — Encounter: Payer: Self-pay | Admitting: Internal Medicine

## 2022-07-14 VITALS — BP 135/71 | HR 71 | Temp 97.7°F | Ht 68.0 in | Wt 207.8 lb

## 2022-07-14 DIAGNOSIS — R21 Rash and other nonspecific skin eruption: Secondary | ICD-10-CM

## 2022-07-14 DIAGNOSIS — L0390 Cellulitis, unspecified: Secondary | ICD-10-CM | POA: Diagnosis not present

## 2022-07-14 DIAGNOSIS — R269 Unspecified abnormalities of gait and mobility: Secondary | ICD-10-CM

## 2022-07-14 DIAGNOSIS — Z880 Allergy status to penicillin: Secondary | ICD-10-CM

## 2022-07-14 DIAGNOSIS — R270 Ataxia, unspecified: Secondary | ICD-10-CM | POA: Diagnosis not present

## 2022-07-14 HISTORY — DX: Cellulitis, unspecified: L03.90

## 2022-07-14 HISTORY — DX: Rash and other nonspecific skin eruption: R21

## 2022-07-14 MED ORDER — DOXYCYCLINE HYCLATE 100 MG PO TABS: 100.0000 mg | ORAL_TABLET | Freq: Two times a day (BID) | ORAL | 0 refills | Status: DC

## 2022-07-14 NOTE — Assessment & Plan Note (Signed)
Due to skin breaks from rash we reviewed that he is high risk for recurrent cellulitis, so I want him to have antibiotic if it gets redder or more painful.

## 2022-07-14 NOTE — Assessment & Plan Note (Signed)
Encouraged him to do physical therapy but he "just wants to go home and heal" and declined home physical therapy too Advised I will order if he changes his mind Gave fall precautions for hand rails, get rid of rugs (they have)

## 2022-07-14 NOTE — Progress Notes (Signed)
[place]  Phone: [phone]  New patient visit  Visit Date: 07/14/2022 Patient: Scott Newman   DOB: 1946/04/09   77 y.o. Male  MRN: [id number]  Today's healthcare provider: Loralee Pacas, MD  Assessment and Plan:   Mr. Barrales was seen today primarily to get his legs checked out as they have not improved much until very recently despite extensive efforts by his prior primary care, dermatology, vein center.  They have tried to get him wound care but they never called and he does not want me to order it now.  He has tried antibiotics Unna boots steroids and nothing worked until recent homeopathic changes so he does not really want anything else for it at this time despite me trying to find something that would help.  [name] was seen today for establish care and leg pain.  Rash and nonspecific skin eruption Overview: 07/14/22   Patient reports prior infection and then overuse steroid cream and very painful.  But it improved with homeopathic treatment / antifungal lotion tolnaftate.  Prior to that they were using tumeric/honey/vitamin e.   History of unna boots seemed to make the problem worse.  Had following with vein specialist DrAleda Grana who patient reports dermatology gave steroids and that worsened the problem due to duration of use.   He rather avoid dermatology but   Orders: -     Ambulatory referral to Dermatology  Penicillin allergy  Cellulitis, unspecified cellulitis site Assessment & Plan: Due to skin breaks from rash we reviewed that he is high risk for recurrent cellulitis, so I want him to have antibiotic if it gets redder or more painful.   Orders: -     Doxycycline Hyclate; Take 1 tablet (100 mg total) by mouth 2 (two) times daily.  Dispense: 20 tablet; Refill: 0  Ataxia  Gait difficulty Overview: Walks with cane long term   Assessment & Plan: Encouraged him to do physical therapy but he "just wants to go home and heal"  and declined home physical therapy too Advised I will order if he changes his mind Gave fall precautions for hand rails, get rid of rugs (they have)       Subjective:  Patient presents today to establish care.  Chief Complaint  Patient presents with   Establish Care   Leg Pain    Intermittent with blisters sometimes (painful)-produces a clear liquid (was yellowish the day of the colonoscopy). Slight swelling the day of the colonoscopy (10/15/21). Has had trouble with legs since then. Thinks steroid creams being used on legs are the problem, they have discontinued and has had some improvement since. Wife says he has had mental confusion with it also.    Problem-oriented charting was used to develop and update his medical history: Problem  Rash and Nonspecific Skin Eruption   07/14/22   Patient reports prior infection and then overuse steroid cream and very painful.  But it improved with homeopathic treatment / antifungal lotion tolnaftate.  Prior to that they were using tumeric/honey/vitamin e.   History of unna boots seemed to make the problem worse.  Had following with vein specialist DrAleda Grana who patient reports dermatology gave steroids and that worsened the problem due to duration of use.   He rather avoid dermatology but    Penicillin Allergy  Cellulitis  Gait Difficulty   Walks with cane long term       Depression Screen    07/11/2015    3:48 PM  04/14/2015    3:03 PM  PHQ 2/9 Scores  PHQ - 2 Score 0 0   No results found for any visits on 07/14/22.  The following were reviewed and entered/updated into his [facility] History:  Diagnosis Date   Aortic stenosis    mild per 03-31-17 lov dr hochrein epic   Arthritis    Cataract    CHF (congestive heart failure) ([place])    Chronic diastolic heart failure ([place])    echocardiogram 12/11: EF 29-47%, grade 1 diastolic dysfunction, mild aortic stenosis with a mean gradient of 6 mm of mercury,  moderate mitral annular calcification, mild LAE, trivial pericardial effusion;    Myoview 12/11 (during admission for heart failure): No ischemia or scar, EF 63%.   Colon polyp    Depression    Difficulty swallowing    hx of    Gait abnormality    GERD (gastroesophageal reflux disease)    Hernia    umbilical and inguinal   History of alcohol abuse    History of cellulitis and abscess    HTN (hypertension)    Hyperlipidemia    Hypothyroidism    Prostate cancer ([place]) nov 2016 jan 2017   rad tx   Salmonella age 55   Thyroid disease    Past Surgical History:  Procedure Laterality Date   APPENDECTOMY  2012   COLON SURGERY  05/04/11   Lap assisted right colectomy   EAR CYST EXCISION Left 07/07/2016   Procedure: BRANCHIAL CLEFT CYST EXCISION;  Surgeon: Izora Gala, MD;  Location: [facility];  Service: ENT;  Laterality: Left;   ESOPHAGOGASTRODUODENOSCOPY (EGD) WITH PROPOFOL N/A 03/16/2017   Procedure: ESOPHAGOGASTRODUODENOSCOPY (EGD) WITH PROPOFOL;  Surgeon: Otis Brace, MD;  Location: [facility];  Service: Gastroenterology;  Laterality: N/A;   ESOPHAGOSCOPY WITH DILITATION N/A 06/26/2018   Procedure: ESOPHAGOSCOPY WITH DILITATION;  Surgeon: Izora Gala, MD;  Location: [facility];  Service: ENT;  Laterality: N/A;   FLEXIBLE SIGMOIDOSCOPY N/A 03/16/2017   Procedure: FLEXIBLE SIGMOIDOSCOPY;  Surgeon: Otis Brace, MD;  Location: [facility];  Service: Gastroenterology;  Laterality: N/A;   FLEXIBLE SIGMOIDOSCOPY N/A 05/10/2017   Procedure: Flexible Sigmoidoscopy;  Surgeon: Clarene Essex, MD;  Location: [facility];  Service: Endoscopy;  Laterality: N/A;  with RFA   HERNIA REPAIR     umbilical and inguinal- rt   PROSTATE BIOPSY     ROOT CANAL     x several   skin wart removed     Family History  Problem Relation Age of Onset   Hearing loss Mother    Arthritis Mother    Diabetes Father    Alcohol abuse Father    Heart disease Father    Heart attack Father    Cancer Paternal Aunt         breast   Cancer Paternal Uncle        prostate   Outpatient Medications Prior to Visit  Medication Sig Dispense Refill   b complex vitamins tablet Take 1 tablet by mouth daily as needed (energy).      Cholecalciferol (VITAMIN D) 2000 units CAPS Take 2,000-4,000 Units by mouth daily.      Chromium Picolinate (CHROMIUM PICOLATE PO) Take 1,000 mcg by mouth daily.     Coenzyme Q10 (COQ10) 100 MG CAPS Take 100-200 mg by mouth daily as needed (chest weakness).     lisinopril (ZESTRIL) 10 MG tablet Take 10 mg by mouth daily.     LORazepam (ATIVAN) 0.5 MG tablet Take  0.0625-0.125 mg by mouth daily as needed for anxiety or sleep.      Lutein 20 MG CAPS Take 20 mg by mouth daily.     MAGNESIUM-POTASSIUM PO Take 1 tablet by mouth daily.     SYNTHROID 50 MCG tablet TAKE 1 TABLET BY MOUTH EVERY DAY 30 tablet 11   Tetrahydrozoline HCl (VISINE OP) Apply 1 drop to eye daily as needed (dry eyes).     lisinopril (ZESTRIL) 2.5 MG tablet TAKE 1 TABLET(2.5 MG) BY MOUTH DAILY 90 tablet 1   No facility-administered medications prior to visit.    Allergies  Allergen Reactions   Cephalexin    Other     No blood products or steroid creams    Penicillins    Acetaminophen Other (See Comments)    DOESN'T MAKE HIM FEEL WELL    Social History   Tobacco Use   Smoking status: Former    Types: Cigarettes    Quit date: 03/17/1965    Years since quitting: 57.3   Smokeless tobacco: Never   Tobacco comments:    social  Vaping Use   Vaping Use: Never used  Substance Use Topics   Alcohol use: Yes    Alcohol/week: 0.0 standard drinks of alcohol    Comment: occasional   Drug use: Never    Immunization History  Administered Date(s) Administered   PFIZER(Purple Top)SARS-COV-2 Vaccination 08/26/2019, 09/17/2019   Zoster Recombinat (Shingrix) 09/01/2018, 02/16/2019    Objective:  BP 135/71 (BP Location: Right Arm, Patient Position: Sitting)   Pulse 71   Temp 97.7 F (36.5 C) (Temporal)   Ht '5\' 8"'$   (1.727 m)   Wt 207 lb 12.8 oz (94.3 kg)   SpO2 95%   BMI 31.60 kg/m  Body mass index is 31.6 kg/m. indicates this is an Obese male , but waist circumference is a better indicator of healthy body composition. Physical Exam  Vital signs reviewed.  Nursing notes reviewed. General Appearance/Constitutional:  polite male in no acute distress Musculoskeletal: All extremities are intact.  Neurological:  Awake, alert,  No obvious focal neurological deficits or cognitive impairments Psychiatric:  Appropriate mood, pleasant demeanor Problem-specific findings:  ataxic, walks with cane, extensive skin rash legs as image shows , avoidant of care escalations    Results Reviewed: Results for orders placed or performed during the hospital encounter of 06/26/18  CBC  Result Value Ref Range   WBC 5.4 4.0 - 10.5 K/uL   RBC 4.65 4.22 - 5.81 MIL/uL   Hemoglobin 15.3 13.0 - 17.0 g/dL   HCT 46.5 39.0 - 52.0 %   MCV 100.0 80.0 - 100.0 fL   MCH 32.9 26.0 - 34.0 pg   MCHC 32.9 30.0 - 36.0 g/dL   RDW 13.0 11.5 - 15.5 %   Platelets 220 150 - 400 K/uL   nRBC 0.0 0.0 - 0.2 %  Comprehensive metabolic panel  Result Value Ref Range   Sodium 133 (L) 135 - 145 mmol/L   Potassium 4.2 3.5 - 5.1 mmol/L   Chloride 101 98 - 111 mmol/L   CO2 20 (L) 22 - 32 mmol/L   Glucose, Bld 116 (H) 70 - 99 mg/dL   BUN 9 8 - 23 mg/dL   Creatinine, Ser 1.05 0.61 - 1.24 mg/dL   Calcium 8.7 (L) 8.9 - 10.3 mg/dL   Total Protein 7.5 6.5 - 8.1 g/dL   Albumin 4.2 3.5 - 5.0 g/dL   AST 57 (H) 15 - 41 U/L   ALT 45 (  H) 0 - 44 U/L   Alkaline Phosphatase 53 38 - 126 U/L   Total Bilirubin 1.2 0.3 - 1.2 mg/dL   GFR calc non Af Amer >60 >60 mL/min   GFR calc Af Amer >60 >60 mL/min   Anion gap 12 5 - 15

## 2022-08-16 ENCOUNTER — Encounter: Payer: Self-pay | Admitting: Internal Medicine

## 2022-08-16 ENCOUNTER — Ambulatory Visit (INDEPENDENT_AMBULATORY_CARE_PROVIDER_SITE_OTHER): Payer: PPO | Admitting: Internal Medicine

## 2022-08-16 VITALS — BP 134/61 | HR 70 | Temp 98.1°F | Ht 68.0 in | Wt 200.6 lb

## 2022-08-16 DIAGNOSIS — R21 Rash and other nonspecific skin eruption: Secondary | ICD-10-CM

## 2022-08-16 DIAGNOSIS — M79604 Pain in right leg: Secondary | ICD-10-CM | POA: Diagnosis not present

## 2022-08-16 DIAGNOSIS — M79605 Pain in left leg: Secondary | ICD-10-CM

## 2022-08-16 MED ORDER — CELECOXIB 100 MG PO CAPS: 100.0000 mg | ORAL_CAPSULE | Freq: Two times a day (BID) | ORAL | 2 refills | Status: DC

## 2022-08-16 MED ORDER — SULFAMETHOXAZOLE-TRIMETHOPRIM 800-160 MG PO TABS: 1.0000 | ORAL_TABLET | Freq: Two times a day (BID) | ORAL | 0 refills | Status: DC

## 2022-08-16 NOTE — Progress Notes (Signed)
Scott Newman [facility]: [phone]   Routine Medical Office Visit  Patient:  Scott Newman      Age: 77 y.o.       Sex:  male  Date:   08/16/2022  PCP:    Loralee Pacas, [place] Provider: Loralee Pacas, MD   Problem Focused Charting:   Medical Decision Making per Assessment/Plan   Carlus was seen today for 1 month follow-up.  Rash and nonspecific skin eruption Overview: 07/14/22    Patient reports prior infection and then overuse steroid cream and very painful.   Previously improved with homeopathic treatment / antifungal lotion tolnaftate.  Prior to that they were using tumeric/honey/vitamin e.  History of unna boots seemed to make the problem worse.  Had following with vein specialist DrAleda Grana ultrasound 2022 and 2023 found no evidence for venous insufficiency although it looks like a venous stasis ulceration  dermatology gave steroids and that worsened the problem due to duration of use so he has yet to return but saw Allyn Kenner and referral pending  Photographs Taken 08/16/22 showed worsening of the rash but he reported its improving from the 2 week prior. He is taking steroid and silver gel cream after it flared as shown      Assessment & Plan: A little worse in my view... But I don't think its due to the doxycycline I gave... will give bactrim to have on hand in case it worsens further. Prefer this managed by wound care or derm as it seems to be failing current treatment(s)  Only taking lisinopril/synthroid so I don't think its medication induced Doxycycline was helping but upset stomach- it took him 20 days to take 10 pills Wife feels it might be due to pork eating ?salt.... in my medical opinion there isn't any relationship I can think of between salt/pork and a rash like this Strongly encouraged return to derm as referred for possible biopsy  Has seen vein and vascular surgery and Dr. Alphonsus Sias - it seems vein and vascular  surgery have nothing to offer Encouraged get this managed in a wound care clinic, its very painful/chronic He wants to avoid constipation so will try to get Celebrex.  Previously seen 07/2022 primarily to get his legs checked out as they have not improved much until very recently despite extensive efforts by his prior primary care, dermatology, vein center. They have tried to get him wound care but they never called and he does not want me to order it now. He has tried antibiotics Unna boots steroids and nothing worked until recent homeopathic changes so he does not really want anything else for it at this time despite me trying to find something that would help.  I offered derm and vasc and wound care and pain management referral again I offered to help management of pain for now- he can't have NSAID due to bleeding   Orders: -     AMB referral to wound care center -     Sulfamethoxazole-Trimethoprim; Take 1 tablet by mouth 2 (two) times daily.  Dispense: 20 tablet; Refill: 0  Leg pain, bilateral -     Celecoxib; Take 1 capsule (100 mg total) by mouth 2 (two) times daily.  Dispense: 60 capsule; Refill: 2    Needs higher level of care due to not getting better and I'm not sure what the cause is. Strongly encouraged to see derm/wound care soon.     Subjective - Clinical Presentation:  Scott Newman is a 77 y.o. male  Patient Active Problem List   Diagnosis Date Noted   Rash and nonspecific skin eruption 07/14/2022   Penicillin allergy 07/14/2022   Cellulitis 07/14/2022   Laryngopharyngeal reflux (LPR) 09/05/2018   Pharyngoesophageal dysphagia 05/30/2018   Screening for AAA (abdominal aortic aneurysm) 06/07/2017   Bilateral carotid artery disease ([place]) 06/07/2017   Anemia, iron deficiency 03/12/2017   GI bleeding 03/10/2017   Hypothyroidism, acquired 03/10/2017   Branchial cleft cyst 07/27/2016   History of prostate cancer 06/15/2016   Neck mass 06/15/2016   Malignant neoplasm  of prostate ([place]) 04/14/2015   Gait difficulty 07/29/2014   Numbness 07/29/2014   Colon polyps 03/18/2011   ALCOHOL ABUSE 07/24/2010   Chronic diastolic heart failure ([place]) 07/24/2010   HYPERLIPIDEMIA 07/22/2010   Essential hypertension 07/22/2010   VENOUS INSUFFICIENCY 07/22/2010   Past Medical History:  Diagnosis Date   Aortic stenosis    mild per 03-31-17 lov dr hochrein epic   Arthritis    Cataract    CHF (congestive heart failure) ([place])    Chronic diastolic heart failure ([place])    echocardiogram 12/11: EF 87-68%, grade 1 diastolic dysfunction, mild aortic stenosis with a mean gradient of 6 mm of mercury, moderate mitral annular calcification, mild LAE, trivial pericardial effusion;    Myoview 12/11 (during admission for heart failure): No ischemia or scar, EF 63%.   Colon polyp    Depression    Difficulty swallowing    hx of    Gait abnormality    GERD (gastroesophageal reflux disease)    Hernia    umbilical and inguinal   History of alcohol abuse    History of cellulitis and abscess    HTN (hypertension)    Hyperlipidemia    Hypothyroidism    Prostate cancer [facility]) nov 2016 jan 2017   rad tx   Salmonella age 71   Thyroid disease     Outpatient Medications Prior to Visit  Medication Sig   b complex vitamins tablet Take 1 tablet by mouth daily as needed (energy).    Cholecalciferol (VITAMIN D) 2000 units CAPS Take 2,000-4,000 Units by mouth daily.    Chromium Picolinate (CHROMIUM PICOLATE PO) Take 1,000 mcg by mouth daily.   Coenzyme Q10 (COQ10) 100 MG CAPS Take 100-200 mg by mouth daily as needed (chest weakness).   lisinopril (ZESTRIL) 10 MG tablet Take 10 mg by mouth daily.   LORazepam (ATIVAN) 0.5 MG tablet Take 0.0625-0.125 mg by mouth daily as needed for anxiety or sleep.    Lutein 20 MG CAPS Take 20 mg by mouth daily.   MAGNESIUM-POTASSIUM PO Take 1 tablet by mouth daily.   SYNTHROID 50 MCG tablet TAKE 1 TABLET BY MOUTH EVERY DAY   Tetrahydrozoline HCl (VISINE  OP) Apply 1 drop to eye daily as needed (dry eyes).   [DISCONTINUED] doxycycline (VIBRA-TABS) 100 MG tablet Take 1 tablet (100 mg total) by mouth 2 (two) times daily.   No facility-administered medications prior to visit.    Chief Complaint  Patient presents with   1 month follow-up    New break out of blisters. Took 25 days to take doxycycline (it was upsetting his stomach). Pain is worse (legs).    HPI  Doxycycline helped the rash, but then it got even worse Wife attributes to eating pork / salt, but also thinks doxycycline might contribute He couldn't take doxycycline as prescribed due to stomach issues with it.  Objective:  Physical Exam  BP 134/61 (BP Location: Right Arm, Patient Position: Sitting)   Pulse 70   Temp 98.1 F (36.7 C) (Temporal)   Ht '5\' 8"'$  (1.727 m)   Wt 200 lb 9.6 oz (91 kg)   SpO2 96%   BMI 30.50 kg/m  Obese  by BMI criteria but truncal adiposity (waist circumference or caliper) should be used instead. Wt Readings from Last 10 Encounters:  08/16/22 200 lb 9.6 oz (91 kg)  07/14/22 207 lb 12.8 oz (94.3 kg)  06/26/18 207 lb (93.9 kg)  11/01/17 203 lb 6.4 oz (92.3 kg)  06/07/17 180 lb 6.4 oz (81.8 kg)  05/10/17 176 lb (79.8 kg)  04/28/17 176 lb 11.2 oz (80.2 kg)  03/31/17 177 lb (80.3 kg)  03/14/17 176 lb 12.9 oz (80.2 kg)  06/29/16 193 lb 4.8 oz (87.7 kg)   Vital signs reviewed.  Nursing notes reviewed. Weight trend reviewed. General Appearance:  Well developed, well nourished male in no acute distress.   Normal work of breathing at rest Musculoskeletal: All extremities are intact.  Neurological:  Awake, alert,  No obvious focal neurological deficits or cognitive impairments Psychiatric:  Appropriate mood, pleasant demeanor Problem-specific findings:  see problem based images     Signed: Loralee Pacas, MD 08/16/2022 7:43 PM

## 2022-08-16 NOTE — Assessment & Plan Note (Addendum)
A little worse in my view... But I don't think its due to the doxycycline I gave... will give bactrim to have on hand in case it worsens further. Prefer this managed by wound care or derm as it seems to be failing current treatment(s)  Only taking lisinopril/synthroid so I don't think its medication induced Doxycycline was helping but upset stomach- it took him 20 days to take 10 pills Wife feels it might be due to pork eating ?salt.... in my medical opinion there isn't any relationship I can think of between salt/pork and a rash like this Strongly encouraged return to derm as referred for possible biopsy  Has seen vein and vascular surgery and Dr. Alphonsus Sias - it seems vein and vascular surgery have nothing to offer Encouraged get this managed in a wound care clinic, its very painful/chronic He wants to avoid constipation so will try to get Celebrex.  Previously seen 07/2022 primarily to get his legs checked out as they have not improved much until very recently despite extensive efforts by his prior primary care, dermatology, vein center. They have tried to get him wound care but they never called and he does not want me to order it now. He has tried antibiotics Unna boots steroids and nothing worked until recent homeopathic changes so he does not really want anything else for it at this time despite me trying to find something that would help.  I offered derm and vasc and wound care and pain management referral again I offered to help management of pain for now- he can't have NSAID due to bleeding

## 2022-08-23 DIAGNOSIS — I872 Venous insufficiency (chronic) (peripheral): Secondary | ICD-10-CM | POA: Diagnosis not present

## 2022-09-13 DIAGNOSIS — I872 Venous insufficiency (chronic) (peripheral): Secondary | ICD-10-CM | POA: Diagnosis not present

## 2022-09-16 ENCOUNTER — Telehealth: Payer: Self-pay

## 2022-09-16 NOTE — Telephone Encounter (Signed)
Called patient to schedule Medicare Annual Wellness Visit (AWV). Left message for patient to call back and schedule Medicare Annual Wellness Visit (AWV).  Last date of AWV: 07/12/09  Please schedule an appointment at any time with [facility].  Norton Blizzard, [place] (AAMA)  [place] [facility] [phone]

## 2022-10-04 ENCOUNTER — Encounter: Payer: Self-pay | Admitting: Internal Medicine

## 2022-10-04 ENCOUNTER — Ambulatory Visit (INDEPENDENT_AMBULATORY_CARE_PROVIDER_SITE_OTHER): Payer: PPO

## 2022-10-04 VITALS — BP 128/70 | HR 82 | Temp 97.7°F | Wt 201.8 lb

## 2022-10-04 DIAGNOSIS — Z0000 Encounter for general adult medical examination without abnormal findings: Secondary | ICD-10-CM

## 2022-10-04 NOTE — Patient Instructions (Signed)
Scott Newman , Thank you for taking time to come for your Medicare Wellness Visit. I appreciate your ongoing commitment to your health goals. Please review the following plan we discussed and let me know if I can assist you in the future.   These are the goals we discussed:  Goals   None     This is a list of the screening recommended for you and due dates:  Health Maintenance  Topic Date Due   DTaP/Tdap/Td vaccine (1 - Tdap) Never done   Pneumonia Vaccine (1 of 1 - PCV) Never done   Flu Shot  02/09/2022   Medicare Annual Wellness Visit  10/04/2023   Hepatitis C Screening: USPSTF Recommendation to screen - Ages 18-79 yo.  Completed   Zoster (Shingles) Vaccine  Completed   HPV Vaccine  Aged Out   Colon Cancer Screening  Discontinued   COVID-19 Vaccine  Discontinued    Advanced directives: Advance directive discussed with you today. Even though you declined this today please call our office should you change your mind and we can give you the proper paperwork for you to fill out.   Conditions/risks identified: none at this time  Next appointment: Follow up in one year for your annual wellness visit.   Preventive Care 8 Years and Older, Male  Preventive care refers to lifestyle choices and visits with your health care provider that can promote health and wellness. What does preventive care include? A yearly physical exam. This is also called an annual well check. Dental exams once or twice a year. Routine eye exams. Ask your health care provider how often you should have your eyes checked. Personal lifestyle choices, including: Daily care of your teeth and gums. Regular physical activity. Eating a healthy diet. Avoiding tobacco and drug use. Limiting alcohol use. Practicing safe sex. Taking low doses of aspirin every day. Taking vitamin and mineral supplements as recommended by your health care provider. What happens during an annual well check? The services and screenings  done by your health care provider during your annual well check will depend on your age, overall health, lifestyle risk factors, and family history of disease. Counseling  Your health care provider may ask you questions about your: Alcohol use. Tobacco use. Drug use. Emotional well-being. Home and relationship well-being. Sexual activity. Eating habits. History of falls. Memory and ability to understand (cognition). Work and work Statistician. Screening  You may have the following tests or measurements: Height, weight, and BMI. Blood pressure. Lipid and cholesterol levels. These may be checked every 5 years, or more frequently if you are over 89 years old. Skin check. Lung cancer screening. You may have this screening every year starting at age 12 if you have a 30-pack-year history of smoking and currently smoke or have quit within the past 15 years. Fecal occult blood test (FOBT) of the stool. You may have this test every year starting at age 45. Flexible sigmoidoscopy or colonoscopy. You may have a sigmoidoscopy every 5 years or a colonoscopy every 10 years starting at age 6. Prostate cancer screening. Recommendations will vary depending on your family history and other risks. Hepatitis C blood test. Hepatitis B blood test. Sexually transmitted disease (STD) testing. Diabetes screening. This is done by checking your blood sugar (glucose) after you have not eaten for a while (fasting). You may have this done every 1-3 years. Abdominal aortic aneurysm (AAA) screening. You may need this if you are a current or former smoker. Osteoporosis. You may  be screened starting at age 23 if you are at high risk. Talk with your health care provider about your test results, treatment options, and if necessary, the need for more tests. Vaccines  Your health care provider may recommend certain vaccines, such as: Influenza vaccine. This is recommended every year. Tetanus, diphtheria, and acellular  pertussis (Tdap, Td) vaccine. You may need a Td booster every 10 years. Zoster vaccine. You may need this after age 32. Pneumococcal 13-valent conjugate (PCV13) vaccine. One dose is recommended after age 60. Pneumococcal polysaccharide (PPSV23) vaccine. One dose is recommended after age 2. Talk to your health care provider about which screenings and vaccines you need and how often you need them. This information is not intended to replace advice given to you by your health care provider. Make sure you discuss any questions you have with your health care provider. Document Released: 07/25/2015 Document Revised: 03/17/2016 Document Reviewed: 04/29/2015 Elsevier Interactive Patient Education  2017 [place] Prevention in the Home Falls can cause injuries. They can happen to people of all ages. There are many things you can do to make your home safe and to help prevent falls. What can I do on the outside of my home? Regularly fix the edges of walkways and driveways and fix any cracks. Remove anything that might make you trip as you walk through a door, such as a raised step or threshold. Trim any bushes or trees on the path to your home. Use bright outdoor lighting. Clear any walking paths of anything that might make someone trip, such as rocks or tools. Regularly check to see if handrails are loose or broken. Make sure that both sides of any steps have handrails. Any raised decks and porches should have guardrails on the edges. Have any leaves, snow, or ice cleared regularly. Use sand or salt on walking paths during winter. Clean up any spills in your garage right away. This includes oil or grease spills. What can I do in the bathroom? Use night lights. Install grab bars by the toilet and in the tub and shower. Do not use towel bars as grab bars. Use non-skid mats or decals in the tub or shower. If you need to sit down in the shower, use a plastic, non-slip stool. Keep the floor  dry. Clean up any water that spills on the floor as soon as it happens. Remove soap buildup in the tub or shower regularly. Attach bath mats securely with double-sided non-slip rug tape. Do not have throw rugs and other things on the floor that can make you trip. What can I do in the bedroom? Use night lights. Make sure that you have a light by your bed that is easy to reach. Do not use any sheets or blankets that are too big for your bed. They should not hang down onto the floor. Have a firm chair that has side arms. You can use this for support while you get dressed. Do not have throw rugs and other things on the floor that can make you trip. What can I do in the kitchen? Clean up any spills right away. Avoid walking on wet floors. Keep items that you use a lot in easy-to-reach places. If you need to reach something above you, use a strong step stool that has a grab bar. Keep electrical cords out of the way. Do not use floor polish or wax that makes floors slippery. If you must use wax, use non-skid floor wax. Do not  have throw rugs and other things on the floor that can make you trip. What can I do with my stairs? Do not leave any items on the stairs. Make sure that there are handrails on both sides of the stairs and use them. Fix handrails that are broken or loose. Make sure that handrails are as long as the stairways. Check any carpeting to make sure that it is firmly attached to the stairs. Fix any carpet that is loose or worn. Avoid having throw rugs at the top or bottom of the stairs. If you do have throw rugs, attach them to the floor with carpet tape. Make sure that you have a light switch at the top of the stairs and the bottom of the stairs. If you do not have them, ask someone to add them for you. What else can I do to help prevent falls? Wear shoes that: Do not have high heels. Have rubber bottoms. Are comfortable and fit you well. Are closed at the toe. Do not wear  sandals. If you use a stepladder: Make sure that it is fully opened. Do not climb a closed stepladder. Make sure that both sides of the stepladder are locked into place. Ask someone to hold it for you, if possible. Clearly mark and make sure that you can see: Any grab bars or handrails. First and last steps. Where the edge of each step is. Use tools that help you move around (mobility aids) if they are needed. These include: Canes. Walkers. Scooters. Crutches. Turn on the lights when you go into a dark area. Replace any light bulbs as soon as they burn out. Set up your furniture so you have a clear path. Avoid moving your furniture around. If any of your floors are uneven, fix them. If there are any pets around you, be aware of where they are. Review your medicines with your doctor. Some medicines can make you feel dizzy. This can increase your chance of falling. Ask your doctor what other things that you can do to help prevent falls. This information is not intended to replace advice given to you by your health care provider. Make sure you discuss any questions you have with your health care provider. Document Released: 04/24/2009 Document Revised: 12/04/2015 Document Reviewed: 08/02/2014 Elsevier Interactive Patient Education  2017 [employer].

## 2022-10-04 NOTE — Progress Notes (Cosign Needed Addendum)
Subjective:   Scott Newman is a 77 y.o. male who presents for an Initial Medicare Annual Wellness Visit.  Review of Systems     Cardiac Risk Factors include: advanced age (>64men, >58 women);obesity (BMI >30kg/m2);sedentary lifestyle;male gender;hypertension;dyslipidemia     Objective:    Today's Vitals   10/04/22 1550  BP: (!) 160/68  Pulse: 82  Temp: 97.7 F (36.5 C)  SpO2: 92%  Weight: 201 lb 12.8 oz (91.5 kg)   Body mass index is 30.68 kg/m.     10/04/2022    4:07 PM 05/10/2017   11:52 AM 05/09/2017    3:58 PM 03/16/2017   10:59 AM 03/10/2017    5:32 PM 06/29/2016    4:09 PM 04/14/2015    3:03 PM  Advanced Directives  Does Patient Have a Medical Advance Directive? No No No No No No No  Would patient like information on creating a medical advance directive? No - Patient declined No - Patient declined No - Patient declined  No - Patient declined Yes (MAU/Ambulatory/Procedural Areas - Information given) No - patient declined information    Current Medications (verified) Outpatient Encounter Medications as of 10/04/2022  Medication Sig   b complex vitamins tablet Take 1 tablet by mouth daily as needed (energy).    Cholecalciferol (VITAMIN D) 2000 units CAPS Take 2,000-4,000 Units by mouth daily.    Chromium Picolinate (CHROMIUM PICOLATE PO) Take 1,000 mcg by mouth daily.   Coenzyme Q10 (COQ10) 100 MG CAPS Take 100-200 mg by mouth daily as needed (chest weakness).   lisinopril (ZESTRIL) 10 MG tablet Take 10 mg by mouth daily.   LORazepam (ATIVAN) 0.5 MG tablet Take 0.0625-0.125 mg by mouth daily as needed for anxiety or sleep.    Lutein 20 MG CAPS Take 20 mg by mouth daily.   MAGNESIUM-POTASSIUM PO Take 1 tablet by mouth daily.   SYNTHROID 50 MCG tablet TAKE 1 TABLET BY MOUTH EVERY DAY   Tetrahydrozoline HCl (VISINE OP) Apply 1 drop to eye daily as needed (dry eyes).   celecoxib (CELEBREX) 100 MG capsule Take 1 capsule (100 mg total) by mouth 2 (two) times daily.  (Patient not taking: Reported on 10/04/2022)   [DISCONTINUED] sulfamethoxazole-trimethoprim (BACTRIM DS) 800-160 MG tablet Take 1 tablet by mouth 2 (two) times daily.   No facility-administered encounter medications on file as of 10/04/2022.    Allergies (verified) Cephalexin, Other, Oxycodone-acetaminophen, Penicillins, and Acetaminophen   History: Past Medical History:  Diagnosis Date   Aortic stenosis    mild per 03-31-17 lov dr hochrein epic   Arthritis    Cataract    CHF (congestive heart failure) (HCC)    Chronic diastolic heart failure ([place])    echocardiogram 12/11: EF 0000000, grade 1 diastolic dysfunction, mild aortic stenosis with a mean gradient of 6 mm of mercury, moderate mitral annular calcification, mild LAE, trivial pericardial effusion;    Myoview 12/11 (during admission for heart failure): No ischemia or scar, EF 63%.   Colon polyp    Depression    Difficulty swallowing    hx of    Gait abnormality    GERD (gastroesophageal reflux disease)    Hernia    umbilical and inguinal   History of alcohol abuse    History of cellulitis and abscess    HTN (hypertension)    Hyperlipidemia    Hypothyroidism    Prostate cancer [facility]) nov 2016 jan 2017   rad tx   Salmonella age 67   Thyroid disease  Past Surgical History:  Procedure Laterality Date   APPENDECTOMY  2012   COLON SURGERY  05/04/11   Lap assisted right colectomy   EAR CYST EXCISION Left 07/07/2016   Procedure: BRANCHIAL CLEFT CYST EXCISION;  Surgeon: Izora Gala, MD;  Location: [place];  Service: ENT;  Laterality: Left;   ESOPHAGOGASTRODUODENOSCOPY (EGD) WITH PROPOFOL N/A 03/16/2017   Procedure: ESOPHAGOGASTRODUODENOSCOPY (EGD) WITH PROPOFOL;  Surgeon: Otis Brace, MD;  Location: [facility];  Service: Gastroenterology;  Laterality: N/A;   ESOPHAGOSCOPY WITH DILITATION N/A 06/26/2018   Procedure: ESOPHAGOSCOPY WITH DILITATION;  Surgeon: Izora Gala, MD;  Location: [place];  Service: ENT;  Laterality: N/A;    FLEXIBLE SIGMOIDOSCOPY N/A 03/16/2017   Procedure: FLEXIBLE SIGMOIDOSCOPY;  Surgeon: Otis Brace, MD;  Location: [facility];  Service: Gastroenterology;  Laterality: N/A;   FLEXIBLE SIGMOIDOSCOPY N/A 05/10/2017   Procedure: Flexible Sigmoidoscopy;  Surgeon: Clarene Essex, MD;  Location: [facility];  Service: Endoscopy;  Laterality: N/A;  with RFA   HERNIA REPAIR     umbilical and inguinal- rt   PROSTATE BIOPSY     ROOT CANAL     x several   skin wart removed     Family History  Problem Relation Age of Onset   Hearing loss Mother    Arthritis Mother    Diabetes Father    Alcohol abuse Father    Heart disease Father    Heart attack Father    Cancer Paternal Aunt        breast   Cancer Paternal Uncle        prostate   Social History   Socioeconomic History   Marital status: Married    Spouse name: Leda Gauze   Number of children: 0   Years of education: 12 years   Highest education level: Not on file  Occupational History   Occupation: Retired  Tobacco Use   Smoking status: Former    Types: Cigarettes    Quit date: 03/17/1965    Years since quitting: 57.5   Smokeless tobacco: Never   Tobacco comments:    social  Vaping Use   Vaping Use: Never used  Substance and Sexual Activity   Alcohol use: Yes    Alcohol/week: 0.0 standard drinks of alcohol    Comment: occasional   Drug use: Never   Sexual activity: Not Currently  Other Topics Concern   Not on file  Social History Narrative   Retired   Right handed   Lives with wife and mother-in-law   High school education   1-2 cups coffee/day   Social Determinants of Health   Financial Resource Strain: Low Risk  (10/04/2022)   Overall Financial Resource Strain (CARDIA)    Difficulty of Paying Living Expenses: Not hard at all  Food Insecurity: No Food Insecurity (10/04/2022)   Hunger Vital Sign    Worried About Running Out of Food in the Last Year: Never true    Ran Out of Food in the Last Year: Never true   Transportation Needs: No Transportation Needs (10/04/2022)   PRAPARE - Hydrologist (Medical): No    Lack of Transportation (Non-Medical): No  Physical Activity: Inactive (10/04/2022)   Exercise Vital Sign    Days of Exercise per Week: 0 days    Minutes of Exercise per Session: 0 min  Stress: Stress Concern Present (10/04/2022)   [place]    Feeling of Stress : To some extent  Social Connections: Socially Isolated (10/04/2022)   Social Connection and Isolation Panel [NHANES]    Frequency of Communication with Friends and Family: Never    Frequency of Social Gatherings with Friends and Family: Once a week    Attends Religious Services: Never    Marine scientist or Organizations: No    Attends Music therapist: Never    Marital Status: Married    Tobacco Counseling Counseling given: Not Answered Tobacco comments: social   Clinical Intake:  Pre-visit preparation completed: Yes  Pain : No/denies pain     BMI - recorded: 30.68 Nutritional Status: BMI > 30  Obese Nutritional Risks: None Diabetes: No  How often do you need to have someone help you when you read instructions, pamphlets, or other written materials from your doctor or pharmacy?: 1 - Never  Diabetic?no  Interpreter Needed?: No  Information entered by :: Charlott Rakes, LPN   Activities of Daily Living    10/04/2022    4:09 PM  In your present state of health, do you have any difficulty performing the following activities:  Hearing? 1  Comment HOH  Vision? 0  Difficulty concentrating or making decisions? 0  Walking or climbing stairs? 1  Dressing or bathing? 0  Doing errands, shopping? 0  Preparing Food and eating ? N  Using the Toilet? N  In the past six months, have you accidently leaked urine? N  Do you have problems with loss of bowel control? N  Managing your Medications? N   Managing your Finances? N  Housekeeping or managing your Housekeeping? N    Patient Care Team: Loralee Pacas, MD as PCP - General (Internal Medicine) Minus Breeding, MD as PCP - Cardiology (Cardiology)  Indicate any recent Medical Services you may have received from other than [facility] providers in the past year (date may be approximate).     Assessment:   This is a routine wellness examination for [facility].  Hearing/Vision screen Hearing Screening - Comments:: Pt wife stated HOH  Vision Screening - Comments:: Pt follows up with Dr Prudencio Burly for annual eye exams   Dietary issues and exercise activities discussed: Current Exercise Habits: The patient does not participate in regular exercise at present   Goals Addressed             This Visit's Progress    Patient Stated       None at this time        Depression Screen    10/04/2022    4:04 PM 08/16/2022    4:32 PM 07/11/2015    3:48 PM 04/14/2015    3:03 PM  PHQ 2/9 Scores  PHQ - 2 Score 4 6 0 0  PHQ- 9 Score 6 12      Fall Risk    10/04/2022    4:09 PM 08/16/2022    4:31 PM 07/11/2015    3:48 PM 04/14/2015    3:03 PM  [place] in the past year? 0 0 No No  Number falls in past yr: 0 0    Injury with Fall? 0 0    Risk for fall due to : Impaired vision;Impaired balance/gait No Fall Risks    Follow up Falls prevention discussed Falls evaluation completed      FALL RISK PREVENTION PERTAINING TO THE HOME:  Any stairs in or around the home? Yes  If so, are there any without handrails? No  Home free of loose throw rugs in  walkways, pet beds, electrical cords, etc? Yes  Adequate lighting in your home to reduce risk of falls? Yes   ASSISTIVE DEVICES UTILIZED TO PREVENT FALLS:  Life alert? No  Use of a cane, walker or w/c? Yes  Grab bars in the bathroom? No  Shower chair or bench in shower? No  Elevated toilet seat or a handicapped toilet? No   TIMED UP AND GO:  Was the test performed? Yes .  Length of  time to ambulate 10 feet: 20 sec.   Gait slow and steady with assistive device  Cognitive Function:        10/04/2022    4:14 PM  6CIT Screen  What Year? 0 points  What month? 0 points  What time? 0 points  Count back from 20 0 points  Months in reverse 0 points  Repeat phrase 6 points  Total Score 6 points    Immunizations Immunization History  Administered Date(s) Administered   PFIZER(Purple Top)SARS-COV-2 Vaccination 08/26/2019, 09/17/2019   Zoster Recombinat (Shingrix) 09/01/2018, 02/16/2019    TDAP status: Due, Education has been provided regarding the importance of this vaccine. Advised may receive this vaccine at local pharmacy or Health Dept. Aware to provide a copy of the vaccination record if obtained from local pharmacy or Health Dept. Verbalized acceptance and understanding.  Flu Vaccine status: Due, Education has been provided regarding the importance of this vaccine. Advised may receive this vaccine at local pharmacy or Health Dept. Aware to provide a copy of the vaccination record if obtained from local pharmacy or Health Dept. Verbalized acceptance and understanding.  Pneumococcal vaccine status: Due, Education has been provided regarding the importance of this vaccine. Advised may receive this vaccine at local pharmacy or Health Dept. Aware to provide a copy of the vaccination record if obtained from local pharmacy or Health Dept. Verbalized acceptance and understanding.  Covid-19 vaccine status: Completed vaccines  Qualifies for Shingles Vaccine? Yes   Zostavax completed Yes   Shingrix Completed?: Yes  Screening Tests Health Maintenance  Topic Date Due   DTaP/Tdap/Td (1 - Tdap) Never done   Pneumonia Vaccine 44+ Years old (1 of 1 - PCV) Never done   INFLUENZA VACCINE  02/09/2022   Medicare Annual Wellness (AWV)  10/04/2023   Hepatitis C Screening  Completed   Zoster Vaccines- Shingrix  Completed   HPV VACCINES  Aged Out   COLONOSCOPY (Pts 45-44yrs  Insurance coverage will need to be confirmed)  Discontinued   COVID-19 Vaccine  Discontinued    Health Maintenance  Health Maintenance Due  Topic Date Due   DTaP/Tdap/Td (1 - Tdap) Never done   Pneumonia Vaccine 29+ Years old (1 of 1 - PCV) Never done   INFLUENZA VACCINE  02/09/2022    Colorectal cancer screening: No longer required.   Additional Screening:  Hepatitis C Screening:  Completed 06/20/10  Vision Screening: Recommended annual ophthalmology exams for early detection of glaucoma and other disorders of the eye. Is the patient up to date with their annual eye exam?  No  Who is the provider or what is the name of the office in which the patient attends annual eye exams? Dr Prudencio Burly  If pt is not established with a provider, would they like to be referred to a provider to establish care? No .   Dental Screening: Recommended annual dental exams for proper oral hygiene  Community Resource Referral / Chronic Care Management: CRR required this visit?  No   CCM required this visit?  No      Plan:     I have personally reviewed and noted the following in the patient's chart:   Medical and social history Use of alcohol, tobacco or illicit drugs  Current medications and supplements including opioid prescriptions. Patient is not currently taking opioid prescriptions. Functional ability and status Nutritional status Physical activity Advanced directives List of other physicians Hospitalizations, surgeries, and ER visits in previous 12 months Vitals Screenings to include cognitive, depression, and falls Referrals and appointments  In addition, I have reviewed and discussed with patient certain preventive protocols, quality metrics, and best practice recommendations. A written personalized care plan for preventive services as well as general preventive health recommendations were provided to patient.     Willette Brace, LPN   [id number]   Nurse Notes: Pt is requesting  refill for ativan for anxiety and sleep.

## 2022-10-05 ENCOUNTER — Other Ambulatory Visit: Payer: Self-pay | Admitting: Internal Medicine

## 2022-10-05 ENCOUNTER — Encounter: Payer: Self-pay | Admitting: Internal Medicine

## 2022-10-05 DIAGNOSIS — K589 Irritable bowel syndrome without diarrhea: Secondary | ICD-10-CM | POA: Insufficient documentation

## 2022-10-05 MED ORDER — LINACLOTIDE 145 MCG PO CAPS: 145.0000 ug | ORAL_CAPSULE | Freq: Every day | ORAL | 5 refills | Status: DC

## 2022-10-05 NOTE — Telephone Encounter (Signed)
See note

## 2022-10-05 NOTE — Progress Notes (Signed)
Sent linzess as patient reports it helps with irritable bowel syndrome since colonoscopy 2023 Declined benzodiazepine refills request due to age restricted recommendations

## 2022-11-15 ENCOUNTER — Ambulatory Visit (INDEPENDENT_AMBULATORY_CARE_PROVIDER_SITE_OTHER): Payer: PPO | Admitting: Internal Medicine

## 2022-11-15 ENCOUNTER — Encounter: Payer: Self-pay | Admitting: Internal Medicine

## 2022-11-15 VITALS — BP 114/68 | HR 66 | Temp 97.7°F | Ht 68.0 in | Wt 197.6 lb

## 2022-11-15 DIAGNOSIS — E039 Hypothyroidism, unspecified: Secondary | ICD-10-CM

## 2022-11-15 DIAGNOSIS — R5383 Other fatigue: Secondary | ICD-10-CM

## 2022-11-15 DIAGNOSIS — F1090 Alcohol use, unspecified, uncomplicated: Secondary | ICD-10-CM

## 2022-11-15 DIAGNOSIS — Z789 Other specified health status: Secondary | ICD-10-CM

## 2022-11-15 DIAGNOSIS — I10 Essential (primary) hypertension: Secondary | ICD-10-CM | POA: Diagnosis not present

## 2022-11-15 DIAGNOSIS — C61 Malignant neoplasm of prostate: Secondary | ICD-10-CM

## 2022-11-15 DIAGNOSIS — I872 Venous insufficiency (chronic) (peripheral): Secondary | ICD-10-CM

## 2022-11-15 DIAGNOSIS — I5032 Chronic diastolic (congestive) heart failure: Secondary | ICD-10-CM | POA: Diagnosis not present

## 2022-11-15 DIAGNOSIS — R221 Localized swelling, mass and lump, neck: Secondary | ICD-10-CM

## 2022-11-15 DIAGNOSIS — K635 Polyp of colon: Secondary | ICD-10-CM | POA: Diagnosis not present

## 2022-11-15 NOTE — Assessment & Plan Note (Signed)
Check thyroid panel and use it to see if fatigue is related and evaluate cause of hypothyroidism

## 2022-11-15 NOTE — Patient Instructions (Signed)
It was a pleasure seeing you today!  Your health and satisfaction are our top priorities.  Next Steps:  [x]  Early Intervention: Schedule sooner, call our on-call services, or go to emergency room if concerning symptoms arise (pain, changes, new concerns). [x]  Flexible Follow-Up: We recommend a Return in about 3 months (around 02/15/2023). for optimal care. This allows for ideal progress monitoring and treatment adjustments. If the recommended follow-up interval is challenging, consider telemedicine convenience follow up or asking for extended prescriptions (criteria apply). [x]  Preventive Care: Schedule your annual preventive care visit! It's typically covered by insurance and helps identify potential health issues early. [x]  Lab & X-ray Appointments: Incomplete tests scheduled today, or call to schedule. X-rays: [facility] (M-F, 8:30am-noon or 1pm-5pm). [x]  Medical Information Release: Sign a release form at front desk to obtain relevant medical information we don't have.  Bring to Your Next Appointment:  Medications: All medication bottles for an accurate prescription record. Health Diaries: If monitoring health conditions, keep a diary for discussions at your next appointment.  Review your draft clinical notes below and the final encounter summary tomorrow on MyChart. Scott Newman was seen today for annual exam.  Alcohol use Overview: Interim history: Drinks wine and light beer     Venous (peripheral) insufficiency Overview: Chronic venous insufficiency but vein center evaluation 2023 didn't find evidence for this 2023 but has chronic venous stasis dermatitis - we need dermatology consult note    Chronic diastolic heart failure (HCC) Overview: Cardiologist Rollene Rotunda, MD Has had dermatitis on legs associated with this vs chronic venous insufficiency   Assessment & Plan: Encouraged continuing with plan to see Dr. Antoine Poche soon Euvolemic today Encouraged  metabolic panel   Polyp of colon, unspecified part of colon, unspecified type Overview: Flex significant and rfa 2018 DrEwing Schlein, hasn't seen since Patient expresses that there is no bleeding since prior to that 2018 proc   Assessment & Plan: Due to having severe swelling with last colonoscopy- defers/declines recommendations 11/15/22, and has full decision making capacity , to NOT do the 2 year(s) recommended follow up for this due to reasonable concern(s) about bowel prep risks   Essential hypertension Overview: Statin myopathy history: no uses red yeast rice with coq10 Current Regimen: Diet and exercise   Lab Results  Component Value Date   CHOL  06/23/2010    153        ATP III CLASSIFICATION:  <200     mg/dL   Desirable  409-811  mg/dL   Borderline High  >=914    mg/dL   High          Lab Results  Component Value Date   HDL 25 (L) 06/23/2010   Lab Results  Component Value Date   LDLCALC (H) 06/23/2010    107        Total Cholesterol/HDL:CHD Risk Coronary Heart Disease Risk Table                     Men   Women  1/2 Average Risk   3.4   3.3  Average Risk       5.0   4.4  2 X Average Risk   9.6   7.1  3 X Average Risk  23.4   11.0        Use the calculated Patient Ratio above and the CHD Risk Table to determine the patient's CHD Risk.        ATP III CLASSIFICATION (LDL):  <  100     mg/dL   Optimal  696-295  mg/dL   Near or Above                    Optimal  130-159  mg/dL   Borderline  284-132  mg/dL   High  >440     mg/dL   Very High   Lab Results  Component Value Date   TRIG 107 06/23/2010   Lab Results  Component Value Date   CHOLHDL 6.1 06/23/2010   No results found for: "LDLDIRECT"  The ASCVD Risk score (Arnett DK, et al., 2019) failed to calculate for the following reasons:   Cannot find a previous HDL lab   Cannot find a previous total cholesterol lab    Malignant neoplasm of prostate (HCC) Overview: Had 40 radiation treatment(s) in 2016  November Had radiation proctitis and bleeding later resolved with sigmoidoscopic rfa and never returned   Other fatigue  Hypothyroidism, acquired Overview: Interim history: fatigue,   Lab Results  Component Value Date   TSH 26.320 (H) 07/29/2014   anti-TPO antibodies: N/A  anti-Tg antibodies: N/A  Anti-TSI antibodies: N/A    Family history of thyroid disease: none  Patient last evaluated for thyroid nodules: never had Last ultrasound for thyroid nodules:  never had  Last heart exam for rhythm check:  following with Hochrein    Neck mass Overview: Used to have a large mass but Dr. Pollyann Kennedy removed it it was black      Getting Answers and Following Up:  Simple Questions & Concerns: Contact us at [phone] or MyChart messaging. Complex Concerns: Schedule an appointment. Lab & Imaging Results: We'll contact you directly for abnormal results or if you don't use MyChart. Normal results on MyChart within 2-3 business days, with a review message from Dr. Jon Billings. Need results sooner? Contact us. Referrals: Our coordinator manages referrals; the specialist's office will contact you within 2 weeks. Call us if you haven't heard from them after 2 weeks. Staying Connected: Activate MyChart for the fastest, easiest way to access results and message [place]. See instructions on the last page of this paperwork. Billing: X-ray & Lab orders billed by separate companies. Visit charges: discuss with administrative services. Feedback: Share any complaints or compliments with Dr. Jon Billings or Practice Administrators Edwena Felty or Hasna Boutaib). Scheduling Tips: Shortest wait times are 8 am and 1 pm appointments). Longer appointments available, see front desk [employer] may require multiple appointments). We're committed to supporting your health journey. Questions? Contact our office. Remember, you're key to achieving the best outcomes.

## 2022-11-15 NOTE — Assessment & Plan Note (Signed)
Due to having severe swelling with last colonoscopy- defers/declines recommendations 11/15/22, and has full decision making capacity , to NOT do the 2 year(s) recommended follow up for this due to reasonable concern(s) about bowel prep risks

## 2022-11-15 NOTE — Assessment & Plan Note (Signed)
Encouraged continuing with plan to see Dr. Antoine Poche soon Euvolemic today Encouraged metabolic panel

## 2022-11-15 NOTE — Progress Notes (Signed)
[facility]: [phone]   Routine Medical Office Visit  Patient:  Scott Newman      Age: 77 y.o.       Sex:  male  Date:   11/15/2022 PCP:    [name], MD   Today's Healthcare Provider: [name], MD       Assessment and Plan:   Due to leg rash better, today focused on trying to update the rest of his problem list in our charts.  Therefore this was more of a follow up problems update than an annual exam- he had his Annual Wellness Visit (AWV) 10/04/22 and that was previously reviewed   Alcohol use  Venous (peripheral) insufficiency  Chronic diastolic heart failure (HCC) Assessment & Plan: Encouraged continuing with plan to see Dr. Antoine Poche soon Euvolemic today Encouraged metabolic panel  Orders: -     Lipid panel -     Comprehensive metabolic panel -     CBC with Differential/Platelet  Polyp of colon, unspecified part of colon, unspecified type Assessment & Plan: Due to having severe swelling with last colonoscopy- defers/declines recommendations 11/15/22, and has full decision making capacity , to NOT do the 2 year(s) recommended follow up for this due to reasonable concern(s) about bowel prep risks   Essential hypertension -     TSH+T4F+T3Free+ThyAbs+TPO+VD25  Malignant neoplasm of prostate (HCC)  Other fatigue -     TSH+T4F+T3Free+ThyAbs+TPO+VD25  Hypothyroidism, acquired Assessment & Plan: Check thyroid panel and use it to see if fatigue is related and evaluate cause of hypothyroidism   Orders: -     TSH+T4F+T3Free+ThyAbs+TPO+VD25  Neck mass    Treatment plan discussed and reviewed in detail. Explained medication safety and potential side effects. Agreed on patient returning to office if symptoms worsen, persist, or new symptoms develop. Discussed precautions in case of needing to visit the Emergency Department. Answered all patient questions and confirmed understanding and comfort with the plan. Encouraged patient to  contact our office if they have any questions or concerns.         Clinical Presentation:   77 y.o. male here today for Annual Exam  HPI   Updated chart data:  Problem  Hypothyroidism, Acquired   Interim history: fatigue  Patient reports compliance with levothyroxine dailoiy Patient expresses that there is no prior workup history - curious about hashimotos  Lab Results  Component Value Date   TSH 26.320 (H) 07/29/2014  anti-TPO antibodies: N/A  anti-Tg antibodies: N/A  Anti-TSI antibodies: N/A    Family history of thyroid disease: none  Patient last evaluated for thyroid nodules: never had Last ultrasound for thyroid nodules:  never had  Last heart exam for rhythm check:  following with Hochrein    Malignant Neoplasm of Prostate (Hcc)   Had 40 radiation treatment(s) in 2016 November Had radiation proctitis and bleeding later resolved with sigmoidoscopic rfa and never returned   Colon Polyps   Flex significant and rfa 2018 DrEwing Schlein, hasn't seen since Patient expresses that there is no bleeding since prior to that 2018 proc    Alcohol Use   Interim history: Drinks wine and light beer     Chronic Diastolic Heart Failure (Hcc)   Cardiologist Rollene Rotunda, MD Has had dermatitis on legs associated with this vs chronic venous insufficiency    Essential Hypertension   Statin myopathy history: no uses red yeast rice with coq10 Current Regimen: Diet and exercise   Lab Results  Component Value Date  CHOL  06/23/2010    153        ATP III CLASSIFICATION:  <200     mg/dL   Desirable  478-295  mg/dL   Borderline High  >=621    mg/dL   High          Lab Results  Component Value Date   HDL 25 (L) 06/23/2010   Lab Results  Component Value Date   LDLCALC (H) 06/23/2010    107        Total Cholesterol/HDL:CHD Risk Coronary Heart Disease Risk Table                     Men   Women  1/2 Average Risk   3.4   3.3  Average Risk       5.0   4.4  2 X Average Risk    9.6   7.1  3 X Average Risk  23.4   11.0        Use the calculated Patient Ratio above and the CHD Risk Table to determine the patient's CHD Risk.        ATP III CLASSIFICATION (LDL):  <100     mg/dL   Optimal  308-657  mg/dL   Near or Above                    Optimal  130-159  mg/dL   Borderline  846-962  mg/dL   High  >952     mg/dL   Very High   Lab Results  Component Value Date   TRIG 107 06/23/2010   Lab Results  Component Value Date   CHOLHDL 6.1 06/23/2010  No results found for: "LDLDIRECT"  The ASCVD Risk score (Arnett DK, et al., 2019) failed to calculate for the following reasons:   Cannot find a previous HDL lab   Cannot find a previous total cholesterol lab    Venous (Peripheral) Insufficiency   Chronic venous insufficiency but vein center evaluation 2023 didn't find evidence for this 2023 but has chronic venous stasis dermatitis - we need dermatology consult note    Rash and Nonspecific Skin Eruption (Resolved)   07/14/22    Patient reports prior infection and then overuse steroid cream and very painful.   Previously improved with homeopathic treatment / antifungal lotion tolnaftate.  Prior to that they were using tumeric/honey/vitamin e.  History of unna boots seemed to make the problem worse.  Had following with vein specialist DrGuss Bunde ultrasound 2022 and 2023 found no evidence for venous insufficiency although it looks like a venous stasis ulceration  dermatology gave steroids and that worsened the problem due to duration of use so he has yet to return but saw Nita Sells and referral pending  Photographs Taken 08/16/22 showed worsening of the rash but he reported its improving from the 2 week prior. He is taking steroid and silver gel cream after it flared as shown       Cellulitis (Resolved)  Screening for Aaa (Abdominal Aortic Aneurysm) (Resolved)  GI Bleeding (Resolved)  Neck Mass (Resolved)   Used to have a large mass but Dr. Pollyann Kennedy removed  it it was black      Reviewed chart data: Active Ambulatory Problems    Diagnosis Date Noted   HYPERLIPIDEMIA 07/22/2010   Alcohol use 07/24/2010   Essential hypertension 07/22/2010   Chronic diastolic heart failure (HCC) 07/24/2010   Venous (peripheral) insufficiency 07/22/2010   Colon polyps 03/18/2011  Gait difficulty 07/29/2014   Numbness 07/29/2014   Malignant neoplasm of prostate (HCC) 04/14/2015   Hypothyroidism, acquired 03/10/2017   Anemia, iron deficiency 03/12/2017   Bilateral carotid artery disease (HCC) 06/07/2017   History of prostate cancer 06/15/2016   Laryngopharyngeal reflux (LPR) 09/05/2018   Branchial cleft cyst 07/27/2016   Pharyngoesophageal dysphagia 05/30/2018   Penicillin allergy 07/14/2022   IBS (irritable bowel syndrome) 10/05/2022   Resolved Ambulatory Problems    Diagnosis Date Noted   GI bleeding 03/10/2017   Screening for AAA (abdominal aortic aneurysm) 06/07/2017   Neck mass 06/15/2016   Rash and nonspecific skin eruption 07/14/2022   Cellulitis 07/14/2022   Past Medical History:  Diagnosis Date   Aortic stenosis    Arthritis    Cataract    CHF (congestive heart failure) (HCC)    Colon polyp    Depression    Difficulty swallowing    Gait abnormality    GERD (gastroesophageal reflux disease)    Hernia    History of alcohol abuse    History of cellulitis and abscess    HTN (hypertension)    Hyperlipidemia    Hypothyroidism    Prostate cancer (HCC) nov 2016 jan 2017   Salmonella age 1   Thyroid disease     Outpatient Medications Prior to Visit  Medication Sig   b complex vitamins tablet Take 1 tablet by mouth daily as needed (energy).    celecoxib (CELEBREX) 100 MG capsule Take 1 capsule (100 mg total) by mouth 2 (two) times daily.   Cholecalciferol (VITAMIN D) 2000 units CAPS Take 2,000-4,000 Units by mouth daily.    Chromium Picolinate (CHROMIUM PICOLATE PO) Take 1,000 mcg by mouth daily.   Coenzyme Q10 (COQ10) 100 MG CAPS  Take 100-200 mg by mouth daily as needed (chest weakness).   linaclotide (LINZESS) 145 MCG CAPS capsule Take 1 capsule (145 mcg total) by mouth daily before breakfast.   lisinopril (ZESTRIL) 10 MG tablet Take 10 mg by mouth daily.   LORazepam (ATIVAN) 0.5 MG tablet Take 0.0625-0.125 mg by mouth daily as needed for anxiety or sleep.    Lutein 20 MG CAPS Take 20 mg by mouth daily.   MAGNESIUM-POTASSIUM PO Take 1 tablet by mouth daily.   SYNTHROID 50 MCG tablet TAKE 1 TABLET BY MOUTH EVERY DAY   Tetrahydrozoline HCl (VISINE OP) Apply 1 drop to eye daily as needed (dry eyes).   No facility-administered medications prior to visit.           Clinical Data Analysis:   Physical Exam  BP 114/68 (BP Location: Left Arm, Patient Position: Sitting)   Pulse 66   Temp 97.7 F (36.5 C) (Temporal)   Ht 5\' 8"  (1.727 m)   Wt 197 lb 9.6 oz (89.6 kg)   SpO2 96%   BMI 30.04 kg/m  Wt Readings from Last 10 Encounters:  11/15/22 197 lb 9.6 oz (89.6 kg)  10/04/22 201 lb 12.8 oz (91.5 kg)  08/16/22 200 lb 9.6 oz (91 kg)  07/14/22 207 lb 12.8 oz (94.3 kg)  06/26/18 207 lb (93.9 kg)  11/01/17 203 lb 6.4 oz (92.3 kg)  06/07/17 180 lb 6.4 oz (81.8 kg)  05/10/17 176 lb (79.8 kg)  04/28/17 176 lb 11.2 oz (80.2 kg)  03/31/17 177 lb (80.3 kg)   Vital signs reviewed.  Nursing notes reviewed. Weight trend reviewed. Abnormalities and Problem-Specific physical exam findings:  dramatically improved skin on legs, almost no rash anymore, but the skin lost its  elasticity General Appearance:  No acute distress appreciable.   Well-groomed, healthy-appearing male.  Well proportioned with no abnormal fat distribution.  Good muscle tone. Skin: Clear and well-hydrated. Pulmonary:  Normal work of breathing at rest, no respiratory distress apparent. SpO2: 96 %  Musculoskeletal: All extremities are intact.  Neurological:  Awake, alert, oriented, and engaged.  No obvious focal neurological deficits or cognitive impairments.   Sensorium seems unclouded.   Speech is clear and coherent with logical content. Psychiatric:  Appropriate mood, pleasant and cooperative demeanor, thoughtful and engaged during the exam   Additional Results Reviewed:     No results found for any visits on 11/15/22.  No results found for this or any previous visit (from the past 2160 hour(s)).  No image results found.   No results found.   --------------------------------    Signed: [name], MD 11/15/2022 8:27 PM

## 2022-11-16 ENCOUNTER — Encounter: Payer: Self-pay | Admitting: Internal Medicine

## 2022-11-16 LAB — COMPREHENSIVE METABOLIC PANEL
ALT: 120 U/L — ABNORMAL HIGH (ref 0–53)
AST: 118 U/L — ABNORMAL HIGH (ref 0–37)
Albumin: 4.4 g/dL (ref 3.5–5.2)
Alkaline Phosphatase: 86 U/L (ref 39–117)
BUN: 11 mg/dL (ref 6–23)
CO2: 28 mEq/L (ref 19–32)
Calcium: 9.6 mg/dL (ref 8.4–10.5)
Chloride: 91 mEq/L — ABNORMAL LOW (ref 96–112)
Creatinine, Ser: 0.9 mg/dL (ref 0.40–1.50)
GFR: 81.89 mL/min (ref >60.00)
Glucose, Bld: 140 mg/dL — ABNORMAL HIGH (ref 70–99)
Potassium: 4.5 mEq/L (ref 3.5–5.1)
Sodium: 127 mEq/L — ABNORMAL LOW (ref 135–145)
Total Bilirubin: 1.7 mg/dL — ABNORMAL HIGH (ref 0.2–1.2)
Total Protein: 7.6 g/dL (ref 6.0–8.3)

## 2022-11-16 LAB — CBC WITH DIFFERENTIAL/PLATELET
Basophils Absolute: 0.1 10*3/uL (ref 0.0–0.1)
Basophils Relative: 1.3 % (ref 0.0–3.0)
Eosinophils Absolute: 0.2 10*3/uL (ref 0.0–0.7)
Eosinophils Relative: 2.3 % (ref 0.0–5.0)
HCT: 46.4 % (ref 39.0–52.0)
Hemoglobin: 16.4 g/dL (ref 13.0–17.0)
Lymphocytes Relative: 9.7 % — ABNORMAL LOW (ref 12.0–46.0)
Lymphs Abs: 0.8 10*3/uL (ref 0.7–4.0)
MCHC: 35.4 g/dL (ref 30.0–36.0)
MCV: 105.7 fl — ABNORMAL HIGH (ref 78.0–100.0)
Monocytes Absolute: 0.8 10*3/uL (ref 0.1–1.0)
Monocytes Relative: 10.7 % (ref 3.0–12.0)
Neutro Abs: 6 10*3/uL (ref 1.4–7.7)
Neutrophils Relative %: 76 % (ref 43.0–77.0)
Platelets: 177 10*3/uL (ref 150.0–400.0)
RBC: 4.39 Mil/uL (ref 4.22–5.81)
RDW: 12.6 % (ref 11.5–15.5)
WBC: 7.9 10*3/uL (ref 4.0–10.5)

## 2022-11-16 LAB — TSH+T4F+T3FREE+THYABS+TPO+VD25
Free T4: 1.31 ng/dL (ref 0.82–1.77)
T3, Free: 2.3 pg/mL (ref 2.0–4.4)
TSH: 1.3 u[IU]/mL (ref 0.450–4.500)
Thyroglobulin Antibody: 4.7 IU/mL — ABNORMAL HIGH (ref 0.0–0.9)
Thyroperoxidase Ab SerPl-aCnc: 43 IU/mL — ABNORMAL HIGH (ref 0–34)
Vit D, 25-Hydroxy: 41.7 ng/mL (ref 30.0–100.0)

## 2022-11-16 LAB — LIPID PANEL
Cholesterol: 215 mg/dL — ABNORMAL HIGH (ref 0–200)
HDL: 38.4 mg/dL — ABNORMAL LOW (ref >39.00)
LDL Cholesterol: 142 mg/dL — ABNORMAL HIGH (ref 0–99)
NonHDL: 176.88
Total CHOL/HDL Ratio: 6
Triglycerides: 174 mg/dL — ABNORMAL HIGH (ref 0.0–149.0)
VLDL: 34.8 mg/dL (ref 0.0–40.0)

## 2022-11-16 NOTE — Progress Notes (Signed)
Please place any orders needed / requested. Arrange/confirm follow up appt(s).

## 2022-11-18 ENCOUNTER — Other Ambulatory Visit: Payer: Self-pay

## 2022-11-18 DIAGNOSIS — R5383 Other fatigue: Secondary | ICD-10-CM

## 2022-11-18 DIAGNOSIS — E871 Hypo-osmolality and hyponatremia: Secondary | ICD-10-CM

## 2022-11-30 ENCOUNTER — Other Ambulatory Visit (INDEPENDENT_AMBULATORY_CARE_PROVIDER_SITE_OTHER): Payer: PPO

## 2022-11-30 DIAGNOSIS — R5383 Other fatigue: Secondary | ICD-10-CM

## 2022-11-30 DIAGNOSIS — E871 Hypo-osmolality and hyponatremia: Secondary | ICD-10-CM | POA: Diagnosis not present

## 2022-12-01 LAB — COMPREHENSIVE METABOLIC PANEL
ALT: 86 U/L — ABNORMAL HIGH (ref 0–53)
AST: 89 U/L — ABNORMAL HIGH (ref 0–37)
Albumin: 4.2 g/dL (ref 3.5–5.2)
Alkaline Phosphatase: 85 U/L (ref 39–117)
BUN: 6 mg/dL (ref 6–23)
CO2: 28 mEq/L (ref 19–32)
Calcium: 9.5 mg/dL (ref 8.4–10.5)
Chloride: 92 mEq/L — ABNORMAL LOW (ref 96–112)
Creatinine, Ser: 0.85 mg/dL (ref 0.40–1.50)
GFR: 83.29 mL/min (ref >60.00)
Glucose, Bld: 156 mg/dL — ABNORMAL HIGH (ref 70–99)
Potassium: 4.1 mEq/L (ref 3.5–5.1)
Sodium: 128 mEq/L — ABNORMAL LOW (ref 135–145)
Total Bilirubin: 1.7 mg/dL — ABNORMAL HIGH (ref 0.2–1.2)
Total Protein: 7.3 g/dL (ref 6.0–8.3)

## 2022-12-01 LAB — HEMOGLOBIN A1C: Hgb A1c MFr Bld: 5.8 % (ref 4.6–6.5)

## 2022-12-01 LAB — B12 AND FOLATE PANEL
Folate: 6.9 ng/mL (ref >5.9)
Vitamin B-12: 584 pg/mL (ref 211–911)

## 2022-12-01 NOTE — Progress Notes (Unsigned)
Cardiology Office Note   Date:  12/02/2022   ID:  Manus, Bergthold Jul 26, 1945, MRN [id number]  PCP:  [name], MD  Cardiologist:   Rollene Rotunda, MD Referring:  [name], MD  Chief Complaint  Patient presents with   Aortic Valve Disease      History of Present Illness: Scott Newman is a 77 y.o. male who presents for  who presents for followup of hypertension and aortic stenosis which has been mild.  I last saw him in 2020.  He has had a lot of trouble over the last year with swelling in his legs and nonhealing stasis dermatitis.  He and his wife describe a slow recovery from this.  He has finally had healing.  Apparently he has had increased real triggering this time.  He has been less mobile.  He is walking with a cane but gets around the house really only.  His wife says he does not go out very much except for doctors appointments.  He is not describing any new cardiovascular symptoms such as chest pressure, neck or arm discomfort.  He is not having any new shortness of breath, PND or orthopnea.  He has no new palpitations, presyncope or syncope.   Past Medical History:  Diagnosis Date   Aortic stenosis    mild per 03-31-17 lov dr Tiara Maultsby epic   Arthritis    Cataract    Cellulitis 07/14/2022   Chronic diastolic heart failure (HCC)    echocardiogram 12/11: EF 55-60%, grade 1 diastolic dysfunction, mild aortic stenosis with a mean gradient of 6 mm of mercury, moderate mitral annular calcification, mild LAE, trivial pericardial effusion;    Myoview 12/11 (during admission for heart failure): No ischemia or scar, EF 63%.   Colon polyp    Depression    Difficulty swallowing    hx of    Gait abnormality    GERD (gastroesophageal reflux disease)    GI bleeding 03/10/2017   Hernia    umbilical and inguinal   History of alcohol abuse    History of cellulitis and abscess    HTN (hypertension)    Hyperlipidemia    Hypothyroidism    Neck mass 06/15/2016    Used to have a large mass but Dr. Pollyann Kennedy removed it it was black   Prostate cancer [facility]) nov 2016 jan 2017   rad tx   Rash and nonspecific skin eruption 07/14/2022   07/14/22       Patient reports prior infection and then overuse steroid cream and very painful.    Previously improved with homeopathic treatment / antifungal lotion tolnaftate.  Prior to that they were using tumeric/honey/vitamin e.   History of unna boots seemed to make the problem worse.   Had following with vein specialist DrGuss Bunde ultrasound 2022 and 2023 found no evidence for venous i   Rash and nonspecific skin eruption 07/14/2022   07/14/22       Patient reports prior infection and then overuse steroid cream and very painful.    Previously improved with homeopathic treatment / antifungal lotion tolnaftate.  Prior to that they were using tumeric/honey/vitamin e.   History of unna boots seemed to make the problem worse.   Had following with vein specialist DrGuss Bunde ultrasound 2022 and 2023 found no evidence for venous i   Salmonella age 44   Screening for AAA (abdominal aortic aneurysm) 06/07/2017   Thyroid disease     Past Surgical History:  Procedure Laterality Date   APPENDECTOMY  2012   COLON SURGERY  05/04/11   Lap assisted right colectomy   EAR CYST EXCISION Left 07/07/2016   Procedure: BRANCHIAL CLEFT CYST EXCISION;  Surgeon: Serena Colonel, MD;  Location: [facility] OR;  Service: ENT;  Laterality: Left;   ESOPHAGOGASTRODUODENOSCOPY (EGD) WITH PROPOFOL N/A 03/16/2017   Procedure: ESOPHAGOGASTRODUODENOSCOPY (EGD) WITH PROPOFOL;  Surgeon: Kathi Der, MD;  Location: [facility] ENDOSCOPY;  Service: Gastroenterology;  Laterality: N/A;   ESOPHAGOSCOPY WITH DILITATION N/A 06/26/2018   Procedure: ESOPHAGOSCOPY WITH DILITATION;  Surgeon: Serena Colonel, MD;  Location: [facility] OR;  Service: ENT;  Laterality: N/A;   FLEXIBLE SIGMOIDOSCOPY N/A 03/16/2017   Procedure: FLEXIBLE SIGMOIDOSCOPY;  Surgeon: Kathi Der, MD;  Location:  [facility]COPY;  Service: Gastroenterology;  Laterality: N/A;   FLEXIBLE SIGMOIDOSCOPY N/A 05/10/2017   Procedure: Flexible Sigmoidoscopy;  Surgeon: Vida Rigger, MD;  Location: [facility]COPY;  Service: Endoscopy;  Laterality: N/A;  with RFA   HERNIA REPAIR     umbilical and inguinal- rt   PROSTATE BIOPSY     ROOT CANAL     x several   skin wart removed       Current Outpatient Medications  Medication Sig Dispense Refill   b complex vitamins tablet Take 1 tablet by mouth daily as needed (energy).      Cholecalciferol (VITAMIN D) 2000 units CAPS Take 2,000-4,000 Units by mouth daily.      Chromium Picolinate (CHROMIUM PICOLATE PO) Take 1,000 mcg by mouth daily.     Coenzyme Q10 (COQ10) 100 MG CAPS Take 100-200 mg by mouth daily as needed (chest weakness).     linaclotide (LINZESS) 145 MCG CAPS capsule Take 1 capsule (145 mcg total) by mouth daily before breakfast. 30 capsule 5   lisinopril (ZESTRIL) 10 MG tablet Take 10 mg by mouth daily.     LORazepam (ATIVAN) 0.5 MG tablet Take 0.0625-0.125 mg by mouth daily as needed for anxiety or sleep.      Lutein 20 MG CAPS Take 20 mg by mouth daily.     MAGNESIUM-POTASSIUM PO Take 1 tablet by mouth daily.     SYNTHROID 50 MCG tablet TAKE 1 TABLET BY MOUTH EVERY DAY 30 tablet 11   Tetrahydrozoline HCl (VISINE OP) Apply 1 drop to eye daily as needed (dry eyes).     celecoxib (CELEBREX) 100 MG capsule Take 1 capsule (100 mg total) by mouth 2 (two) times daily. (Patient not taking: Reported on 12/02/2022) 60 capsule 2   No current facility-administered medications for this visit.    Allergies:   Cephalexin, Other, Oxycodone-acetaminophen, Penicillins, and Acetaminophen    Social History:  The patient  reports that he quit smoking about 57 years ago. His smoking use included cigarettes. He has never used smokeless tobacco. He reports current alcohol use. He reports that he does not use drugs.   Family History:  The patient's family history includes  Alcohol abuse in his father; Arthritis in his mother; Cancer in his paternal aunt and paternal uncle; Diabetes in his father; Hearing loss in his mother; Heart attack in his father; Heart disease in his father.    ROS:  Please see the history of present illness.   Otherwise, review of systems are positive for none.   All other systems are reviewed and negative.    PHYSICAL EXAM: VS:  BP 136/60 (BP Location: Left Arm, Patient Position: Sitting, Cuff Size: Normal)   Pulse 70   Ht 5\' 7"  (1.702 m)  Wt 198 lb (89.8 kg)   SpO2 96%   BMI 31.01 kg/m  , BMI Body mass index is 31.01 kg/m. GENERAL:  Well appearing HEENT:  Pupils equal round and reactive, fundi not visualized, oral mucosa unremarkable NECK:  No jugular venous distention, waveform within normal limits, carotid upstroke brisk and symmetric, no bruits, no thyromegaly LYMPHATICS:  No cervical, inguinal adenopathy LUNGS:  Clear to auscultation bilaterally BACK:  No CVA tenderness CHEST:  Unremarkable HEART:  PMI not displaced or sustained,S1 and S2 within normal limits, no S3, no S4, no clicks, no rubs, 3 out of 6 apical systolic murmur radiating at the aortic outflow tract, brief diastolic murmur  ABD:  Flat, positive bowel sounds normal in frequency in pitch, no bruits, no rebound, no guarding, no midline pulsatile mass, no hepatomegaly, no splenomegaly EXT:  2 plus pulses throughout, no edema, no cyanosis no clubbing SKIN:  No rashes no nodules NEURO:  Cranial nerves II through XII grossly intact, motor grossly intact throughout PSYCH:  Cognitively intact, oriented to person place and time    EKG:  EKG is ordered today. The ekg ordered today demonstrates sinus rhythm, rate 70, axis within normal limits, intervals within normal limits, no acute ST-T wave changes   Recent Labs: 11/15/2022: Hemoglobin 16.4; Platelets 177.0; TSH 1.300 11/30/2022: ALT 86; BUN 6; Creatinine, Ser 0.85; Potassium 4.1; Sodium 128    Lipid Panel     Component Value Date/Time   CHOL 215 (H) 11/15/2022 1630   TRIG 174.0 (H) 11/15/2022 1630   HDL 38.40 (L) 11/15/2022 1630   CHOLHDL 6 11/15/2022 1630   VLDL 34.8 11/15/2022 1630   LDLCALC 142 (H) 11/15/2022 1630      Wt Readings from Last 3 Encounters:  12/02/22 198 lb (89.8 kg)  11/15/22 197 lb 9.6 oz (89.6 kg)  10/04/22 201 lb 12.8 oz (91.5 kg)      Other studies Reviewed: Additional studies/ records that were reviewed today include: Labs. Review of the above records demonstrates:  Please see elsewhere in the note.     ASSESSMENT AND PLAN:  DIASTOLIC HF: He seems to be euvolemic.  No change in therapy.  He is up-to-date with blood work and review these.   AS/AI:   I will check an echocardiogram as it has been 4 years.   HTN:   The blood pressure is at target.  No change in therapy.    CAROTID DOPPLER:   He had no significant plaque 4 years ago.  No further imaging   Current medicines are reviewed at length with the patient today.  The patient does not have concerns regarding medicines.  The following changes have been made:  no change  Labs/ tests ordered today include:   Orders Placed This Encounter  Procedures   EKG 12-Lead   ECHOCARDIOGRAM COMPLETE     Disposition:   FU with me in one year.      Signed, Rollene Rotunda, MD  12/02/2022 4:31 PM    [facility]

## 2022-12-02 ENCOUNTER — Ambulatory Visit: Payer: PPO | Attending: Cardiology | Admitting: Cardiology

## 2022-12-02 ENCOUNTER — Encounter: Payer: Self-pay | Admitting: Cardiology

## 2022-12-02 VITALS — BP 136/60 | HR 70 | Ht 67.0 in | Wt 198.0 lb

## 2022-12-02 DIAGNOSIS — I10 Essential (primary) hypertension: Secondary | ICD-10-CM | POA: Diagnosis not present

## 2022-12-02 DIAGNOSIS — I5032 Chronic diastolic (congestive) heart failure: Secondary | ICD-10-CM | POA: Diagnosis not present

## 2022-12-02 DIAGNOSIS — I351 Nonrheumatic aortic (valve) insufficiency: Secondary | ICD-10-CM | POA: Diagnosis not present

## 2022-12-02 NOTE — Patient Instructions (Signed)
    Testing/Procedures:  Your physician has requested that you have an echocardiogram. Echocardiography is a painless test that uses sound waves to create images of your heart. It provides your doctor with information about the size and shape of your heart and how well your heart's chambers and valves are working. This procedure takes approximately one hour. There are no restrictions for this procedure. Please do NOT wear cologne, perfume, aftershave, or lotions (deodorant is allowed). Please arrive 15 minutes prior to your appointment time. [address]   Follow-Up: At [facility], you and your health needs are our priority.  As part of our continuing mission to provide you with exceptional heart care, we have created designated Provider Care Teams.  These Care Teams include your primary Cardiologist (physician) and Advanced Practice Providers (APPs -  Physician Assistants and Nurse Practitioners) who all work together to provide you with the care you need, when you need it.  We recommend signing up for the patient portal called "MyChart".  Sign up information is provided on this After Visit Summary.  MyChart is used to connect with patients for Virtual Visits (Telemedicine).  Patients are able to view lab/test results, encounter notes, upcoming appointments, etc.  Non-urgent messages can be sent to your provider as well.   To learn more about what you can do with MyChart, go to [email or web address].    Your next appointment:   12 month(s)  Provider:   Rollene Rotunda, MD

## 2022-12-29 ENCOUNTER — Other Ambulatory Visit: Payer: Self-pay | Admitting: Internal Medicine

## 2022-12-29 DIAGNOSIS — E039 Hypothyroidism, unspecified: Secondary | ICD-10-CM

## 2023-01-06 ENCOUNTER — Ambulatory Visit (HOSPITAL_COMMUNITY): Payer: PPO | Attending: Internal Medicine

## 2023-01-06 DIAGNOSIS — I351 Nonrheumatic aortic (valve) insufficiency: Secondary | ICD-10-CM | POA: Insufficient documentation

## 2023-01-06 LAB — ECHOCARDIOGRAM COMPLETE
AR max vel: 0.6 cm2
AV Area VTI: 0.59 cm2
AV Area mean vel: 0.62 cm2
AV Mean grad: 51.7 mmHg
AV Peak grad: 85 mmHg
Ao pk vel: 4.61 m/s
Area-P 1/2: 2.54 cm2
P 1/2 time: 318 msec
S' Lateral: 3.6 cm

## 2023-01-20 ENCOUNTER — Ambulatory Visit: Payer: PPO | Admitting: Internal Medicine

## 2023-02-17 ENCOUNTER — Encounter: Payer: Self-pay | Admitting: Internal Medicine

## 2023-02-17 ENCOUNTER — Ambulatory Visit (INDEPENDENT_AMBULATORY_CARE_PROVIDER_SITE_OTHER): Payer: PPO | Admitting: Internal Medicine

## 2023-02-17 VITALS — BP 118/63 | HR 66 | Temp 97.9°F | Ht 67.0 in | Wt 190.6 lb

## 2023-02-17 DIAGNOSIS — I10 Essential (primary) hypertension: Secondary | ICD-10-CM

## 2023-02-17 DIAGNOSIS — R21 Rash and other nonspecific skin eruption: Secondary | ICD-10-CM

## 2023-02-17 DIAGNOSIS — R5383 Other fatigue: Secondary | ICD-10-CM | POA: Diagnosis not present

## 2023-02-17 DIAGNOSIS — R269 Unspecified abnormalities of gait and mobility: Secondary | ICD-10-CM

## 2023-02-17 DIAGNOSIS — R270 Ataxia, unspecified: Secondary | ICD-10-CM

## 2023-02-17 DIAGNOSIS — E871 Hypo-osmolality and hyponatremia: Secondary | ICD-10-CM | POA: Diagnosis not present

## 2023-02-17 DIAGNOSIS — R413 Other amnesia: Secondary | ICD-10-CM

## 2023-02-17 DIAGNOSIS — K219 Gastro-esophageal reflux disease without esophagitis: Secondary | ICD-10-CM | POA: Diagnosis not present

## 2023-02-17 DIAGNOSIS — Z789 Other specified health status: Secondary | ICD-10-CM

## 2023-02-17 DIAGNOSIS — D500 Iron deficiency anemia secondary to blood loss (chronic): Secondary | ICD-10-CM | POA: Diagnosis not present

## 2023-02-17 DIAGNOSIS — F1090 Alcohol use, unspecified, uncomplicated: Secondary | ICD-10-CM

## 2023-02-17 DIAGNOSIS — K588 Other irritable bowel syndrome: Secondary | ICD-10-CM

## 2023-02-17 DIAGNOSIS — I5032 Chronic diastolic (congestive) heart failure: Secondary | ICD-10-CM | POA: Diagnosis not present

## 2023-02-17 DIAGNOSIS — J370 Chronic laryngitis: Secondary | ICD-10-CM

## 2023-02-17 DIAGNOSIS — C61 Malignant neoplasm of prostate: Secondary | ICD-10-CM

## 2023-02-17 DIAGNOSIS — E782 Mixed hyperlipidemia: Secondary | ICD-10-CM | POA: Diagnosis not present

## 2023-02-17 DIAGNOSIS — I779 Disorder of arteries and arterioles, unspecified: Secondary | ICD-10-CM

## 2023-02-17 DIAGNOSIS — R200 Anesthesia of skin: Secondary | ICD-10-CM

## 2023-02-17 DIAGNOSIS — M79604 Pain in right leg: Secondary | ICD-10-CM

## 2023-02-17 DIAGNOSIS — E039 Hypothyroidism, unspecified: Secondary | ICD-10-CM

## 2023-02-17 DIAGNOSIS — Z8546 Personal history of malignant neoplasm of prostate: Secondary | ICD-10-CM | POA: Diagnosis not present

## 2023-02-17 DIAGNOSIS — I872 Venous insufficiency (chronic) (peripheral): Secondary | ICD-10-CM

## 2023-02-17 DIAGNOSIS — E063 Autoimmune thyroiditis: Secondary | ICD-10-CM

## 2023-02-17 DIAGNOSIS — K635 Polyp of colon: Secondary | ICD-10-CM

## 2023-02-17 DIAGNOSIS — R221 Localized swelling, mass and lump, neck: Secondary | ICD-10-CM

## 2023-02-17 NOTE — Progress Notes (Unsigned)
[facility]: [phone]   Routine Medical Office Visit  Patient:  Scott Newman      Age: 77 y.o.       Sex:  male  Date:   02/17/2023 Patient Care Team: [name], MD as PCP - General (Internal Medicine) Rollene Rotunda, MD as PCP - Cardiology (Cardiology) Vida Rigger, MD as Consulting Physician (Gastroenterology) Today's Healthcare Provider: [name], MD   Assessment and Plan:   Ehab was seen today for 3 month follow-up.  Mixed hyperlipidemia Overview: Qualifier: Diagnosis of  By: Cristela Felt, CNA, Christy    Orders: -     Lipid panel  Malignant neoplasm of prostate (HCC) Overview: Had 40 radiation treatment(s) in 2016 November Had radiation proctitis and bleeding later resolved with sigmoidoscopic rfa and never returned  Orders: -     PSA  Essential hypertension Overview: Statin myopathy history: no uses red yeast rice with coq10 Current Regimen: Diet and exercise   Lab Results  Component Value Date   CHOL  06/23/2010    153        ATP III CLASSIFICATION:  <200     mg/dL   Desirable  324-401  mg/dL   Borderline High  >=027    mg/dL   High          Lab Results  Component Value Date   HDL 25 (L) 06/23/2010   Lab Results  Component Value Date   LDLCALC (H) 06/23/2010    107        Total Cholesterol/HDL:CHD Risk Coronary Heart Disease Risk Table                     Men   Women  1/2 Average Risk   3.4   3.3  Average Risk       5.0   4.4  2 X Average Risk   9.6   7.1  3 X Average Risk  23.4   11.0        Use the calculated Patient Ratio above and the CHD Risk Table to determine the patient's CHD Risk.        ATP III CLASSIFICATION (LDL):  <100     mg/dL   Optimal  253-664  mg/dL   Near or Above                    Optimal  130-159  mg/dL   Borderline  403-474  mg/dL   High  >259     mg/dL   Very High   Lab Results  Component Value Date   TRIG 107 06/23/2010   Lab Results  Component Value Date    CHOLHDL 6.1 06/23/2010   No results found for: "LDLDIRECT"  The ASCVD Risk score (Arnett DK, et al., 2019) failed to calculate for the following reasons:   Cannot find a previous HDL lab   Cannot find a previous total cholesterol lab    Neck mass  Chronic diastolic heart failure (HCC) Overview: Cardiologist Rollene Rotunda, MD Has had dermatitis on legs associated with this vs chronic venous insufficiency   Orders: -     CP4508-PT/INR AND PTT  Other fatigue -     CP4508-PT/INR AND PTT -     TSH -     Comprehensive metabolic panel -     CBC with Differential/Platelet -     VITAMIN D 25 Hydroxy (Vit-D Deficiency, Fractures) -     B12 and Folate  Panel  Serum sodium decreased  Venous (peripheral) insufficiency Overview: Chronic venous insufficiency but vein center evaluation 2023 didn't find evidence for this 2023 but has chronic venous stasis dermatitis - we need dermatology consult note    Rash and nonspecific skin eruption  Hypothyroidism, acquired Overview: Interim history: fatigue  Patient reports compliance with levothyroxine daily Patient expresses that there is no prior workup history - curious about hashimotos  Lab Results  Component Value Date   TSH 26.320 (H) 07/29/2014   Thyroperoxidase Ab SerPl-aCnc 0 - 34 IU/mL 43 High    Thyroglobulin Antibody 0.0 - 0.9 IU/mL 4.7 High     Anti-TSI antibodies: N/A    Family history of thyroid disease: none  Patient last evaluated for thyroid nodules: never had Last ultrasound for thyroid nodules:  never had  Last heart exam for rhythm check:  following with Hochrein   Orders: -     TSH -     T4, free -     Osmolality  Leg pain, bilateral  Ataxia  Alcohol use Overview: Interim history: Drinks wine and light beer     Gait difficulty Overview: Walks with cane long term   Orders: -     CBC with Differential/Platelet -     VITAMIN D 25 Hydroxy (Vit-D Deficiency, Fractures) -     B12 and Folate  Panel  Iron deficiency anemia due to chronic blood loss -     Iron, TIBC and Ferritin Panel  Laryngopharyngeal reflux (LPR)  Other irritable bowel syndrome Overview: Has corrected in past with low dose linzess.  Persistent since April 2023 colonoscopy     History of prostate cancer -     PSA  Hyponatremia -     Osmolality -     Chloride, urine, random -     Urine Sodium & Potassium -     ACTH -     Cortisol -     Osmolality, urine -     Sodium, urine, random -     Aldosterone + renin activity w/ ratio -     Cortisol  Memory impairment -     Ambulatory referral to Neurology  Congestion of larynx  Other orders -     Aldosterone + renin activity w/ ratio     Key Discussion Points for Today: Assessment and Plan              Assessment & Plan Mixed hyperlipidemia  Malignant neoplasm of prostate (HCC)  Essential hypertension  Neck mass  Chronic diastolic heart failure (HCC)  Other fatigue  Serum sodium decreased  Venous (peripheral) insufficiency  Rash and nonspecific skin eruption  Hypothyroidism, acquired  Leg pain, bilateral  Ataxia  Alcohol use  Gait difficulty  Iron deficiency anemia due to chronic blood loss  Laryngopharyngeal reflux (LPR)  Other irritable bowel syndrome  History of prostate cancer  Hyponatremia  Memory impairment  Congestion of larynx     ED Discharge Orders          Ordered    Aldosterone + renin activity w/ ratio        02/17/23 1633    CP4508-PT/INR AND PTT        02/17/23 1629    Lipid panel       Comments: Specimen [id number] B: [place]    02/17/23 1629    TSH        02/17/23 1629    Comprehensive metabolic panel        02/17/23 1629  CBC with Differential/Platelet        02/17/23 1629    Vitamin D (25 hydroxy)        02/17/23 1629    B12 and Folate Panel        02/17/23 1629    PSA        02/17/23 1629    T4, free        02/17/23 1629    Osmolality        02/17/23 1629     Iron, TIBC and Ferritin Panel        02/17/23 1629    Chloride, urine, random        02/17/23 1632    Urine Sodium & Potassium        02/17/23 1632    ACTH        02/17/23 1632    Aldosterone + renin activity w/ ratio  Status:  Canceled        02/17/23 1632    Cortisol        02/17/23 1632    Osmolality, urine        02/17/23 1632    Sodium, urine, random        02/17/23 1632    Ambulatory referral to Neurology        02/17/23 1638    Aldosterone + renin activity w/ ratio        02/17/23 1640    Cortisol        02/17/23 1641    Ambulatory referral to ENT        Pending            Recommended follow up: No follow-ups on file.  Future Appointments  Date Time Provider Department Center  03/10/2023  3:00 PM [name], MD [facility]  05/17/2023  4:20 PM Rollene Rotunda, MD [facility] None  10/06/2023  3:30 PM [facility] ANNUAL WELLNESS VISIT 1 [facility]           Clinical Presentation:    77 y.o. male who has Hyperlipidemia; Alcohol use; Essential hypertension; Chronic diastolic heart failure (HCC); Venous (peripheral) insufficiency; Colon polyps; Gait difficulty; Numbness; Malignant neoplasm of prostate (HCC); Hypothyroidism, acquired; Anemia, iron deficiency; Bilateral carotid artery disease (HCC); History of prostate cancer; Laryngopharyngeal reflux (LPR); Branchial cleft cyst; Pharyngoesophageal dysphagia; Penicillin allergy; and IBS (irritable bowel syndrome) on their problem list. His reasons/main concerns/chief complaints for today's office visit are 3 month follow-up   AI-Extracted: Discussed the use of AI scribe software for clinical note transcription with the patient, who gave verbal consent to proceed.  History of Present Illness               He  has a past medical history of Aortic stenosis, Arthritis, Cataract, Cellulitis (07/14/2022), Chronic diastolic heart failure (HCC), Colon polyp, Depression, Difficulty swallowing, Gait abnormality, GERD  (gastroesophageal reflux disease), GI bleeding (03/10/2017), Hernia, History of alcohol abuse, History of cellulitis and abscess, HTN (hypertension), Hyperlipidemia, Hypothyroidism, Neck mass (06/15/2016), Prostate cancer (HCC) (nov 2016 jan 2017), Rash and nonspecific skin eruption (07/14/2022), Rash and nonspecific skin eruption (07/14/2022), Salmonella (age 12), Screening for AAA (abdominal aortic aneurysm) (06/07/2017), and Thyroid disease.  Problem overviews that were updated today: No problems updated.  Current Outpatient Medications on File Prior to Visit  Medication Sig  . b complex vitamins tablet Take 1 tablet by mouth daily as needed (energy).   . celecoxib (CELEBREX) 100 MG capsule Take 1 capsule (100 mg total) by mouth 2 (two)  times daily.  . Cholecalciferol (VITAMIN D) 2000 units CAPS Take 2,000-4,000 Units by mouth daily.   . Chromium Picolinate (CHROMIUM PICOLATE PO) Take 1,000 mcg by mouth daily.  . Coenzyme Q10 (COQ10) 100 MG CAPS Take 100-200 mg by mouth daily as needed (chest weakness).  Marland Kitchen levothyroxine (SYNTHROID) 50 MCG tablet TAKE 1 TABLET BY MOUTH EVERY DAY  . linaclotide (LINZESS) 145 MCG CAPS capsule Take 1 capsule (145 mcg total) by mouth daily before breakfast.  . lisinopril (ZESTRIL) 10 MG tablet Take 10 mg by mouth daily.  Marland Kitchen LORazepam (ATIVAN) 0.5 MG tablet Take 0.0625-0.125 mg by mouth daily as needed for anxiety or sleep.   . Lutein 20 MG CAPS Take 20 mg by mouth daily.  Marland Kitchen MAGNESIUM-POTASSIUM PO Take 1 tablet by mouth daily.  . Tetrahydrozoline HCl (VISINE OP) Apply 1 drop to eye daily as needed (dry eyes).   No current facility-administered medications on file prior to visit.   There are no discontinued medications.       Clinical Data Analysis:   Physical Exam  BP 118/63 (BP Location: Left Arm, Patient Position: Sitting)   Pulse 66   Temp 97.9 F (36.6 C) (Temporal)   Ht 5\' 7"  (1.702 m)   Wt 190 lb 9.6 oz (86.5 kg)   SpO2 93%   BMI 29.85 kg/m  Wt  Readings from Last 10 Encounters:  02/17/23 190 lb 9.6 oz (86.5 kg)  12/02/22 198 lb (89.8 kg)  11/15/22 197 lb 9.6 oz (89.6 kg)  10/04/22 201 lb 12.8 oz (91.5 kg)  08/16/22 200 lb 9.6 oz (91 kg)  07/14/22 207 lb 12.8 oz (94.3 kg)  06/26/18 207 lb (93.9 kg)  11/01/17 203 lb 6.4 oz (92.3 kg)  06/07/17 180 lb 6.4 oz (81.8 kg)  05/10/17 176 lb (79.8 kg)   Vital signs reviewed.  Nursing notes reviewed. Weight trend reviewed. Abnormalities and Problem-Specific physical exam findings:  ***  General Appearance:  No acute distress appreciable.   Well-groomed, healthy-appearing male.  Well proportioned with no abnormal fat distribution.  Good muscle tone. Skin: Clear and well-hydrated. Pulmonary:  Normal work of breathing at rest, no respiratory distress apparent. SpO2: 93 %  Musculoskeletal: All extremities are intact.  Neurological:  Awake, alert, oriented, and engaged.  No obvious focal neurological deficits or cognitive impairments.  Sensorium seems unclouded.   Speech is clear and coherent with logical content. Psychiatric:  Appropriate mood, pleasant and cooperative demeanor, thoughtful and engaged during the exam  Results Reviewed:  {Insert previous labs (optional):23779} {See past labs  Heme  Chem  Endocrine  Serology  Results Review (optional):1}  Results for orders placed or performed in visit on 02/17/23  CP4508-PT/INR AND PTT  Result Value Ref Range   aPTT 30 23 - 32 sec   INR 1.1    Prothrombin Time 11.4 9.0 - 11.5 sec  Lipid panel  Result Value Ref Range   Cholesterol 201 (H) <200 mg/dL   HDL 39 (L) > OR = 40 mg/dL   Triglycerides 562 (H) <150 mg/dL   LDL Cholesterol (Calc) 131 (H) mg/dL (calc)   Total CHOL/HDL Ratio 5.2 (H) <5.0 (calc)   Non-HDL Cholesterol (Calc) 162 (H) <130 mg/dL (calc)  TSH  Result Value Ref Range   TSH 1.22 0.40 - 4.50 mIU/L  Comprehensive metabolic panel  Result Value Ref Range   Glucose, Bld 125 (H) 65 - 99 mg/dL   BUN 9 7 - 25 mg/dL    Creat 1.30 8.65 -  1.28 mg/dL   BUN/Creatinine Ratio SEE NOTE: 6 - 22 (calc)   Sodium 128 (L) 135 - 146 mmol/L   Potassium 4.0 3.5 - 5.3 mmol/L   Chloride 95 (L) 98 - 110 mmol/L   CO2 26 20 - 32 mmol/L   Calcium 9.4 8.6 - 10.3 mg/dL   Total Protein 7.4 6.1 - 8.1 g/dL   Albumin 4.3 3.6 - 5.1 g/dL   Globulin 3.1 1.9 - 3.7 g/dL (calc)   AG Ratio 1.4 1.0 - 2.5 (calc)   Total Bilirubin 1.3 (H) 0.2 - 1.2 mg/dL   Alkaline phosphatase (APISO) 72 35 - 144 U/L   AST 43 (H) 10 - 35 U/L   ALT 41 9 - 46 U/L  CBC with Differential/Platelet  Result Value Ref Range   WBC 7.4 3.8 - 10.8 Thousand/uL   RBC 4.38 4.20 - 5.80 Million/uL   Hemoglobin 16.0 13.2 - 17.1 g/dL   HCT 16.1 09.6 - 04.5 %   MCV 103.7 (H) 80.0 - 100.0 fL   MCH 36.5 (H) 27.0 - 33.0 pg   MCHC 35.2 32.0 - 36.0 g/dL   RDW 40.9 81.1 - 91.4 %   Platelets 158 140 - 400 Thousand/uL   MPV 10.6 7.5 - 12.5 fL   Neutro Abs 5,535 1,500 - 7,800 cells/uL   Lymphs Abs 851 850 - 3,900 cells/uL   Absolute Monocytes 747 200 - 950 cells/uL   Eosinophils Absolute 200 15 - 500 cells/uL   Basophils Absolute 67 0 - 200 cells/uL   Neutrophils Relative % 74.8 %   Total Lymphocyte 11.5 %   Monocytes Relative 10.1 %   Eosinophils Relative 2.7 %   Basophils Relative 0.9 %  Vitamin D (25 hydroxy)  Result Value Ref Range   Vit D, 25-Hydroxy 44 30 - 100 ng/mL  B12 and Folate Panel  Result Value Ref Range   Vitamin B-12 538 200 - 1,100 pg/mL   Folate 6.0 ng/mL  PSA  Result Value Ref Range   PSA <0.04 < OR = 4.00 ng/mL  T4, free  Result Value Ref Range   Free T4 1.2 0.8 - 1.8 ng/dL  Iron, TIBC and Ferritin Panel  Result Value Ref Range   Iron 177 50 - 180 mcg/dL   TIBC 782 956 - 213 mcg/dL (calc)   %SAT 54 (H) 20 - 48 % (calc)   Ferritin 154 24 - 380 ng/mL  Chloride, urine, random  Result Value Ref Range   Chloride Urine <20 (L) 32 - 290 mmol/L  Sodium, urine, random  Result Value Ref Range   Sodium, Ur 12 (L) 28 - 272 mmol/L  Cortisol   Result Value Ref Range   Cortisol, Plasma 17.4 mcg/dL    Office Visit on 08/65/7846  Component Date Value  . aPTT 02/17/2023 30   . INR 02/17/2023 1.1   . Prothrombin Time 02/17/2023 11.4   . Cholesterol 02/17/2023 201 (H)   . HDL 02/17/2023 39 (L)   . Triglycerides 02/17/2023 174 (H)   . LDL Cholesterol (Calc) 02/17/2023 131 (H)   . Total CHOL/HDL Ratio 02/17/2023 5.2 (H)   . Non-HDL Cholesterol (Cal* 02/17/2023 162 (H)   . TSH 02/17/2023 1.22   . Glucose, Bld 02/17/2023 125 (H)   . BUN 02/17/2023 9   . Creat 02/17/2023 0.72   . BUN/Creatinine Ratio 02/17/2023 SEE NOTE:   . Sodium 02/17/2023 128 (L)   . Potassium 02/17/2023 4.0   . Chloride 02/17/2023 95 (L)   .  CO2 02/17/2023 26   . Calcium 02/17/2023 9.4   . Total Protein 02/17/2023 7.4   . Albumin 02/17/2023 4.3   . Globulin 02/17/2023 3.1   . AG Ratio 02/17/2023 1.4   . Total Bilirubin 02/17/2023 1.3 (H)   . Alkaline phosphatase (AP* 02/17/2023 72   . AST 02/17/2023 43 (H)   . ALT 02/17/2023 41   . WBC 02/17/2023 7.4   . RBC 02/17/2023 4.38   . Hemoglobin 02/17/2023 16.0   . HCT 02/17/2023 45.4   . MCV 02/17/2023 103.7 (H)   . MCH 02/17/2023 36.5 (H)   . MCHC 02/17/2023 35.2   . RDW 02/17/2023 11.9   . Platelets 02/17/2023 158   . MPV 02/17/2023 10.6   . Neutro Abs 02/17/2023 5,535   . Lymphs Abs 02/17/2023 851   . Absolute Monocytes 02/17/2023 747   . Eosinophils Absolute 02/17/2023 200   . Basophils Absolute 02/17/2023 67   . Neutrophils Relative % 02/17/2023 74.8   . Total Lymphocyte 02/17/2023 11.5   . Monocytes Relative 02/17/2023 10.1   . Eosinophils Relative 02/17/2023 2.7   . Basophils Relative 02/17/2023 0.9   . Vit D, 25-Hydroxy 02/17/2023 44   . Vitamin B-12 02/17/2023 538   . Folate 02/17/2023 6.0   . PSA 02/17/2023 <0.04   . Free T4 02/17/2023 1.2   . Iron 02/17/2023 177   . TIBC 02/17/2023 328   . %SAT 02/17/2023 54 (H)   . Ferritin 02/17/2023 154   . Chloride Urine 02/17/2023 <20 (L)    . Sodium, Ur 02/17/2023 12 (L)   . Cortisol, Plasma 02/17/2023 17.4   Appointment on 01/06/2023  Component Date Value  . Area-P 1/2 01/06/2023 2.54   . S' Lateral 01/06/2023 3.60   . AV Area mean vel 01/06/2023 0.62   . AR max vel 01/06/2023 0.60   . AV Area VTI 01/06/2023 0.59   . P 1/2 time 01/06/2023 318   . Ao pk vel 01/06/2023 4.61   . AV Mean grad 01/06/2023 51.7   . AV Peak grad 01/06/2023 85.0   . Est EF 01/06/2023 55 - 60%   Lab on 11/30/2022  Component Date Value  . Sodium 11/30/2022 128 (L)   . Potassium 11/30/2022 4.1   . Chloride 11/30/2022 92 (L)   . CO2 11/30/2022 28   . Glucose, Bld 11/30/2022 156 (H)   . BUN 11/30/2022 6   . Creatinine, Ser 11/30/2022 0.85   . Total Bilirubin 11/30/2022 1.7 (H)   . Alkaline Phosphatase 11/30/2022 85   . AST 11/30/2022 89 (H)   . ALT 11/30/2022 86 (H)   . Total Protein 11/30/2022 7.3   . Albumin 11/30/2022 4.2   . GFR 11/30/2022 83.29   . Calcium 11/30/2022 9.5   . Vitamin B-12 11/30/2022 584   . Folate 11/30/2022 6.9   . Hgb A1c MFr Bld 11/30/2022 5.8   Office Visit on 11/15/2022  Component Date Value  . Cholesterol 11/15/2022 215 (H)   . Triglycerides 11/15/2022 174.0 (H)   . HDL 11/15/2022 38.40 (L)   . VLDL 11/15/2022 34.8   . LDL Cholesterol 11/15/2022 142 (H)   . Total CHOL/HDL Ratio 11/15/2022 6   . NonHDL 11/15/2022 176.88   . Sodium 11/15/2022 127 (L)   . Potassium 11/15/2022 4.5   . Chloride 11/15/2022 91 (L)   . CO2 11/15/2022 28   . Glucose, Bld 11/15/2022 140 (H)   . BUN 11/15/2022 11   .  Creatinine, Ser 11/15/2022 0.90   . Total Bilirubin 11/15/2022 1.7 (H)   . Alkaline Phosphatase 11/15/2022 86   . AST 11/15/2022 118 (H)   . ALT 11/15/2022 120 (H)   . Total Protein 11/15/2022 7.6   . Albumin 11/15/2022 4.4   . GFR 11/15/2022 81.89   . Calcium 11/15/2022 9.6   . WBC 11/15/2022 7.9   . RBC 11/15/2022 4.39   . Hemoglobin 11/15/2022 16.4   . HCT 11/15/2022 46.4   . MCV 11/15/2022 105.7 (H)    . MCHC 11/15/2022 35.4   . RDW 11/15/2022 12.6   . Platelets 11/15/2022 177.0   . Neutrophils Relative % 11/15/2022 76.0   . Lymphocytes Relative 11/15/2022 9.7 (L)   . Monocytes Relative 11/15/2022 10.7   . Eosinophils Relative 11/15/2022 2.3   . Basophils Relative 11/15/2022 1.3   . Neutro Abs 11/15/2022 6.0   . Lymphs Abs 11/15/2022 0.8   . Monocytes Absolute 11/15/2022 0.8   . Eosinophils Absolute 11/15/2022 0.2   . Basophils Absolute 11/15/2022 0.1   . Vit D, 25-Hydroxy 11/15/2022 41.7   . TSH 11/15/2022 1.300   . T3, Free 11/15/2022 2.3   . Free T4 11/15/2022 1.31   . Thyroperoxidase Ab SerPl* 11/15/2022 43 (H)   . Thyroglobulin Antibody 11/15/2022 4.7 (H)    No image results found.   ECHOCARDIOGRAM COMPLETE  Result Date: 01/06/2023    ECHOCARDIOGRAM REPORT   Patient Name:   BRYER ROLLI Date of Exam: 01/06/2023 Medical Rec #:  [id number]        Height:       67.0 in Accession #:    [id number]       Weight:       198.0 lb Date of Birth:  [birth year]       BSA:          2.014 m Patient Age:    76 years         BP:           136/60 mmHg Patient Gender: M                HR:           74 bpm. Exam Location:  [address] Procedure: 2D Echo, Cardiac Doppler and Color Doppler Indications:    I35.0 AS  History:        Patient has prior history of Echocardiogram examinations, most                 recent 03/11/2017. CHF, H/o alcohol abuse, AS/AI; Risk                 Factors:Hypertension and Dyslipidemia.  Sonographer:    Samule Ohm RDCS Referring Phys: [id number] JAMES HOCHREIN IMPRESSIONS  1. Left ventricular ejection fraction, by estimation, is 55 to 60%. The left ventricle has normal function. The left ventricle has no regional wall motion abnormalities. There is mild left ventricular hypertrophy. Left ventricular diastolic parameters are indeterminate.  2. Right ventricular systolic function is normal. The right ventricular size is normal. Tricuspid regurgitation signal is inadequate for  assessing PA pressure.  3. Left atrial size was moderately dilated.  4. The mitral valve is degenerative. Mild mitral valve regurgitation. Severe mitral annular calcification.  5. Mean gradient 51 mmHg, AVA 0.59 cm2, DI 0.19. The aortic valve is calcified. Aortic valve regurgitation is moderate to severe. Severe aortic valve stenosis.  6. The inferior vena cava is normal in size with greater  than 50% respiratory variability, suggesting right atrial pressure of 3 mmHg. Conclusion(s)/Recommendation(s): AS is severe/ mod-severe AI, consider CT / structural consult. FINDINGS  Left Ventricle: Left ventricular ejection fraction, by estimation, is 55 to 60%. The left ventricle has normal function. The left ventricle has no regional wall motion abnormalities. The left ventricular internal cavity size was normal in size. There is  mild left ventricular hypertrophy. Left ventricular diastolic parameters are indeterminate. Right Ventricle: The right ventricular size is normal. Right ventricular systolic function is normal. Tricuspid regurgitation signal is inadequate for assessing PA pressure. Left Atrium: Left atrial size was moderately dilated. Right Atrium: Right atrial size was normal in size. Pericardium: There is no evidence of pericardial effusion. Mitral Valve: The mitral valve is degenerative in appearance. Severe mitral annular calcification. Mild mitral valve regurgitation. Tricuspid Valve: Tricuspid valve regurgitation is not demonstrated. Aortic Valve: Mean gradient 51 mmHg, AVA 0.59 cm2, DI 0.19. The aortic valve is calcified. Aortic valve regurgitation is moderate to severe. Aortic regurgitation PHT measures 318 msec. Severe aortic stenosis is present. Aortic valve mean gradient measures 51.7 mmHg. Aortic valve peak gradient measures 85.0 mmHg. Aortic valve area, by VTI measures 0.59 cm. Pulmonic Valve: Pulmonic valve regurgitation is not visualized. Aorta: The aortic root and ascending aorta are structurally  normal, with no evidence of dilitation. Venous: The inferior vena cava is normal in size with greater than 50% respiratory variability, suggesting right atrial pressure of 3 mmHg. IAS/Shunts: No atrial level shunt detected by color flow Doppler.  LEFT VENTRICLE PLAX 2D LVIDd:         4.95 cm   Diastology LVIDs:         3.60 cm   LV e' medial:    5.00 cm/s LV PW:         1.20 cm   LV E/e' medial:  35.0 LV IVS:        1.30 cm   LV e' lateral:   9.90 cm/s LVOT diam:     2.00 cm   LV E/e' lateral: 17.7 LV SV:         66 LV SV Index:   33 LVOT Area:     3.14 cm  RIGHT VENTRICLE             IVC RV S prime:     10.40 cm/s  IVC diam: 1.30 cm TAPSE (M-mode): 1.8 cm LEFT ATRIUM           Index        RIGHT ATRIUM           Index LA diam:      4.80 cm 2.38 cm/m   RA Pressure: 3.00 mmHg LA Vol (A2C): 55.6 ml 27.61 ml/m  RA Area:     12.80 cm LA Vol (A4C): 78.9 ml 39.18 ml/m  RA Volume:   29.30 ml  14.55 ml/m  AORTIC VALVE AV Area (Vmax):    0.60 cm AV Area (Vmean):   0.62 cm AV Area (VTI):     0.59 cm AV Vmax:           461.00 cm/s AV Vmean:          334.667 cm/s AV VTI:            1.110 m AV Peak Grad:      85.0 mmHg AV Mean Grad:      51.7 mmHg LVOT Vmax:         88.60 cm/s LVOT Vmean:  66.500 cm/s LVOT VTI:          0.210 m LVOT/AV VTI ratio: 0.19 AI PHT:            318 msec  AORTA Ao Root diam: 3.20 cm Ao Asc diam:  3.30 cm MITRAL VALVE                TRICUSPID VALVE MV Area (PHT): 2.54 cm     Estimated RAP:  3.00 mmHg MV Decel Time: 299 msec MV E velocity: 175.00 cm/s  SHUNTS MV A velocity: 57.20 cm/s   Systemic VTI:  0.21 m MV E/A ratio:  3.06         Systemic Diam: 2.00 cm Carolan Clines Electronically signed by Carolan Clines Signature Date/Time: 01/06/2023/3:58:00 PM    Final     US Venous Img Lower Unilateral Left (DVT)  Result Date: 11/10/2021 CLINICAL DATA:  Left lower extremity pain and edema for 1 week EXAM: LEFT LOWER EXTREMITY VENOUS DOPPLER ULTRASOUND TECHNIQUE: Gray-scale sonography with  compression, as well as color and duplex ultrasound, were performed to evaluate the deep venous system(s) from the level of the common femoral vein through the popliteal and proximal calf veins. COMPARISON:  None Available. FINDINGS: VENOUS Normal compressibility of the common femoral, superficial femoral, and popliteal veins, as well as the visualized calf veins. Visualized portions of profunda femoral vein and great saphenous vein unremarkable. No filling defects to suggest DVT on grayscale or color Doppler imaging. Doppler waveforms show normal direction of venous flow, normal respiratory plasticity and response to augmentation. Limited views of the contralateral common femoral vein are unremarkable. OTHER None. Limitations: none IMPRESSION: No left lower extremity DVT. Electronically Signed   By: Acquanetta Belling M.D.   On: 11/10/2021 14:46      This encounter employed real-time, collaborative documentation. The patient actively reviewed and updated their medical record on a shared screen, ensuring transparency and facilitating joint problem-solving for the problem list, overview, and plan. This approach promotes accurate, informed care. The treatment plan was discussed and reviewed in detail, including medication safety, potential side effects, and all patient questions. We confirmed understanding and comfort with the plan. Follow-up instructions were established, including contacting the office for any concerns, returning if symptoms worsen, persist, or new symptoms develop, and precautions for potential emergency department visits. ----------------------------------------------------- [name], MD  02/18/2023 3:34 PM  [facility]:  [phone]

## 2023-02-19 ENCOUNTER — Encounter: Payer: Self-pay | Admitting: Hematology

## 2023-02-19 ENCOUNTER — Encounter: Payer: Self-pay | Admitting: Internal Medicine

## 2023-02-19 DIAGNOSIS — R270 Ataxia, unspecified: Secondary | ICD-10-CM | POA: Insufficient documentation

## 2023-02-19 DIAGNOSIS — R413 Other amnesia: Secondary | ICD-10-CM | POA: Insufficient documentation

## 2023-02-19 DIAGNOSIS — R5383 Other fatigue: Secondary | ICD-10-CM | POA: Insufficient documentation

## 2023-02-19 DIAGNOSIS — J370 Chronic laryngitis: Secondary | ICD-10-CM | POA: Insufficient documentation

## 2023-02-19 DIAGNOSIS — E871 Hypo-osmolality and hyponatremia: Secondary | ICD-10-CM | POA: Insufficient documentation

## 2023-02-19 NOTE — Assessment & Plan Note (Signed)
Will do comprehensive blood work to examine for his chronic fatigue of uncertain probably multifactorial etiology

## 2023-02-19 NOTE — Assessment & Plan Note (Addendum)
This remains persistent and I added fall risk to his problem list as a result not certain why he needs the cane it suspect related to liver is causing the cognitive slowing probably multifactorial I try to get chronic care management to support and physical therapy his wife cares for well though

## 2023-02-19 NOTE — Assessment & Plan Note (Signed)
Working this up primarily as a sodium and alcohol related problem but I do encourage vitamins and I was able to get him to agree to a neurology referral to evaluate the issue in more detail as it is complex and he is not currently feeling like this is even a real problem is that he is rather mild

## 2023-02-19 NOTE — Assessment & Plan Note (Addendum)
This is associated with some congestion in his throat that constantly bothers him I encouraged him to see ear nose and throat and for this reason referred him to new ear nose and throat physician to confirm that the sense of congestion in his throat is linked.  He also follow-up with GI at [facility] but did not want me to refer back to it this time as he said they do not have the right equipment to dilate his throat and esophagus we talked about maybe doing salt water gargles or Listerine gargles to try to reduce the unpleasant sensation of throat congestion related to this he does not really feel the reflux so it is a possible missed diagnosis but he has been told this by prior ear nose and throat I added the throat congestion problem as well separately since its uncertain

## 2023-02-19 NOTE — Assessment & Plan Note (Signed)
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

## 2023-02-19 NOTE — Assessment & Plan Note (Signed)
Continue with Linzess this was not a focus of discussion today

## 2023-02-19 NOTE — Assessment & Plan Note (Signed)
This is one of his main complaints is chronic and persistent and if you look at my analysis of the laryngeal pharyngeal reflux assessment and plan you will say most of my thoughts on it I did try get him to do gargles.  He is reporting that vitamin C homeopathically works which I encouraged to continue as well.

## 2023-02-19 NOTE — Assessment & Plan Note (Signed)
I encouraged patient to discontinue alcohol it may be related to his low sodium and also can contribute to dementia I am concerned about some cognitive slowing

## 2023-02-19 NOTE — Assessment & Plan Note (Signed)
Outstanding blood pressure for a while now I have no concerns on that continue with current management

## 2023-02-19 NOTE — Assessment & Plan Note (Signed)
This is probably a major contributor to his chronic fatigue and cognitive impairment and we need to work into it and do closer follow-up although it has been a chronic.  I encouraged him to eat more salt limit fluid and follow-up with me in a month and we will also get additional lab work to check the osmolality to determine the source but he does take an NSAID have a history of heart failure and there is alcohol use all of which are suspicious for contributors also there is thyroid disease with active Hashimoto's

## 2023-02-19 NOTE — Assessment & Plan Note (Addendum)
He does not take statin or aspirin despite very high 10-year cardiovascular risk we will recheck cholesterol and try to get him to start taking it on the follow-up

## 2023-02-19 NOTE — Assessment & Plan Note (Signed)
This has been stable continue with using cane

## 2023-02-19 NOTE — Assessment & Plan Note (Signed)
Also follows with [name] but is not planning on returning at this time although I encouraged him to do so

## 2023-02-19 NOTE — Assessment & Plan Note (Signed)
This seems to have dramatically improved since the last visit and his legs have markedly healed

## 2023-02-19 NOTE — Assessment & Plan Note (Addendum)
Will resolve this problem as there has not been any issue for a while but with severe in 2018 unfortunately it is tied to darbepoetin orders that are still active for some reason and I cannot cancel it out

## 2023-02-19 NOTE — Patient Instructions (Signed)
It was a pleasure seeing you today! Your health and satisfaction are our top priorities.  Scott Hew, MD  Your Providers PCP: [name], MD,  [phone]) Referring Provider: [name], MD,  [phone]) Care Team Provider: Rollene Rotunda, MD,  [phone]) Care Team Provider: Vida Rigger, MD,  [phone])     NEXT STEPS: [x]  Early Intervention: Schedule sooner appointment, call our on-call services, or go to emergency room if there is any significant Increase in pain or discomfort New or worsening symptoms Sudden or severe changes in your health [x]  Flexible Follow-Up: We recommend a Return in about 1 month (around 03/20/2023). for optimal routine care. This allows for progress monitoring and treatment adjustments. [x]  Preventive Care: Schedule your annual preventive care visit! It's typically covered by insurance and helps identify potential health issues early. [x]  Lab & X-ray Appointments: Incomplete tests scheduled today, or call to schedule. X-rays: [facility] (M-F, 8:30am-noon or 1pm-5pm). [x]  Medical Information Release: Sign a release form at front desk to obtain relevant medical information we don't have.  MAKING THE MOST OF OUR FOCUSED 20 MINUTE APPOINTMENTS: [x]   Clearly state your top concerns at the beginning of the visit to focus our discussion [x]   If you anticipate you will need more time, please inform the front desk during scheduling - we can book multiple appointments in the same week. [x]   If you have transportation problems- use our convenient video appointments or ask about transportation support. [x]   We can get down to business faster if you use MyChart to update information before the visit and submit non-urgent questions before your visit. Thank you for taking the time to provide details through MyChart.  Let our nurse know and she can import this information into your encounter documents.  Arrival and Wait Times: [x]    Arriving on time ensures that everyone receives prompt attention. [x]   Early morning (8a) and afternoon (1p) appointments tend to have shortest wait times. [x]   Unfortunately, we cannot delay appointments for late arrivals or hold slots during phone calls.  Getting Answers and Following Up [x]   Simple Questions & Concerns: For quick questions or basic follow-up after your visit, reach [place] at [phone] or MyChart messaging. [x]   Complex Concerns: If your concern is more complex, scheduling an appointment might be best. Discuss this with the staff to find the most suitable option. [x]   Lab & Imaging Results: We'll contact you directly if results are abnormal or you don't use MyChart. Most normal results will be on MyChart within 2-3 business days, with a review message from Dr. Jon Billings. Haven't heard back in 2 weeks? Need results sooner? Contact us at [phone]. [x]   Referrals: Our referral coordinator will manage specialist referrals. The specialist's office should contact you within 2 weeks to schedule an appointment. Call us if you haven't heard from them after 2 weeks.  Staying Connected [x]   MyChart: Activate your MyChart for the fastest way to access results and message [place]. See the last page of this paperwork for instructions on how to activate.  Bring to Your Next Appointment [x]   Medications: Please bring all your medication bottles to your next appointment to ensure we have an accurate record of your prescriptions. [x]   Health Diaries: If you're monitoring any health conditions at home, keeping a diary of your readings can be very helpful for discussions at your next appointment.  Billing [x]   X-ray & Lab Orders: These are billed by separate companies. Contact the  invoicing company directly for questions or concerns. [x]   Visit Charges: Discuss any billing inquiries with our administrative services team.  Your Satisfaction Matters [x]   Share Your Experience: We strive for your  satisfaction! If you have any complaints, or preferably compliments, please let Dr. Jon Billings know directly or contact our Practice Administrators, Edwena Felty or [employer], by asking at the front desk.   Reviewing Your Records [x]   Review this early draft of your clinical encounter notes below and the final encounter summary tomorrow on MyChart after its been completed.  All orders placed so far are visible here: Congestion of larynx Assessment & Plan: This is one of his main complaints is chronic and persistent and if you look at my analysis of the laryngeal pharyngeal reflux assessment and plan you will say most of my thoughts on it I did try get him to do gargles.  He is reporting that vitamin C homeopathically works which I encouraged to continue as well.  Orders: -     Ambulatory referral to ENT  Mixed hyperlipidemia Assessment & Plan: He does not take statin or aspirin despite very high 10-year cardiovascular risk we will recheck cholesterol and try to get him to start taking it on the follow-up   Orders: -     Lipid panel  Malignant neoplasm of prostate (HCC) -     PSA  Essential hypertension Assessment & Plan: Outstanding blood pressure for a while now I have no concerns on that continue with current management   Neck mass  Chronic diastolic heart failure (HCC) Assessment & Plan: This seems to have dramatically improved since the last visit and his legs have markedly healed  Orders: -     CP4508-PT/INR AND PTT  Other fatigue Assessment & Plan: Will do comprehensive blood work to examine for his chronic fatigue of uncertain probably multifactorial etiology  Orders: -     CP4508-PT/INR AND PTT -     TSH -     Comprehensive metabolic panel -     CBC with Differential/Platelet -     VITAMIN D 25 Hydroxy (Vit-D Deficiency, Fractures) -     B12 and Folate Panel  Serum sodium decreased Assessment & Plan: This is probably a major contributor to his chronic  fatigue and cognitive impairment and we need to work into it and do closer follow-up although it has been a chronic.  I encouraged him to eat more salt limit fluid and follow-up with me in a month and we will also get additional lab work to check the osmolality to determine the source but he does take an NSAID have a history of heart failure and there is alcohol use all of which are suspicious for contributors also there is thyroid disease with active Hashimoto's   Venous (peripheral) insufficiency  Rash and nonspecific skin eruption  Hypothyroidism, acquired -     TSH -     T4, free -     Osmolality  Leg pain, bilateral  Ataxia Assessment & Plan: This has been stable continue with using cane   Alcohol use Assessment & Plan: I encouraged patient to discontinue alcohol it may be related to his low sodium and also can contribute to dementia I am concerned about some cognitive slowing   Gait difficulty Assessment & Plan: This remains persistent and I added fall risk to his problem list as a result not certain why he needs the cane it suspect related to liver is causing the cognitive slowing probably  multifactorial I try to get chronic care management to support and physical therapy his wife cares for well though  Orders: -     CBC with Differential/Platelet -     VITAMIN D 25 Hydroxy (Vit-D Deficiency, Fractures) -     B12 and Folate Panel  Iron deficiency anemia due to chronic blood loss Assessment & Plan: Will resolve this problem as there has not been any issue for a while but with severe in 2018 unfortunately it is tied to darbepoetin orders that are still active for some reason and I cannot cancel it out  Orders: -     Iron, TIBC and Ferritin Panel  Laryngopharyngeal reflux (LPR) Assessment & Plan: This is associated with some congestion in his throat that constantly bothers him I encouraged him to see ear nose and throat and for this reason referred him to new ear nose  and throat physician to confirm that the sense of congestion in his throat is linked.  He also follow-up with GI at [facility] but did not want me to refer back to it this time as he said they do not have the right equipment to dilate his throat and esophagus we talked about maybe doing salt water gargles or Listerine gargles to try to reduce the unpleasant sensation of throat congestion related to this he does not really feel the reflux so it is a possible missed diagnosis but he has been told this by prior ear nose and throat I added the throat congestion problem as well separately since its uncertain   Other irritable bowel syndrome Assessment & Plan: Continue with Linzess this was not a focus of discussion today   History of prostate cancer -     PSA  Hyponatremia Assessment & Plan: This is probably a major contributor to his chronic fatigue and cognitive impairment and we need to work into it and do closer follow-up although it has been a chronic.  I encouraged him to eat more salt limit fluid and follow-up with me in a month and we will also get additional lab work to check the osmolality to determine the source but he does take an NSAID have a history of heart failure and there is alcohol use all of which are suspicious for contributors also there is thyroid disease with active Hashimoto's  Orders: -     Osmolality -     Chloride, urine, random -     Urine Sodium & Potassium -     ACTH -     Cortisol -     Osmolality, urine -     Sodium, urine, random -     Aldosterone + renin activity w/ ratio -     Cortisol  Memory impairment Assessment & Plan: Working this up primarily as a sodium and alcohol related problem but I do encourage vitamins and I was able to get him to agree to a neurology referral to evaluate the issue in more detail as it is complex and he is not currently feeling like this is even a real problem is that he is rather mild  Orders: -     Ambulatory referral to  Neurology  Hashimoto's thyroiditis  Polyp of colon, unspecified part of colon, unspecified type Assessment & Plan: Also follows with [name] but is not planning on returning at this time although I encouraged him to do so   Numbness Assessment & Plan: 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

## 2023-02-20 ENCOUNTER — Encounter: Payer: Self-pay | Admitting: Internal Medicine

## 2023-02-20 DIAGNOSIS — R7303 Prediabetes: Secondary | ICD-10-CM | POA: Insufficient documentation

## 2023-02-20 NOTE — Progress Notes (Signed)
Key findings from recent labs:  1. Persistent hyponatremia (Na 128 mmol/L) - likely contributing to fatigue and cognitive symptoms 2. Elevated lipids (Total cholesterol 201 mg/dL, LDL 664 mg/dL) - increased cardiovascular risk 3. Mildly elevated liver enzymes (AST 43 U/L, Total bilirubin 1.3 mg/dL) - possibly alcohol-related 4. Slightly elevated glucose (125 mg/dL)  Plan: - Advised increasing salt intake and limiting fluids to 1.5L/day - Recommended reducing alcohol consumption - Considering statin therapy for cholesterol management - Thyroid function normal, continue current levothyroxine dose - Follow-up in one month for repeat sodium and lipid panel  Patient instructions: - Emphasize importance of dietary changes and alcohol reduction - Remind to schedule follow-up appointment in one month - Encourage questions about new recommendations - Reassure that we're addressing fatigue and cognitive concerns  If patient expresses anxiety about results, reassure that we have a clear plan to address findings and improve symptoms.

## 2023-02-21 NOTE — Progress Notes (Signed)
Update: Recent labs show persistent moderate hyponatremia (Na 128 mmol/L on 02/17/2023). Given the patient's complex medical history including CHF, alcohol use, and possible SIADH, we need to adjust our approach. Updated Analysis:  Hypovolemic hyponatremia is now more likely, based on low urine sodium (12 mEq/L) and high urine osmolality (514 mOsm/kg). Multiple contributing factors still in play: CHF, alcohol use, and possible SIADH. Fatigue and cognitive symptoms likely related to persistent hyponatremia. Prediabetes (glucose 125 mg/dL) and hyperlipidemia need attention.  Updated Plan:  Schedule patient for an appointment within 2 weeks. Continue current recommendations: increase salt intake, limit fluids to 1.5L/day. Strongly emphasize the need to reduce or eliminate alcohol consumption. Prepare for possible IV saline treatment if no improvement at next visit. Consider endocrinology consult for further evaluation of possible SIADH. Review medication list for any drugs potentially contributing to hyponatremia. Educate patient on symptoms requiring immediate medical attention. At next visit, address prediabetes management and lipid control.  Please ensure the patient understands the importance of this follow-up and the potential seriousness of persistent hyponatremia. Flag this case for close monitoring.

## 2023-02-24 ENCOUNTER — Encounter: Payer: Self-pay | Admitting: Internal Medicine

## 2023-03-02 LAB — ALDOSTERONE + RENIN ACTIVITY W/ RATIO
Aldosterone: 14 ng/dL
Renin Activity: 1.6 ng/mL/h (ref 0.25–5.82)

## 2023-03-10 ENCOUNTER — Encounter: Payer: Self-pay | Admitting: Internal Medicine

## 2023-03-10 ENCOUNTER — Ambulatory Visit (INDEPENDENT_AMBULATORY_CARE_PROVIDER_SITE_OTHER): Payer: PPO | Admitting: Internal Medicine

## 2023-03-10 VITALS — BP 106/55 | HR 69 | Temp 99.6°F | Ht 67.0 in | Wt 191.2 lb

## 2023-03-10 DIAGNOSIS — R634 Abnormal weight loss: Secondary | ICD-10-CM

## 2023-03-10 DIAGNOSIS — R490 Dysphonia: Secondary | ICD-10-CM

## 2023-03-10 DIAGNOSIS — M79605 Pain in left leg: Secondary | ICD-10-CM | POA: Diagnosis not present

## 2023-03-10 DIAGNOSIS — D7589 Other specified diseases of blood and blood-forming organs: Secondary | ICD-10-CM

## 2023-03-10 DIAGNOSIS — M79604 Pain in right leg: Secondary | ICD-10-CM | POA: Diagnosis not present

## 2023-03-10 DIAGNOSIS — E871 Hypo-osmolality and hyponatremia: Secondary | ICD-10-CM | POA: Diagnosis not present

## 2023-03-10 DIAGNOSIS — I10 Essential (primary) hypertension: Secondary | ICD-10-CM

## 2023-03-10 DIAGNOSIS — R7989 Other specified abnormal findings of blood chemistry: Secondary | ICD-10-CM

## 2023-03-10 HISTORY — DX: Other specified abnormal findings of blood chemistry: R79.89

## 2023-03-10 LAB — CBC WITH DIFFERENTIAL/PLATELET
Absolute Monocytes: 676 cells/uL (ref 200–950)
Basophils Absolute: 69 cells/uL (ref 0–200)
Basophils Relative: 1 %
Eosinophils Absolute: 207 cells/uL (ref 15–500)
Eosinophils Relative: 3 %
HCT: 45.7 % (ref 38.5–50.0)
Hemoglobin: 16.2 g/dL (ref 13.2–17.1)
Lymphs Abs: 766 cells/uL — ABNORMAL LOW (ref 850–3900)
MCH: 36.6 pg — ABNORMAL HIGH (ref 27.0–33.0)
MCHC: 35.4 g/dL (ref 32.0–36.0)
MCV: 103.2 fL — ABNORMAL HIGH (ref 80.0–100.0)
MPV: 11.7 fL (ref 7.5–12.5)
Monocytes Relative: 9.8 %
Neutro Abs: 5182 cells/uL (ref 1500–7800)
Neutrophils Relative %: 75.1 %
Platelets: 208 10*3/uL (ref 140–400)
RBC: 4.43 10*6/uL (ref 4.20–5.80)
RDW: 11.8 % (ref 11.0–15.0)
Total Lymphocyte: 11.1 %
WBC: 6.9 10*3/uL (ref 3.8–10.8)

## 2023-03-10 MED ORDER — CELECOXIB 100 MG PO CAPS
100.0000 mg | ORAL_CAPSULE | Freq: Two times a day (BID) | ORAL | 2 refills | Status: DC
Start: 2023-03-10 — End: 2023-03-26

## 2023-03-10 NOTE — Patient Instructions (Signed)
VISIT SUMMARY:  During your visit, we discussed your ongoing concerns about fatigue and low salt levels, changes in bowel movements, unintentional weight loss, and pain management. We also reviewed your medication regimen and your recent dietary changes. We noted improvements in your depressive symptoms and cessation of coughing, choking, and gagging symptoms.  YOUR PLAN:  -HYPONATREMIA: Hyponatremia is a condition where the sodium level in your blood is lower than normal. We will continue to monitor your sodium levels and may consider referring you to an endocrinologist if they do not improve. We will also recheck your sodium levels with today's lab work.  -UNINTENTIONAL WEIGHT LOSS: You've lost 20 pounds without trying, which can sometimes indicate a health problem. We will keep a close eye on your weight and may refer you to a GI specialist if the weight loss continues.  -MACROCYTOSIS: Macrocytosis is a condition where your red blood cells are larger than normal. This can be caused by several factors, including alcohol use, vitamin deficiencies, and potential malignancies. We will order additional lab work to rule out serious conditions such as leukemia and multiple myeloma.  -HYPERTENSION: Hypertension, or high blood pressure, can lead to serious health problems if not managed properly. We will monitor your blood pressure at home and may adjust your Lisinopril dose based on these readings.  -PAIN MANAGEMENT: We will continue to use Celebrex as needed for severe pain. However, we discussed potential side effects, including sodium loss and bleeding.  INSTRUCTIONS:  Please continue to monitor your symptoms and report any changes. Follow-up in 3 months or sooner if weight loss continues or sodium levels do not improve. Remember to monitor your blood pressure at home and record the readings for our review.  It was a pleasure seeing you today! Your health and satisfaction are our top priorities.   Glenetta Hew, MD  Your Providers PCP: [name], MD,  [phone]) Referring Provider: [name], MD,  [phone]) Care Team Provider: Rollene Rotunda, MD,  [phone]) Care Team Provider: Vida Rigger, MD,  [phone])     NEXT STEPS: [x]  Early Intervention: Schedule sooner appointment, call our on-call services, or go to emergency room if there is any significant Increase in pain or discomfort New or worsening symptoms Sudden or severe changes in your health [x]  Flexible Follow-Up: We recommend a No follow-ups on file. for optimal routine care. This allows for progress monitoring and treatment adjustments. [x]  Preventive Care: Schedule your annual preventive care visit! It's typically covered by insurance and helps identify potential health issues early. [x]  Lab & X-ray Appointments: Incomplete tests scheduled today, or call to schedule. X-rays: [facility] (M-F, 8:30am-noon or 1pm-5pm). [x]  Medical Information Release: Sign a release form at front desk to obtain relevant medical information we don't have.  MAKING THE MOST OF OUR FOCUSED 20 MINUTE APPOINTMENTS: [x]   Clearly state your top concerns at the beginning of the visit to focus our discussion [x]   If you anticipate you will need more time, please inform the front desk during scheduling - we can book multiple appointments in the same week. [x]   If you have transportation problems- use our convenient video appointments or ask about transportation support. [x]   We can get down to business faster if you use MyChart to update information before the visit and submit non-urgent questions before your visit. Thank you for taking the time to provide details through MyChart.  Let our nurse know and she can import this information into your encounter documents.  Arrival and Wait Times: [x]   Arriving on time ensures that everyone receives prompt attention. [x]   Early morning (8a) and afternoon (1p)  appointments tend to have shortest wait times. [x]   Unfortunately, we cannot delay appointments for late arrivals or hold slots during phone calls.  Getting Answers and Following Up [x]   Simple Questions & Concerns: For quick questions or basic follow-up after your visit, reach [place] at [phone] or MyChart messaging. [x]   Complex Concerns: If your concern is more complex, scheduling an appointment might be best. Discuss this with the staff to find the most suitable option. [x]   Lab & Imaging Results: We'll contact you directly if results are abnormal or you don't use MyChart. Most normal results will be on MyChart within 2-3 business days, with a review message from Dr. Jon Billings. Haven't heard back in 2 weeks? Need results sooner? Contact us at [phone]. [x]   Referrals: Our referral coordinator will manage specialist referrals. The specialist's office should contact you within 2 weeks to schedule an appointment. Call us if you haven't heard from them after 2 weeks.  Staying Connected [x]   MyChart: Activate your MyChart for the fastest way to access results and message [place]. See the last page of this paperwork for instructions on how to activate.  Bring to Your Next Appointment [x]   Medications: Please bring all your medication bottles to your next appointment to ensure we have an accurate record of your prescriptions. [x]   Health Diaries: If you're monitoring any health conditions at home, keeping a diary of your readings can be very helpful for discussions at your next appointment.  Billing [x]   X-ray & Lab Orders: These are billed by separate companies. Contact the invoicing company directly for questions or concerns. [x]   Visit Charges: Discuss any billing inquiries with our administrative services team.  Your Satisfaction Matters [x]   Share Your Experience: We strive for your satisfaction! If you have any complaints, or preferably compliments, please let Dr. Jon Billings know directly or  contact our Practice Administrators, Edwena Felty or [employer], by asking at the front desk.   Reviewing Your Records [x]   Review this early draft of your clinical encounter notes below and the final encounter summary tomorrow on MyChart after its been completed.  All orders placed so far are visible here: Macrocytosis -     Pathologist smear review -     Protein electrophoresis, serum  Essential hypertension  Leg pain, bilateral -     Celecoxib; Take 1 capsule (100 mg total) by mouth 2 (two) times daily. Just for as needed severe pain  Dispense: 60 capsule; Refill: 2  Hyponatremia -     Basic metabolic panel  Hoarse voice quality  Unintentional weight loss

## 2023-03-10 NOTE — Assessment & Plan Note (Signed)
Noted today , although previously noted it without documenting on it, uncertain duration.

## 2023-03-10 NOTE — Assessment & Plan Note (Signed)
He has shown improvement in symptoms and sodium levels with increased dietary salt intake. We discussed the potential need for an endocrinology referral if sodium levels do not improve and the possibility of hospitalization if levels worsen. We will continue the increased dietary salt intake and recheck sodium levels with today's lab work.

## 2023-03-10 NOTE — Progress Notes (Signed)
[facility]: [phone]   -- Medical Office Visit --  Patient:  Scott Newman      Age: 77 y.o.       Sex:  male  Date:   03/10/2023 Patient Care Team: [name], MD as PCP - General (Internal Medicine) Rollene Rotunda, MD as PCP - Cardiology (Cardiology) Vida Rigger, MD as Consulting Physician (Gastroenterology) Today's Healthcare Provider: [name], MD      Assessment & Plan Hyponatremia He has shown improvement in symptoms and sodium levels with increased dietary salt intake. We discussed the potential need for an endocrinology referral if sodium levels do not improve and the possibility of hospitalization if levels worsen. We will continue the increased dietary salt intake and recheck sodium levels with today's lab work. Macrocytosis Large red blood cells were noted on previous lab work. We discussed potential causes, including alcohol use, vitamin deficiencies, and potential malignancies. We will order additional lab work to rule out malignancies such as leukemia and multiple myeloma. Unintentional weight loss He reports unintentional weight loss of 20 pounds without a clear cause identified. We will monitor his weight closely and consider further workup or referral to a GI specialist if the weight loss continues. Advised patient to attempt to stop losing weight for now to see if he is capable of preventing it.  Essential hypertension His blood pressure readings have been inconsistent while on Lisinopril 10mg  daily. We will monitor blood pressure at home and consider adjusting the Lisinopril dose based on home readings. Leg pain, bilateral He has used Celebrex for severe pain with good effect. We discussed potential side effects, including sodium loss and bleeding. We will continue Celebrex as needed for severe pain.noted the change in medication(s) list and discussed with patient.  Hoarse voice quality Noted today , although previously noted  it without documenting on it, uncertain duration.  Follow-up in 3 months or sooner if weight loss continues or sodium levels do not improve.      Diagnoses and all orders for this visit: Hyponatremia -     Basic Metabolic Panel (BMET) Macrocytosis -     Pathologist smear review -     Protein Electrophoresis, (serum) -     CBC with Differential/Platelet Unintentional weight loss Essential hypertension Leg pain, bilateral -     celecoxib (CELEBREX) 100 MG capsule; Take 1 capsule (100 mg total) by mouth 2 (two) times daily. Just for as needed severe pain Hoarse voice quality Elevated LFTs  Future Appointments  Date Time Provider Department Center  05/17/2023  4:20 PM Rollene Rotunda, MD [facility] None  10/06/2023  3:30 PM [facility] ANNUAL WELLNESS VISIT 1 [facility]         Subjective   77 y.o. male who has Hyperlipidemia; Alcohol use; Essential hypertension; Chronic diastolic heart failure (HCC); Venous (peripheral) insufficiency; Colon polyps; Gait difficulty; Numbness; Malignant neoplasm of prostate (HCC); hypothyroidism due to Hashimoto's thyroiditis; Bilateral carotid artery disease (HCC); History of prostate cancer; Laryngopharyngeal reflux (LPR); Branchial cleft cyst; Pharyngoesophageal dysphagia; Penicillin allergy; IBS (irritable bowel syndrome); Other fatigue; Hyponatremia; Ataxia; Memory impairment; Congestion of larynx; Macrocytosis; Leg pain, bilateral; and Hoarse voice quality on their problem list. His reasons/main concerns/chief complaints for today's office visit are 3 week follow-up   ------------------------------------------------------------------------------------------------------------------------ AI-Extracted: Discussed the use of AI scribe software for clinical note transcription with the patient, who gave verbal consent to proceed.  History of Present Illness   The patient, with a history of hyponatremia, presents with concerns  about persistent fatigue and  low salt levels. He denies taking any diuretics. He has an upcoming appointment for an esophageal issue and has reduced his alcohol intake. He reports a slight improvement in his depressive symptoms since his salt levels have increased. He also reports ongoing issues with bowel movements since his colonoscopy, including changes in stool consistency. He denies any abdominal pain or diarrhea.  The patient also reports a decrease in appetite and unintentional weight loss of approximately 20 pounds. He has been consuming more processed foods and snacks high in salt in an attempt to increase his sodium levels. He denies any changes in his medication regimen, which includes Lisinopril and thyroid medication. He has also been taking Celebrex as needed for severe pain, which he reports has been effective.  In the past, the patient experienced an intestinal bleed, which he believes may have been related to radiation treatment. He reports a history of severe leg pain, which has since resolved. He also reports a history of throat issues, including a sensation of a "pocket" in his throat that would catch things and a narrow esophagus, which was stretched. He has been using a vitamin C drink, which he believes has helped control these throat issues.  The patient's partner reports that the patient's coughing, choking, and gagging symptoms have ceased since starting the vitamin C drink. He also reports a history of swelling in his legs, which he attributes to a high-salt diet. He has since reduced his intake of high-salt foods like pizza and fast food hamburgers.      He has a past medical history of Anemia, iron deficiency (03/12/2017), Aortic stenosis, Arthritis, Cataract, Cellulitis (07/14/2022), Chronic diastolic heart failure (HCC), Colon polyp, Depression, Difficulty swallowing, Elevated LFTs (03/10/2023), Gait abnormality, GERD (gastroesophageal reflux disease), GI bleeding (03/10/2017), Hernia, History of alcohol  abuse, History of cellulitis and abscess, HTN (hypertension), Hyperlipidemia, Hypothyroidism, Neck mass (06/15/2016), Prostate cancer (HCC) (nov 2016 jan 2017), Rash and nonspecific skin eruption (07/14/2022), Rash and nonspecific skin eruption (07/14/2022), Salmonella (age 73), Screening for AAA (abdominal aortic aneurysm) (06/07/2017), and Thyroid disease.  Problem list overviews that were updated at today's visit: Problem  Macrocytosis   Lab Results  Component Value Date   MCV 103.7 (H) 02/17/2023   MCV 105.7 (H) 11/15/2022   MCV 100.0 06/26/2018   MCV 124.8 (H) 11/01/2017   MCV 92.6 04/28/2017   Per most recent structured social history update, " reports current alcohol use." Lab Results  Component Value Date   HGB 16.0 02/17/2023   Lab Results  Component Value Date   VITAMINB12 538 02/17/2023   VITAMINB12 584 11/30/2022   VITAMINB12 336 11/01/2017   No components found for: "MMA" Lab Results  Component Value Date   FOLATE 6.0 02/17/2023   Lab Results  Component Value Date   TSH 1.22 02/17/2023   TSH 1.300 11/15/2022   TSH 26.320 (H) 07/29/2014   Lab Results  Component Value Date   FREET4 1.2 02/17/2023   No results found for: "SPEP" No results found for: "UPEP"    Latest Ref Rng & Units 02/17/2023    4:32 PM  Serum Protein Electrophoresis  Total Protein 6.1 - 8.1 g/dL 7.4    No results found for: "PATHREVIEW"      Leg Pain, Bilateral  Hoarse Voice Quality  Hyponatremia   Hyponatremia (E87.1) Persistent moderate hyponatremia, likely multifactorial (CHF, alcohol use, possible SIADH). Current Na 128 mmol/L (02/17/2023). Associated with fatigue and cognitive symptoms. Plan: Increase salt intake, limit  fluids to 1.5L/day, reduce alcohol consumption. Follow-up in one month.  Plasma sodium: 128 mEq/L (low) Plasma osmolality: 279 mOsm/kg (low normal) Urine sodium: 12 mEq/L (low) Urine osmolality: 514 mOsm/kg (concentrated)  Based on these values, we can make  the following analysis:  The patient has hypotonic hyponatremia (low plasma sodium with low-normal plasma osmolality). The low urine sodium (12 mEq/L) suggests that the body is trying to retain sodium, which is typical in volume depletion or effective circulating volume depletion. The high urine osmolality (514 mOsm/kg) indicates that the kidneys are concentrating urine appropriately in response to the hyponatremia.  Given these findings, the most likely cause of hyponatremia in this case is: Hypovolemic hyponatremia (or volume depletion) Reasoning:  Low plasma sodium and low-normal plasma osmolality indicate hypotonic hyponatremia. Low urine sodium suggests appropriate renal sodium conservation in response to volume depletion. High urine osmolality shows that the kidneys are responding appropriately to the hyponatremia by concentrating urine.  Possible underlying causes could include:  Gastrointestinal losses (e.g., vomiting, diarrhea) Excessive sweating Third-spacing of fluids (e.g., pancreatitis, burns) Diuretic use (though this is less likely given the low urine sodium) Hypovolemic hyponatremia is now more likely, based on low urine sodium (12 mEq/L) and high urine osmolality (514 mOsm/kg). Multiple contributing factors still in play: CHF, alcohol use, and possible SIADH. Fatigue and cognitive symptoms likely related to persistent hyponatremia. Prediabetes (glucose 125 mg/dL) and hyperlipidemia need attention.  Updated Plan 02/21/23 after lab results:  Schedule patient for an appointment within 2 weeks. Continue current recommendations: increase salt intake, limit fluids to 1.5L/day. Strongly emphasize the need to reduce or eliminate alcohol consumption. Prepare for possible IV saline treatment if no improvement at next visit. Consider endocrinology consult for further evaluation of possible SIADH. Review medication list for any drugs potentially contributing to  hyponatremia. Educate patient on symptoms requiring immediate medical attention. At next visit, address prediabetes management and lipid control.  Lab Results  Component Value Date   NA 128 (L) 02/17/2023   NA 128 (L) 11/30/2022   NA 127 (L) 11/15/2022   NA 133 (L) 06/26/2018   NA 132 (L) 03/17/2017   NA 134 (L) 03/13/2017   NA 134 (L) 03/12/2017    Need osmolality evaluate.  Lab Results  Component Value Date   NA 128 (L) 02/17/2023   CREATININE 0.72 02/17/2023   GLUCOSE 125 (H) 02/17/2023   TSH 1.22 02/17/2023   Lab Results  Component Value Date   NA 128 (L) 02/17/2023   K 4.0 02/17/2023   CREATININE 0.72 02/17/2023   GFRNONAA >60 06/26/2018   GLUCOSE 125 (H) 02/17/2023     Is the patient euvolemic, hypovolemic, or hypervolemic on exam? euvolemic to hypervolemic   Any history of CHF?yes Any history of liver failure?  no Any history of thyroid disease?  yes Is the patient prescribed thiazide? no Does the patient use NSAID's? yes celebrex Does the patient use SSRI's?  no Any recent medication changes? no .hyponat  urine lytes and urine creatinine, serum osmolality, urine osmolality, or TSH, T4 moderate (125 - 135 meq/dl), due to congestive heart failure, and etiology unknown   fluid restriction of 1.5 L and encouraging sodium intake CHF and polydipsia   Treatment Options: fluid restriction increase fluid intake IV fluids liberalized sodium intake loop diuretic medication changes sodium supplementation with sodium chloride tablets urea supplementation vasopressin antagonists    Essential Hypertension   BP Readings from Last 5 Encounters:  02/17/23 118/63  12/02/22 136/60  11/15/22 114/68  10/04/22  128/70  08/16/22 134/61   Current hypertension medications:       Sig   lisinopril (ZESTRIL) 10 MG tablet (Taking) Take 10 mg by mouth daily.         Elevated Lfts (Resolved)   Lab Results  Component Value Date/Time   ALT 41 02/17/2023 04:32  PM   ALT 86 (H) 11/30/2022 02:54 PM   ALT 120 (H) 11/15/2022 04:30 PM   ALT 45 (H) 06/26/2018 10:42 AM   ALT 18 03/10/2017 06:46 PM   Lab Results  Component Value Date/Time   AST 43 (H) 02/17/2023 04:32 PM   AST 89 (H) 11/30/2022 02:54 PM   AST 118 (H) 11/15/2022 04:30 PM   AST 57 (H) 06/26/2018 10:42 AM   AST 23 03/10/2017 06:46 PM   Lab Results  Component Value Date/Time   ALKPHOS 85 11/30/2022 02:54 PM   ALKPHOS 86 11/15/2022 04:30 PM   ALKPHOS 53 06/26/2018 10:42 AM   ALKPHOS 55 03/10/2017 06:46 PM   ALKPHOS 52 07/29/2014 01:07 PM   No components found for: "BILIT" No results found for: "LABGGT"     Prediabetes (Resolved)    Prediabetes (R73.03) [New diagnosis] Fasting glucose 125 mg/dL (14/78/2956). Associated with increased cardiovascular risk. Plan: Lifestyle modifications, monitor with HbA1c at next visit.  Lab Results  Component Value Date   HGBA1C 5.8 11/30/2022       Current Outpatient Medications on File Prior to Visit  Medication Sig   B Complex Vitamins (VITAMIN B COMPLEX PO) Place under the tongue.   Cholecalciferol (VITAMIN D) 2000 units CAPS Take 2,000-4,000 Units by mouth daily.    Chromium Picolinate (CHROMIUM PICOLATE PO) Take 1,000 mcg by mouth daily.   Coenzyme Q10 (COQ10) 100 MG CAPS Take 100-200 mg by mouth daily as needed (chest weakness).   levothyroxine (SYNTHROID) 50 MCG tablet TAKE 1 TABLET BY MOUTH EVERY DAY   linaclotide (LINZESS) 145 MCG CAPS capsule Take 1 capsule (145 mcg total) by mouth daily before breakfast.   lisinopril (ZESTRIL) 10 MG tablet Take 10 mg by mouth daily.   LORazepam (ATIVAN) 0.5 MG tablet Take 0.0625-0.125 mg by mouth daily as needed for anxiety or sleep.    Lutein 20 MG CAPS Take 20 mg by mouth daily.   MAGNESIUM-POTASSIUM PO Take 1 tablet by mouth daily.   Tetrahydrozoline HCl (VISINE OP) Apply 1 drop to eye daily as needed (dry eyes).   No current facility-administered medications on file prior to visit.    Medications Discontinued During This Encounter  Medication Reason   b complex vitamins tablet    celecoxib (CELEBREX) 100 MG capsule      Objective   Physical Exam  BP (!) 106/55 (BP Location: Right Arm, Patient Position: Sitting)   Pulse 69   Temp 99.6 F (37.6 C) (Temporal)   Ht 5\' 7"  (1.702 m)   Wt 191 lb 3.2 oz (86.7 kg)   SpO2 96%   BMI 29.95 kg/m  Wt Readings from Last 10 Encounters:  03/10/23 191 lb 3.2 oz (86.7 kg)  02/17/23 190 lb 9.6 oz (86.5 kg)  12/02/22 198 lb (89.8 kg)  11/15/22 197 lb 9.6 oz (89.6 kg)  10/04/22 201 lb 12.8 oz (91.5 kg)  08/16/22 200 lb 9.6 oz (91 kg)  07/14/22 207 lb 12.8 oz (94.3 kg)  06/26/18 207 lb (93.9 kg)  11/01/17 203 lb 6.4 oz (92.3 kg)  06/07/17 180 lb 6.4 oz (81.8 kg)   Vital signs reviewed.  Nursing notes reviewed.  Weight trend reviewed. Abnormalities and Problem-Specific physical exam findings:  weight loss noted. Hoarse voice noted.   General Appearance:  No acute distress appreciable.   Well-groomed, healthy-appearing male.  Well proportioned with no abnormal fat distribution.  Good muscle tone. Pulmonary:  Normal work of breathing at rest, no respiratory distress apparent. SpO2: 96 %  Musculoskeletal: All extremities are intact.  Neurological:  Awake, alert, oriented, and engaged.  No obvious focal neurological deficits or cognitive impairments.  Sensorium seems unclouded.   Speech is clear and coherent with logical content. Walks with cane Psychiatric:  Appropriate mood, pleasant and cooperative demeanor, thoughtful and engaged during the exam  Results   LABS Hb: 5 (Spring 2018)  DIAGNOSTIC Colonoscopy: Identified source of intestinal bleed (Spring 2018) Endoscopy: Identified source of intestinal bleed (Spring 2018)        No results found for any visits on 03/10/23.  Office Visit on 02/17/2023  Component Date Value   aPTT 02/17/2023 30    INR 02/17/2023 1.1    Prothrombin Time 02/17/2023 11.4    Cholesterol  02/17/2023 201 (H)    HDL 02/17/2023 39 (L)    Triglycerides 02/17/2023 174 (H)    LDL Cholesterol (Calc) 02/17/2023 131 (H)    Total CHOL/HDL Ratio 02/17/2023 5.2 (H)    Non-HDL Cholesterol (Cal* 02/17/2023 162 (H)    TSH 02/17/2023 1.22    Glucose, Bld 02/17/2023 125 (H)    BUN 02/17/2023 9    Creat 02/17/2023 0.72    BUN/Creatinine Ratio 02/17/2023 SEE NOTE:    Sodium 02/17/2023 128 (L)    Potassium 02/17/2023 4.0    Chloride 02/17/2023 95 (L)    CO2 02/17/2023 26    Calcium 02/17/2023 9.4    Total Protein 02/17/2023 7.4    Albumin 02/17/2023 4.3    Globulin 02/17/2023 3.1    AG Ratio 02/17/2023 1.4    Total Bilirubin 02/17/2023 1.3 (H)    Alkaline phosphatase (AP* 02/17/2023 72    AST 02/17/2023 43 (H)    ALT 02/17/2023 41    WBC 02/17/2023 7.4    RBC 02/17/2023 4.38    Hemoglobin 02/17/2023 16.0    HCT 02/17/2023 45.4    MCV 02/17/2023 103.7 (H)    MCH 02/17/2023 36.5 (H)    MCHC 02/17/2023 35.2    RDW 02/17/2023 11.9    Platelets 02/17/2023 158    MPV 02/17/2023 10.6    Neutro Abs 02/17/2023 5,535    Lymphs Abs 02/17/2023 851    Absolute Monocytes 02/17/2023 747    Eosinophils Absolute 02/17/2023 200    Basophils Absolute 02/17/2023 67    Neutrophils Relative % 02/17/2023 74.8    Total Lymphocyte 02/17/2023 11.5    Monocytes Relative 02/17/2023 10.1    Eosinophils Relative 02/17/2023 2.7    Basophils Relative 02/17/2023 0.9    Vit D, 25-Hydroxy 02/17/2023 44    Vitamin B-12 02/17/2023 538    Folate 02/17/2023 6.0    PSA 02/17/2023 <0.04    Free T4 02/17/2023 1.2    Osmolality 02/17/2023 279    Iron 02/17/2023 177    TIBC 02/17/2023 328    %SAT 02/17/2023 54 (H)    Ferritin 02/17/2023 154    Chloride Urine 02/17/2023 <20 (L)    C206 ACTH 02/17/2023 14    Osmolality, Ur 02/17/2023 514    Sodium, Ur 02/17/2023 12 (L)    Cortisol, Plasma 02/17/2023 17.4    Aldosterone 02/17/2023 14    Renin Activity 02/17/2023 1.60  ALDO / PRA Ratio 02/17/2023 8.8    Appointment on 01/06/2023  Component Date Value   Area-P 1/2 01/06/2023 2.54    S' Lateral 01/06/2023 3.60    AV Area mean vel 01/06/2023 0.62    AR max vel 01/06/2023 0.60    AV Area VTI 01/06/2023 0.59    P 1/2 time 01/06/2023 318    Ao pk vel 01/06/2023 4.61    AV Mean grad 01/06/2023 51.7    AV Peak grad 01/06/2023 85.0    Est EF 01/06/2023 55 - 60%   Lab on 11/30/2022  Component Date Value   Sodium 11/30/2022 128 (L)    Potassium 11/30/2022 4.1    Chloride 11/30/2022 92 (L)    CO2 11/30/2022 28    Glucose, Bld 11/30/2022 156 (H)    BUN 11/30/2022 6    Creatinine, Ser 11/30/2022 0.85    Total Bilirubin 11/30/2022 1.7 (H)    Alkaline Phosphatase 11/30/2022 85    AST 11/30/2022 89 (H)    ALT 11/30/2022 86 (H)    Total Protein 11/30/2022 7.3    Albumin 11/30/2022 4.2    GFR 11/30/2022 83.29    Calcium 11/30/2022 9.5    Vitamin B-12 11/30/2022 584    Folate 11/30/2022 6.9    Hgb A1c MFr Bld 11/30/2022 5.8   Office Visit on 11/15/2022  Component Date Value   Cholesterol 11/15/2022 215 (H)    Triglycerides 11/15/2022 174.0 (H)    HDL 11/15/2022 38.40 (L)    VLDL 11/15/2022 34.8    LDL Cholesterol 11/15/2022 142 (H)    Total CHOL/HDL Ratio 11/15/2022 6    NonHDL 11/15/2022 176.88    Sodium 11/15/2022 127 (L)    Potassium 11/15/2022 4.5    Chloride 11/15/2022 91 (L)    CO2 11/15/2022 28    Glucose, Bld 11/15/2022 140 (H)    BUN 11/15/2022 11    Creatinine, Ser 11/15/2022 0.90    Total Bilirubin 11/15/2022 1.7 (H)    Alkaline Phosphatase 11/15/2022 86    AST 11/15/2022 118 (H)    ALT 11/15/2022 120 (H)    Total Protein 11/15/2022 7.6    Albumin 11/15/2022 4.4    GFR 11/15/2022 81.89    Calcium 11/15/2022 9.6    WBC 11/15/2022 7.9    RBC 11/15/2022 4.39    Hemoglobin 11/15/2022 16.4    HCT 11/15/2022 46.4    MCV 11/15/2022 105.7 (H)    MCHC 11/15/2022 35.4    RDW 11/15/2022 12.6    Platelets 11/15/2022 177.0    Neutrophils Relative % 11/15/2022 76.0     Lymphocytes Relative 11/15/2022 9.7 (L)    Monocytes Relative 11/15/2022 10.7    Eosinophils Relative 11/15/2022 2.3    Basophils Relative 11/15/2022 1.3    Neutro Abs 11/15/2022 6.0    Lymphs Abs 11/15/2022 0.8    Monocytes Absolute 11/15/2022 0.8    Eosinophils Absolute 11/15/2022 0.2    Basophils Absolute 11/15/2022 0.1    Vit D, 25-Hydroxy 11/15/2022 41.7    TSH 11/15/2022 1.300    T3, Free 11/15/2022 2.3    Free T4 11/15/2022 1.31    Thyroperoxidase Ab SerPl* 11/15/2022 43 (H)    Thyroglobulin Antibody 11/15/2022 4.7 (H)    No image results found.   ECHOCARDIOGRAM COMPLETE  Result Date: 01/06/2023    ECHOCARDIOGRAM REPORT   Patient Name:   KETRICK PRZYWARA Date of Exam: 01/06/2023 Medical Rec #:  [id number]        Height:  67.0 in Accession #:    [id number]       Weight:       198.0 lb Date of Birth:  [birth year]       BSA:          2.014 m Patient Age:    76 years         BP:           136/60 mmHg Patient Gender: M                HR:           74 bpm. Exam Location:  [address] Procedure: 2D Echo, Cardiac Doppler and Color Doppler Indications:    I35.0 AS  History:        Patient has prior history of Echocardiogram examinations, most                 recent 03/11/2017. CHF, H/o alcohol abuse, AS/AI; Risk                 Factors:Hypertension and Dyslipidemia.  Sonographer:    Samule Ohm RDCS Referring Phys: [id number] JAMES HOCHREIN IMPRESSIONS  1. Left ventricular ejection fraction, by estimation, is 55 to 60%. The left ventricle has normal function. The left ventricle has no regional wall motion abnormalities. There is mild left ventricular hypertrophy. Left ventricular diastolic parameters are indeterminate.  2. Right ventricular systolic function is normal. The right ventricular size is normal. Tricuspid regurgitation signal is inadequate for assessing PA pressure.  3. Left atrial size was moderately dilated.  4. The mitral valve is degenerative. Mild mitral valve regurgitation.  Severe mitral annular calcification.  5. Mean gradient 51 mmHg, AVA 0.59 cm2, DI 0.19. The aortic valve is calcified. Aortic valve regurgitation is moderate to severe. Severe aortic valve stenosis.  6. The inferior vena cava is normal in size with greater than 50% respiratory variability, suggesting right atrial pressure of 3 mmHg. Conclusion(s)/Recommendation(s): AS is severe/ mod-severe AI, consider CT / structural consult. FINDINGS  Left Ventricle: Left ventricular ejection fraction, by estimation, is 55 to 60%. The left ventricle has normal function. The left ventricle has no regional wall motion abnormalities. The left ventricular internal cavity size was normal in size. There is  mild left ventricular hypertrophy. Left ventricular diastolic parameters are indeterminate. Right Ventricle: The right ventricular size is normal. Right ventricular systolic function is normal. Tricuspid regurgitation signal is inadequate for assessing PA pressure. Left Atrium: Left atrial size was moderately dilated. Right Atrium: Right atrial size was normal in size. Pericardium: There is no evidence of pericardial effusion. Mitral Valve: The mitral valve is degenerative in appearance. Severe mitral annular calcification. Mild mitral valve regurgitation. Tricuspid Valve: Tricuspid valve regurgitation is not demonstrated. Aortic Valve: Mean gradient 51 mmHg, AVA 0.59 cm2, DI 0.19. The aortic valve is calcified. Aortic valve regurgitation is moderate to severe. Aortic regurgitation PHT measures 318 msec. Severe aortic stenosis is present. Aortic valve mean gradient measures 51.7 mmHg. Aortic valve peak gradient measures 85.0 mmHg. Aortic valve area, by VTI measures 0.59 cm. Pulmonic Valve: Pulmonic valve regurgitation is not visualized. Aorta: The aortic root and ascending aorta are structurally normal, with no evidence of dilitation. Venous: The inferior vena cava is normal in size with greater than 50% respiratory variability,  suggesting right atrial pressure of 3 mmHg. IAS/Shunts: No atrial level shunt detected by color flow Doppler.  LEFT VENTRICLE PLAX 2D LVIDd:         4.95 cm  Diastology LVIDs:         3.60 cm   LV e' medial:    5.00 cm/s LV PW:         1.20 cm   LV E/e' medial:  35.0 LV IVS:        1.30 cm   LV e' lateral:   9.90 cm/s LVOT diam:     2.00 cm   LV E/e' lateral: 17.7 LV SV:         66 LV SV Index:   33 LVOT Area:     3.14 cm  RIGHT VENTRICLE             IVC RV S prime:     10.40 cm/s  IVC diam: 1.30 cm TAPSE (M-mode): 1.8 cm LEFT ATRIUM           Index        RIGHT ATRIUM           Index LA diam:      4.80 cm 2.38 cm/m   RA Pressure: 3.00 mmHg LA Vol (A2C): 55.6 ml 27.61 ml/m  RA Area:     12.80 cm LA Vol (A4C): 78.9 ml 39.18 ml/m  RA Volume:   29.30 ml  14.55 ml/m  AORTIC VALVE AV Area (Vmax):    0.60 cm AV Area (Vmean):   0.62 cm AV Area (VTI):     0.59 cm AV Vmax:           461.00 cm/s AV Vmean:          334.667 cm/s AV VTI:            1.110 m AV Peak Grad:      85.0 mmHg AV Mean Grad:      51.7 mmHg LVOT Vmax:         88.60 cm/s LVOT Vmean:        66.500 cm/s LVOT VTI:          0.210 m LVOT/AV VTI ratio: 0.19 AI PHT:            318 msec  AORTA Ao Root diam: 3.20 cm Ao Asc diam:  3.30 cm MITRAL VALVE                TRICUSPID VALVE MV Area (PHT): 2.54 cm     Estimated RAP:  3.00 mmHg MV Decel Time: 299 msec MV E velocity: 175.00 cm/s  SHUNTS MV A velocity: 57.20 cm/s   Systemic VTI:  0.21 m MV E/A ratio:  3.06         Systemic Diam: 2.00 cm Carolan Clines Electronically signed by Carolan Clines Signature Date/Time: 01/06/2023/3:58:00 PM    Final     US Venous Img Lower Unilateral Left (DVT)  Result Date: 11/10/2021 CLINICAL DATA:  Left lower extremity pain and edema for 1 week EXAM: LEFT LOWER EXTREMITY VENOUS DOPPLER ULTRASOUND TECHNIQUE: Gray-scale sonography with compression, as well as color and duplex ultrasound, were performed to evaluate the deep venous system(s) from the level of the common femoral vein  through the popliteal and proximal calf veins. COMPARISON:  None Available. FINDINGS: VENOUS Normal compressibility of the common femoral, superficial femoral, and popliteal veins, as well as the visualized calf veins. Visualized portions of profunda femoral vein and great saphenous vein unremarkable. No filling defects to suggest DVT on grayscale or color Doppler imaging. Doppler waveforms show normal direction of venous flow, normal respiratory plasticity and response to augmentation. Limited views of the contralateral  common femoral vein are unremarkable. OTHER None. Limitations: none IMPRESSION: No left lower extremity DVT. Electronically Signed   By: Acquanetta Belling M.D.   On: 11/10/2021 14:46      Attestation:  I have personally spent  42 minutes involved in face-to-face and non-face-to-face activities for this patient on the day of the visit. Professional time spent includes the following activities:  Preparing to see the patient by reviewing medical records prior to and during the encounter; Obtaining, documenting, and reviewing an updated medical history; Performing a medically appropriate examination;  Evaluating, synthesizing, and documenting the available clinical information in the EMR;  Collaboratively developing and communicating an individualized treatment plan with the patient; Placing medically necessary orders (for medications/tests/procedures/referrals);   This time was independent of any separately billable procedure(s).  The extended duration of this patient visit was medically necessary due to several factors:  The patient's health condition is multifaceted, requiring a comprehensive evaluation of patient and their past records to ensure accurate diagnosis and treatment planning; Effective patient education and communication, particularly for patients with complex care needs, often require additional time to ensure the patient (or caregivers) fully understand the care plan;  Coordination of  care with other healthcare professionals and services depends on thorough documentation, extending both documentation time and visit durations.  All these factors are integral to providing high-quality patient care and ensuring optimal health outcomes.   Additional Info: This encounter employed real-time, collaborative documentation. The patient actively reviewed and updated their medical record on a shared screen, ensuring transparency and facilitating joint problem-solving for the problem list, overview, and plan. This approach promotes accurate, informed care. The treatment plan was discussed and reviewed in detail, including medication safety, potential side effects, and all patient questions. We confirmed understanding and comfort with the plan. Follow-up instructions were established, including contacting the office for any concerns, returning if symptoms worsen, persist, or new symptoms develop, and precautions for potential emergency department visits.

## 2023-03-10 NOTE — Assessment & Plan Note (Signed)
He has used Celebrex for severe pain with good effect. We discussed potential side effects, including sodium loss and bleeding. We will continue Celebrex as needed for severe pain.noted the change in medication(s) list and discussed with patient.

## 2023-03-10 NOTE — Assessment & Plan Note (Signed)
His blood pressure readings have been inconsistent while on Lisinopril 10mg  daily. We will monitor blood pressure at home and consider adjusting the Lisinopril dose based on home readings.

## 2023-03-10 NOTE — Assessment & Plan Note (Signed)
Not on fluid pills no sodium in urine Euvolemic doubt CHF Has suspected beer potomania but I think its all just severe salt restriction Urine sodium 12 Doubt Celebrex causing but discussed possible role.

## 2023-03-10 NOTE — Assessment & Plan Note (Signed)
Large red blood cells were noted on previous lab work. We discussed potential causes, including alcohol use, vitamin deficiencies, and potential malignancies. We will order additional lab work to rule out malignancies such as leukemia and multiple myeloma.

## 2023-03-11 LAB — BASIC METABOLIC PANEL
BUN: 8 mg/dL (ref 6–23)
CO2: 24 meq/L (ref 19–32)
Calcium: 9.7 mg/dL (ref 8.4–10.5)
Chloride: 95 meq/L — ABNORMAL LOW (ref 96–112)
Creatinine, Ser: 0.77 mg/dL (ref 0.40–1.50)
GFR: 85.65 mL/min (ref >60.00)
Glucose, Bld: 119 mg/dL — ABNORMAL HIGH (ref 70–99)
Potassium: 3.8 meq/L (ref 3.5–5.1)
Sodium: 129 mEq/L — ABNORMAL LOW (ref 135–145)

## 2023-03-12 ENCOUNTER — Other Ambulatory Visit: Payer: Self-pay

## 2023-03-12 ENCOUNTER — Emergency Department (HOSPITAL_COMMUNITY): Payer: PPO

## 2023-03-12 ENCOUNTER — Inpatient Hospital Stay (HOSPITAL_COMMUNITY)
Admission: EM | Admit: 2023-03-12 | Discharge: 2023-03-20 | DRG: 871 | Disposition: A | Discharge status: Hospice | Payer: PPO | Attending: Internal Medicine | Admitting: Internal Medicine

## 2023-03-12 DIAGNOSIS — Z1152 Encounter for screening for COVID-19: Secondary | ICD-10-CM

## 2023-03-12 DIAGNOSIS — F05 Delirium due to known physiological condition: Secondary | ICD-10-CM | POA: Diagnosis not present

## 2023-03-12 DIAGNOSIS — J9601 Acute respiratory failure with hypoxia: Secondary | ICD-10-CM | POA: Diagnosis present

## 2023-03-12 DIAGNOSIS — D509 Iron deficiency anemia, unspecified: Secondary | ICD-10-CM | POA: Diagnosis present

## 2023-03-12 DIAGNOSIS — A419 Sepsis, unspecified organism: Principal | ICD-10-CM | POA: Diagnosis present

## 2023-03-12 DIAGNOSIS — I160 Hypertensive urgency: Secondary | ICD-10-CM | POA: Diagnosis not present

## 2023-03-12 DIAGNOSIS — I491 Atrial premature depolarization: Secondary | ICD-10-CM | POA: Diagnosis not present

## 2023-03-12 DIAGNOSIS — F1010 Alcohol abuse, uncomplicated: Secondary | ICD-10-CM | POA: Diagnosis present

## 2023-03-12 DIAGNOSIS — R001 Bradycardia, unspecified: Secondary | ICD-10-CM | POA: Diagnosis not present

## 2023-03-12 DIAGNOSIS — T17908A Unspecified foreign body in respiratory tract, part unspecified causing other injury, initial encounter: Secondary | ICD-10-CM

## 2023-03-12 DIAGNOSIS — Z86010 Personal history of colonic polyps: Secondary | ICD-10-CM

## 2023-03-12 DIAGNOSIS — Z79899 Other long term (current) drug therapy: Secondary | ICD-10-CM

## 2023-03-12 DIAGNOSIS — G9341 Metabolic encephalopathy: Secondary | ICD-10-CM | POA: Diagnosis not present

## 2023-03-12 DIAGNOSIS — I4891 Unspecified atrial fibrillation: Secondary | ICD-10-CM | POA: Diagnosis not present

## 2023-03-12 DIAGNOSIS — I10 Essential (primary) hypertension: Secondary | ICD-10-CM | POA: Diagnosis not present

## 2023-03-12 DIAGNOSIS — R6521 Severe sepsis with septic shock: Secondary | ICD-10-CM | POA: Diagnosis present

## 2023-03-12 DIAGNOSIS — I471 Supraventricular tachycardia, unspecified: Secondary | ICD-10-CM | POA: Diagnosis present

## 2023-03-12 DIAGNOSIS — Z781 Physical restraint status: Secondary | ICD-10-CM

## 2023-03-12 DIAGNOSIS — Z66 Do not resuscitate: Secondary | ICD-10-CM | POA: Diagnosis not present

## 2023-03-12 DIAGNOSIS — R000 Tachycardia, unspecified: Secondary | ICD-10-CM | POA: Diagnosis not present

## 2023-03-12 DIAGNOSIS — Z880 Allergy status to penicillin: Secondary | ICD-10-CM

## 2023-03-12 DIAGNOSIS — E871 Hypo-osmolality and hyponatremia: Secondary | ICD-10-CM | POA: Diagnosis present

## 2023-03-12 DIAGNOSIS — R918 Other nonspecific abnormal finding of lung field: Secondary | ICD-10-CM | POA: Diagnosis not present

## 2023-03-12 DIAGNOSIS — R319 Hematuria, unspecified: Secondary | ICD-10-CM | POA: Diagnosis not present

## 2023-03-12 DIAGNOSIS — T17428A Food in trachea causing other injury, initial encounter: Secondary | ICD-10-CM | POA: Diagnosis present

## 2023-03-12 DIAGNOSIS — K222 Esophageal obstruction: Secondary | ICD-10-CM | POA: Diagnosis present

## 2023-03-12 DIAGNOSIS — R0603 Acute respiratory distress: Secondary | ICD-10-CM | POA: Diagnosis not present

## 2023-03-12 DIAGNOSIS — J690 Pneumonitis due to inhalation of food and vomit: Secondary | ICD-10-CM | POA: Diagnosis present

## 2023-03-12 DIAGNOSIS — R739 Hyperglycemia, unspecified: Secondary | ICD-10-CM | POA: Diagnosis not present

## 2023-03-12 DIAGNOSIS — I110 Hypertensive heart disease with heart failure: Secondary | ICD-10-CM | POA: Diagnosis present

## 2023-03-12 DIAGNOSIS — F419 Anxiety disorder, unspecified: Secondary | ICD-10-CM | POA: Diagnosis present

## 2023-03-12 DIAGNOSIS — J810 Acute pulmonary edema: Secondary | ICD-10-CM | POA: Diagnosis present

## 2023-03-12 DIAGNOSIS — Z515 Encounter for palliative care: Secondary | ICD-10-CM

## 2023-03-12 DIAGNOSIS — E785 Hyperlipidemia, unspecified: Secondary | ICD-10-CM | POA: Diagnosis present

## 2023-03-12 DIAGNOSIS — Z4682 Encounter for fitting and adjustment of non-vascular catheter: Secondary | ICD-10-CM | POA: Diagnosis not present

## 2023-03-12 DIAGNOSIS — R140 Abdominal distension (gaseous): Secondary | ICD-10-CM | POA: Diagnosis not present

## 2023-03-12 DIAGNOSIS — I5032 Chronic diastolic (congestive) heart failure: Secondary | ICD-10-CM | POA: Diagnosis present

## 2023-03-12 DIAGNOSIS — E039 Hypothyroidism, unspecified: Secondary | ICD-10-CM | POA: Diagnosis present

## 2023-03-12 DIAGNOSIS — I21A1 Myocardial infarction type 2: Secondary | ICD-10-CM | POA: Diagnosis not present

## 2023-03-12 DIAGNOSIS — Z79890 Hormone replacement therapy (postmenopausal): Secondary | ICD-10-CM

## 2023-03-12 DIAGNOSIS — Z87891 Personal history of nicotine dependence: Secondary | ICD-10-CM

## 2023-03-12 DIAGNOSIS — Z8546 Personal history of malignant neoplasm of prostate: Secondary | ICD-10-CM

## 2023-03-12 DIAGNOSIS — I350 Nonrheumatic aortic (valve) stenosis: Secondary | ICD-10-CM | POA: Diagnosis present

## 2023-03-12 DIAGNOSIS — R0689 Other abnormalities of breathing: Secondary | ICD-10-CM | POA: Diagnosis not present

## 2023-03-12 DIAGNOSIS — R451 Restlessness and agitation: Secondary | ICD-10-CM | POA: Diagnosis present

## 2023-03-12 DIAGNOSIS — R269 Unspecified abnormalities of gait and mobility: Secondary | ICD-10-CM | POA: Diagnosis present

## 2023-03-12 LAB — BLOOD GAS, ARTERIAL
Acid-base deficit: 2.4 mmol/L — ABNORMAL HIGH (ref 0.0–2.0)
Bicarbonate: 24.6 mmol/L (ref 20.0–28.0)
Drawn by: 11249
FIO2: 100 %
MECHVT: 540 mL
O2 Saturation: 100 %
PEEP: 5 cmH2O
Patient temperature: 36.6
RATE: 16 {breaths}/min
pCO2 arterial: 49 mmHg — ABNORMAL HIGH (ref 32–48)
pH, Arterial: 7.31 — ABNORMAL LOW (ref 7.35–7.45)
pO2, Arterial: 272 mmHg — ABNORMAL HIGH (ref 83–108)

## 2023-03-12 LAB — CBC WITH DIFFERENTIAL/PLATELET
Abs Immature Granulocytes: 0.14 10*3/uL — ABNORMAL HIGH (ref 0.00–0.07)
Basophils Absolute: 0.2 10*3/uL — ABNORMAL HIGH (ref 0.0–0.1)
Basophils Relative: 2 %
Eosinophils Absolute: 0.5 10*3/uL (ref 0.0–0.5)
Eosinophils Relative: 4 %
HCT: 53.5 % — ABNORMAL HIGH (ref 39.0–52.0)
Hemoglobin: 18.5 g/dL — ABNORMAL HIGH (ref 13.0–17.0)
Immature Granulocytes: 1 %
Lymphocytes Relative: 29 %
Lymphs Abs: 3.3 10*3/uL (ref 0.7–4.0)
MCH: 36.7 pg — ABNORMAL HIGH (ref 26.0–34.0)
MCHC: 34.6 g/dL (ref 30.0–36.0)
MCV: 106.2 fL — ABNORMAL HIGH (ref 80.0–100.0)
Monocytes Absolute: 1.2 10*3/uL — ABNORMAL HIGH (ref 0.1–1.0)
Monocytes Relative: 10 %
Neutro Abs: 6.2 10*3/uL (ref 1.7–7.7)
Neutrophils Relative %: 54 %
Platelets: 269 10*3/uL (ref 150–400)
RBC: 5.04 MIL/uL (ref 4.22–5.81)
RDW: 12.8 % (ref 11.5–15.5)
WBC: 11.6 10*3/uL — ABNORMAL HIGH (ref 4.0–10.5)
nRBC: 0 % (ref 0.0–0.2)

## 2023-03-12 LAB — CBG MONITORING, ED: Glucose-Capillary: 178 mg/dL — ABNORMAL HIGH (ref 70–99)

## 2023-03-12 LAB — SARS CORONAVIRUS 2 BY RT PCR: SARS Coronavirus 2 by RT PCR: NEGATIVE

## 2023-03-12 MED ORDER — ETOMIDATE 2 MG/ML IV SOLN
INTRAVENOUS | Status: AC
  Administered 2023-03-12: 20 mg via INTRAVENOUS
  Filled 2023-03-12: qty 20

## 2023-03-12 MED ORDER — FENTANYL CITRATE PF 50 MCG/ML IJ SOSY
PREFILLED_SYRINGE | INTRAMUSCULAR | Status: AC
  Filled 2023-03-12: qty 2

## 2023-03-12 MED ORDER — KETAMINE HCL 50 MG/5ML IJ SOSY
PREFILLED_SYRINGE | INTRAMUSCULAR | Status: AC
  Filled 2023-03-12: qty 10

## 2023-03-12 MED ORDER — LEVOFLOXACIN IN D5W 750 MG/150ML IV SOLN
750.0000 mg | Freq: Once | INTRAVENOUS | Status: AC
  Administered 2023-03-12: 750 mg via INTRAVENOUS
  Filled 2023-03-12: qty 150

## 2023-03-12 MED ORDER — PROPOFOL 1000 MG/100ML IV EMUL
5.0000 ug/kg/min | INTRAVENOUS | Status: DC
  Administered 2023-03-13: 30 ug/kg/min via INTRAVENOUS
  Administered 2023-03-13: 34.803 ug/kg/min via INTRAVENOUS
  Filled 2023-03-12: qty 100

## 2023-03-12 MED ORDER — PROPOFOL 1000 MG/100ML IV EMUL
5.0000 ug/kg/min | INTRAVENOUS | Status: DC
  Administered 2023-03-12: 20 ug/kg/min via INTRAVENOUS

## 2023-03-12 MED ORDER — ROCURONIUM BROMIDE 10 MG/ML (PF) SYRINGE
PREFILLED_SYRINGE | INTRAVENOUS | Status: AC
  Filled 2023-03-12: qty 10

## 2023-03-12 MED ORDER — PROPOFOL 1000 MG/100ML IV EMUL
INTRAVENOUS | Status: AC
  Filled 2023-03-12: qty 100

## 2023-03-12 MED ORDER — MIDAZOLAM HCL 2 MG/2ML IJ SOLN
INTRAMUSCULAR | Status: AC
  Filled 2023-03-12: qty 2

## 2023-03-12 MED ORDER — SUCCINYLCHOLINE CHLORIDE 200 MG/10ML IV SOSY
PREFILLED_SYRINGE | INTRAVENOUS | Status: AC
  Administered 2023-03-12: 20 mg via INTRAVENOUS
  Filled 2023-03-12: qty 10

## 2023-03-12 MED ORDER — PROPOFOL 500 MG/50ML IV EMUL
INTRAVENOUS | Status: AC
  Filled 2023-03-12: qty 50

## 2023-03-12 MED ORDER — LACTATED RINGERS IV SOLN: INTRAVENOUS | Status: DC

## 2023-03-12 MED ORDER — METRONIDAZOLE 500 MG/100ML IV SOLN
500.0000 mg | Freq: Once | INTRAVENOUS | Status: AC
  Administered 2023-03-13: 500 mg via INTRAVENOUS
  Filled 2023-03-12: qty 100

## 2023-03-12 NOTE — ED Notes (Signed)
Patient intubated with start time approximately at 2200. MD, Respiratory and nursing staff present. Etomidate given followed by Propofol. Propofol has been titrated since to 40 due to restlessness. Staff unable to place OG tube at this time. Additional attempts to place held per MD.

## 2023-03-12 NOTE — ED Notes (Signed)
Could not get blood cultures prior to starting antibiotics

## 2023-03-12 NOTE — ED Provider Notes (Signed)
[place] EMERGENCY DEPARTMENT AT [facility] Provider Note   CSN: [id number] Arrival date & time: 03/12/23  2151     History  Chief Complaint  Patient presents with   Respiratory Distress    Scott Newman is a 77 y.o. male.  HPI Patient's wife reports that the patient has an esophageal stricture and sometimes he has trouble swallowing.  Occasionally he aspirates but can usually handle it and clear things.  Has not had any problems with his respiratory status or recurrent pneumonia.  He was due to have a special inpatient upper endoscopy with GI.  She reports the patient had been eating some saltine crackers this evening because he has recently had some hyponatremia.  She reports he had eaten some crackers and then he felt like his stomach was really uncomfortable but he was not really choking on them at that time.  She went out of the room and later heard him coughing intensely.  She went in and he was coughing and very red in the face.  She reports initially he did not want her to call EMS but he continued to cough and strain and could not seem to get a deep breath and then he wanted EMS called.  Patient's wife reports she thought he might vomit but he did not.  By the time EMS arrived, patient was extremely agitated.  They could not really get a initial oxygen saturation.  The patient was struggling and trying to sit up and move all over the place.  I could not obtain any history from the patient he was in respiratory distress on arrival.    Home Medications Prior to Admission medications   Not on File      Allergies    Patient has no allergy information on record.    Review of Systems   Review of Systems  Physical Exam Updated Vital Signs Ht 5\' 8"  (1.727 m)   SpO2 99%  Physical Exam Constitutional:      Comments: Patient has extreme respiratory distress with gurgling sounding respirations and moaning.  He is face is very red and he is extremely anxious.   Diaphoretic.  HENT:     Mouth/Throat:     Comments: Upper airway is clear.  There is no evident visual obstruction or pooling up of secretions from initial oral exam. Cardiovascular:     Comments: Tachycardia.  Grossly regular. Pulmonary:     Comments: Extreme respiratory distress.  Harsh crackle and wheeze throughout lung fields bilaterally. Abdominal:     Comments: Abdomen mildly distended with increased work of breathing.  Musculoskeletal:     Comments: Peripheral edema.  Lower legs and calves are symmetric.  Skin:    Comments: Warm and diaphoretic.  Neurological:     Comments: Patient is very agitated.  He is moving 4 extremities.     ED Results / Procedures / Treatments   Labs (all labs ordered are listed, but only abnormal results are displayed) Labs Reviewed  CBG MONITORING, ED - Abnormal; Notable for the following components:      Result Value   Glucose-Capillary 178 (*)    All other components within normal limits  SARS CORONAVIRUS 2 BY RT PCR  CULTURE, BLOOD (ROUTINE X 2)  CULTURE, BLOOD (ROUTINE X 2)  BRAIN NATRIURETIC PEPTIDE  COMPREHENSIVE METABOLIC PANEL  CBC WITH DIFFERENTIAL/PLATELET  URINALYSIS, ROUTINE W REFLEX MICROSCOPIC  BLOOD GAS, ARTERIAL  MAGNESIUM  PHOSPHORUS  I-STAT CHEM 8, ED  I-STAT CG4 LACTIC  ACID, ED  TROPONIN I (HIGH SENSITIVITY)    EKG None  Radiology No results found.  Procedures Procedure Name: Intubation Date/Time: 03/12/2023 10:31 PM  Performed by: Arby Barrette, MDPre-anesthesia Checklist: Patient being monitored, Emergency Drugs available and Suction available Oxygen Delivery Method: Ambu bag Preoxygenation: Pre-oxygenation with 100% oxygen Induction Type: Rapid sequence Ventilation: Mask ventilation without difficulty Laryngoscope Size: Glidescope and 4 Tube size: 7.5 mm Number of attempts: 1 Placement Confirmation: ETT inserted through vocal cords under direct vision, Positive ETCO2, CO2 detector and Breath sounds  checked- equal and bilateral Dental Injury: Teeth and Oropharynx as per pre-operative assessment  Comments: Patient intubated without difficulty.  There was some frothy secretion and slightly creamy colored mucus in the posterior airway.  Glottis was open without any obvious obstruction at the level visible with intubation.  No oxygen desaturation during intubation.  Postintubation breath sounds are symmetric.     CRITICAL CARE Performed by: Arby Barrette   Total critical care time: 30 minutes  Critical care time was exclusive of separately billable procedures and treating other patients.  Critical care was necessary to treat or prevent imminent or life-threatening deterioration.  Critical care was time spent personally by me on the following activities: development of treatment plan with patient and/or surrogate as well as nursing, discussions with consultants, evaluation of patient's response to treatment, examination of patient, obtaining history from patient or surrogate, ordering and performing treatments and interventions, ordering and review of laboratory studies, ordering and review of radiographic studies, pulse oximetry and re-evaluation of patient's condition.    Medications Ordered in ED Medications  succinylcholine (ANECTINE) 200 MG/10ML syringe (has no administration in time range)  etomidate (AMIDATE) 2 MG/ML injection (has no administration in time range)  rocuronium bromide 100 MG/10ML SOSY (has no administration in time range)  midazolam (VERSED) 2 MG/2ML injection (has no administration in time range)  fentaNYL (SUBLIMAZE) 50 MCG/ML injection (has no administration in time range)  ketamine HCl 50 MG/5ML SOSY (has no administration in time range)  propofol (DIPRIVAN) 1000 MG/100ML infusion (has no administration in time range)  lactated ringers infusion (has no administration in time range)  metroNIDAZOLE (FLAGYL) IVPB 500 mg (has no administration in time range)   levofloxacin (LEVAQUIN) IVPB 750 mg (has no administration in time range)    ED Course/ Medical Decision Making/ A&P                                 Medical Decision Making Amount and/or Complexity of Data Reviewed Radiology: ordered.   Patient presented in extreme respiratory distress.  By history it is consistent with aspiration.  Patient's wife reports that he is never had any respiratory or pulmonary problems.  He has had difficulty with an esophageal stricture and some swallowing problems although not had recurrent aspiration and pneumonia.  Patient was straining and coughing.  He was also very agitated and required intubation for airway protection and agitation.  When patient would remove his nonrebreather mask, oxygen saturations would dip as low as 70s.  With nonrebreather mask kept in good contact oxygen saturation will come up to 100%.  Bedside postintubation x-ray reviewed by myself.  ET tube at the level of the clavicles.  Both lungs are inflated.  There is a large developing medial infiltrate on the right and some diffuse appearing infiltrate in upper lobes.  At this point, with aspiration and infiltrates appearing on chest x-ray will opt to  initiate antibiotics.  Patient's wife describes a fairly significant penicillin allergy with developing full body rash.  She is uncertain about Keflex which is also listed as an allergy.  Will opt for combination of Levaquin and Flagyl for aspiration.        Final Clinical Impression(s) / ED Diagnoses Final diagnoses:  Acute respiratory failure with hypoxia (HCC)  Aspiration into airway, initial encounter    Rx / DC Orders ED Discharge Orders     None         Arby Barrette, MD 03/12/23 2236

## 2023-03-12 NOTE — ED Notes (Signed)
500 NACL started as per Arby Barrette MD

## 2023-03-12 NOTE — ED Triage Notes (Addendum)
Patient presents with tachycardia, tachypnea, diapheresis, hypertension and shortness of breath.  Eating crakers when this started per wife 45 mins ago.     HX: Narrow esophagus    EMS vitals: 200/100 BP 136 HR 50 RR

## 2023-03-13 ENCOUNTER — Inpatient Hospital Stay (HOSPITAL_COMMUNITY): Payer: PPO

## 2023-03-13 DIAGNOSIS — R1310 Dysphagia, unspecified: Secondary | ICD-10-CM | POA: Diagnosis not present

## 2023-03-13 DIAGNOSIS — J90 Pleural effusion, not elsewhere classified: Secondary | ICD-10-CM | POA: Diagnosis not present

## 2023-03-13 DIAGNOSIS — R627 Adult failure to thrive: Secondary | ICD-10-CM | POA: Diagnosis not present

## 2023-03-13 DIAGNOSIS — J9601 Acute respiratory failure with hypoxia: Secondary | ICD-10-CM | POA: Diagnosis present

## 2023-03-13 DIAGNOSIS — T17908A Unspecified foreign body in respiratory tract, part unspecified causing other injury, initial encounter: Secondary | ICD-10-CM | POA: Diagnosis not present

## 2023-03-13 DIAGNOSIS — I350 Nonrheumatic aortic (valve) stenosis: Secondary | ICD-10-CM | POA: Diagnosis not present

## 2023-03-13 DIAGNOSIS — Z4682 Encounter for fitting and adjustment of non-vascular catheter: Secondary | ICD-10-CM | POA: Diagnosis not present

## 2023-03-13 DIAGNOSIS — I4891 Unspecified atrial fibrillation: Secondary | ICD-10-CM | POA: Diagnosis not present

## 2023-03-13 DIAGNOSIS — I5032 Chronic diastolic (congestive) heart failure: Secondary | ICD-10-CM | POA: Diagnosis not present

## 2023-03-13 DIAGNOSIS — E785 Hyperlipidemia, unspecified: Secondary | ICD-10-CM | POA: Diagnosis not present

## 2023-03-13 DIAGNOSIS — G9341 Metabolic encephalopathy: Secondary | ICD-10-CM | POA: Diagnosis not present

## 2023-03-13 DIAGNOSIS — I110 Hypertensive heart disease with heart failure: Secondary | ICD-10-CM | POA: Diagnosis not present

## 2023-03-13 DIAGNOSIS — J810 Acute pulmonary edema: Secondary | ICD-10-CM | POA: Diagnosis not present

## 2023-03-13 DIAGNOSIS — Z931 Gastrostomy status: Secondary | ICD-10-CM | POA: Diagnosis not present

## 2023-03-13 DIAGNOSIS — E039 Hypothyroidism, unspecified: Secondary | ICD-10-CM | POA: Diagnosis not present

## 2023-03-13 DIAGNOSIS — R6521 Severe sepsis with septic shock: Secondary | ICD-10-CM | POA: Diagnosis not present

## 2023-03-13 DIAGNOSIS — J9690 Respiratory failure, unspecified, unspecified whether with hypoxia or hypercapnia: Secondary | ICD-10-CM | POA: Diagnosis not present

## 2023-03-13 DIAGNOSIS — Z515 Encounter for palliative care: Secondary | ICD-10-CM | POA: Diagnosis not present

## 2023-03-13 DIAGNOSIS — I21A1 Myocardial infarction type 2: Secondary | ICD-10-CM | POA: Diagnosis not present

## 2023-03-13 DIAGNOSIS — T17428A Food in trachea causing other injury, initial encounter: Secondary | ICD-10-CM | POA: Diagnosis not present

## 2023-03-13 DIAGNOSIS — J690 Pneumonitis due to inhalation of food and vomit: Secondary | ICD-10-CM | POA: Diagnosis not present

## 2023-03-13 DIAGNOSIS — F1010 Alcohol abuse, uncomplicated: Secondary | ICD-10-CM | POA: Diagnosis not present

## 2023-03-13 DIAGNOSIS — K222 Esophageal obstruction: Secondary | ICD-10-CM | POA: Diagnosis not present

## 2023-03-13 DIAGNOSIS — R7989 Other specified abnormal findings of blood chemistry: Secondary | ICD-10-CM | POA: Diagnosis not present

## 2023-03-13 DIAGNOSIS — Z1152 Encounter for screening for COVID-19: Secondary | ICD-10-CM | POA: Diagnosis not present

## 2023-03-13 DIAGNOSIS — D509 Iron deficiency anemia, unspecified: Secondary | ICD-10-CM | POA: Diagnosis not present

## 2023-03-13 DIAGNOSIS — I471 Supraventricular tachycardia, unspecified: Secondary | ICD-10-CM | POA: Diagnosis not present

## 2023-03-13 DIAGNOSIS — E871 Hypo-osmolality and hyponatremia: Secondary | ICD-10-CM | POA: Diagnosis not present

## 2023-03-13 DIAGNOSIS — J9811 Atelectasis: Secondary | ICD-10-CM | POA: Diagnosis not present

## 2023-03-13 DIAGNOSIS — A419 Sepsis, unspecified organism: Secondary | ICD-10-CM | POA: Diagnosis not present

## 2023-03-13 DIAGNOSIS — F05 Delirium due to known physiological condition: Secondary | ICD-10-CM | POA: Diagnosis not present

## 2023-03-13 DIAGNOSIS — Z66 Do not resuscitate: Secondary | ICD-10-CM | POA: Diagnosis not present

## 2023-03-13 DIAGNOSIS — I480 Paroxysmal atrial fibrillation: Secondary | ICD-10-CM | POA: Diagnosis not present

## 2023-03-13 DIAGNOSIS — I160 Hypertensive urgency: Secondary | ICD-10-CM | POA: Diagnosis not present

## 2023-03-13 LAB — GLUCOSE, CAPILLARY
Glucose-Capillary: 154 mg/dL — ABNORMAL HIGH (ref 70–99)
Glucose-Capillary: 161 mg/dL — ABNORMAL HIGH (ref 70–99)
Glucose-Capillary: 176 mg/dL — ABNORMAL HIGH (ref 70–99)
Glucose-Capillary: 177 mg/dL — ABNORMAL HIGH (ref 70–99)
Glucose-Capillary: 182 mg/dL — ABNORMAL HIGH (ref 70–99)

## 2023-03-13 LAB — COMPREHENSIVE METABOLIC PANEL
ALT: 61 U/L — ABNORMAL HIGH (ref 0–44)
AST: 68 U/L — ABNORMAL HIGH (ref 15–41)
Albumin: 4.6 g/dL (ref 3.5–5.0)
Alkaline Phosphatase: 85 U/L (ref 38–126)
Anion gap: 12 (ref 5–15)
BUN: 6 mg/dL — ABNORMAL LOW (ref 8–23)
CO2: 24 mmol/L (ref 22–32)
Calcium: 9.4 mg/dL (ref 8.9–10.3)
Chloride: 93 mmol/L — ABNORMAL LOW (ref 98–111)
Creatinine, Ser: 1.01 mg/dL (ref 0.61–1.24)
GFR, Estimated: >60 mL/min (ref >60)
Glucose, Bld: 162 mg/dL — ABNORMAL HIGH (ref 70–99)
Potassium: 3.5 mmol/L (ref 3.5–5.1)
Sodium: 129 mmol/L — ABNORMAL LOW (ref 135–145)
Total Bilirubin: 1.5 mg/dL — ABNORMAL HIGH (ref 0.3–1.2)
Total Protein: 9.1 g/dL — ABNORMAL HIGH (ref 6.5–8.1)

## 2023-03-13 LAB — URINALYSIS, ROUTINE W REFLEX MICROSCOPIC
Bacteria, UA: NONE SEEN
Bilirubin Urine: NEGATIVE
Glucose, UA: NEGATIVE mg/dL
Ketones, ur: NEGATIVE mg/dL
Leukocytes,Ua: NEGATIVE
Nitrite: NEGATIVE
Protein, ur: 100 mg/dL — AB
Specific Gravity, Urine: 1.01 (ref 1.005–1.030)
pH: 5 (ref 5.0–8.0)

## 2023-03-13 LAB — BASIC METABOLIC PANEL
Anion gap: 9 (ref 5–15)
BUN: 8 mg/dL (ref 8–23)
CO2: 22 mmol/L (ref 22–32)
Calcium: 8.8 mg/dL — ABNORMAL LOW (ref 8.9–10.3)
Chloride: 97 mmol/L — ABNORMAL LOW (ref 98–111)
Creatinine, Ser: 0.7 mg/dL (ref 0.61–1.24)
GFR, Estimated: >60 mL/min (ref >60)
Glucose, Bld: 141 mg/dL — ABNORMAL HIGH (ref 70–99)
Potassium: 4.4 mmol/L (ref 3.5–5.1)
Sodium: 128 mmol/L — ABNORMAL LOW (ref 135–145)

## 2023-03-13 LAB — BLOOD GAS, ARTERIAL
Acid-Base Excess: 1 mmol/L (ref 0.0–2.0)
Bicarbonate: 25.2 mmol/L (ref 20.0–28.0)
Drawn by: 11249
FIO2: 60 %
MECHVT: 540 mL
O2 Saturation: 99.4 %
PEEP: 5 cmH2O
Patient temperature: 35.9
RATE: 16 {breaths}/min
pCO2 arterial: 36 mmHg (ref 32–48)
pH, Arterial: 7.45 (ref 7.35–7.45)
pO2, Arterial: 102 mmHg (ref 83–108)

## 2023-03-13 LAB — DIC (DISSEMINATED INTRAVASCULAR COAGULATION)PANEL
D-Dimer, Quant: 0.96 ug{FEU}/mL — ABNORMAL HIGH (ref 0.00–0.50)
Fibrinogen: 297 mg/dL (ref 210–475)
INR: 1.2 (ref 0.8–1.2)
Platelets: 159 10*3/uL (ref 150–400)
Prothrombin Time: 15 s (ref 11.4–15.2)
Smear Review: NONE SEEN
aPTT: 66 s — ABNORMAL HIGH (ref 24–36)

## 2023-03-13 LAB — CBC
HCT: 41.7 % (ref 39.0–52.0)
HCT: 41.2 % (ref 39.0–52.0)
Hemoglobin: 14.6 g/dL (ref 13.0–17.0)
Hemoglobin: 14.2 g/dL (ref 13.0–17.0)
MCH: 36.9 pg — ABNORMAL HIGH (ref 26.0–34.0)
MCH: 36.3 pg — ABNORMAL HIGH (ref 26.0–34.0)
MCHC: 35 g/dL (ref 30.0–36.0)
MCHC: 34.5 g/dL (ref 30.0–36.0)
MCV: 105.3 fL — ABNORMAL HIGH (ref 80.0–100.0)
MCV: 105.4 fL — ABNORMAL HIGH (ref 80.0–100.0)
Platelets: 160 10*3/uL (ref 150–400)
Platelets: 160 10*3/uL (ref 150–400)
RBC: 3.96 MIL/uL — ABNORMAL LOW (ref 4.22–5.81)
RBC: 3.91 MIL/uL — ABNORMAL LOW (ref 4.22–5.81)
RDW: 12.7 % (ref 11.5–15.5)
RDW: 12.7 % (ref 11.5–15.5)
WBC: 10.6 10*3/uL — ABNORMAL HIGH (ref 4.0–10.5)
WBC: 9.3 10*3/uL (ref 4.0–10.5)
nRBC: 0 % (ref 0.0–0.2)
nRBC: 0 % (ref 0.0–0.2)

## 2023-03-13 LAB — HEPARIN LEVEL (UNFRACTIONATED)
Heparin Unfractionated: 0.21 [IU]/mL — ABNORMAL LOW (ref 0.30–0.70)
Heparin Unfractionated: 0.42 [IU]/mL (ref 0.30–0.70)

## 2023-03-13 LAB — TROPONIN I (HIGH SENSITIVITY)
Troponin I (High Sensitivity): 1047 ng/L (ref <18)
Troponin I (High Sensitivity): 1364 ng/L (ref <18)
Troponin I (High Sensitivity): 1692 ng/L (ref <18)
Troponin I (High Sensitivity): 353 ng/L (ref <18)
Troponin I (High Sensitivity): 82 ng/L — ABNORMAL HIGH (ref <18)

## 2023-03-13 LAB — ECHOCARDIOGRAM COMPLETE
AR max vel: 0.59 cm2
AV Area VTI: 0.54 cm2
AV Area mean vel: 0.53 cm2
AV Mean grad: 43.5 mmHg
AV Peak grad: 72.1 mmHg
Ao pk vel: 4.25 m/s
Area-P 1/2: 2.07 cm2
Height: 68 in
P 1/2 time: 333 ms
S' Lateral: 3.1 cm
Weight: 3065.28 [oz_av]

## 2023-03-13 LAB — MAGNESIUM
Magnesium: 2.4 mg/dL (ref 1.7–2.4)
Magnesium: 2 mg/dL (ref 1.7–2.4)

## 2023-03-13 LAB — PHOSPHORUS
Phosphorus: 5.3 mg/dL — ABNORMAL HIGH (ref 2.5–4.6)
Phosphorus: 3.9 mg/dL (ref 2.5–4.6)

## 2023-03-13 LAB — I-STAT CG4 LACTIC ACID, ED: Lactic Acid, Venous: 1.6 mmol/L (ref 0.5–1.9)

## 2023-03-13 LAB — CREATININE, SERUM
Creatinine, Ser: 0.71 mg/dL (ref 0.61–1.24)
GFR, Estimated: >60 mL/min (ref >60)

## 2023-03-13 LAB — BRAIN NATRIURETIC PEPTIDE: B Natriuretic Peptide: 1291.8 pg/mL — ABNORMAL HIGH (ref 0.0–100.0)

## 2023-03-13 MED ORDER — FAMOTIDINE 20 MG PO TABS: 20.0000 mg | ORAL_TABLET | Freq: Two times a day (BID) | ORAL | Status: DC

## 2023-03-13 MED ORDER — CHLORHEXIDINE GLUCONATE CLOTH 2 % EX PADS
6.0000 | MEDICATED_PAD | Freq: Every day | CUTANEOUS | Status: DC
  Administered 2023-03-14 – 2023-03-15 (×2): 6 via TOPICAL

## 2023-03-13 MED ORDER — ALBUTEROL SULFATE (2.5 MG/3ML) 0.083% IN NEBU
2.5000 mg | INHALATION_SOLUTION | RESPIRATORY_TRACT | Status: DC | PRN
  Administered 2023-03-17: 2.5 mg via RESPIRATORY_TRACT
  Filled 2023-03-13: qty 3

## 2023-03-13 MED ORDER — DOPAMINE-DEXTROSE 3.2-5 MG/ML-% IV SOLN: 0.0000 ug/kg/min | INTRAVENOUS | Status: DC

## 2023-03-13 MED ORDER — POLYETHYLENE GLYCOL 3350 17 G PO PACK
17.0000 g | PACK | Freq: Every day | ORAL | Status: DC
  Administered 2023-03-14 – 2023-03-15 (×2): 17 g
  Filled 2023-03-13 (×3): qty 1

## 2023-03-13 MED ORDER — FENTANYL CITRATE PF 50 MCG/ML IJ SOSY
50.0000 ug | PREFILLED_SYRINGE | Freq: Once | INTRAMUSCULAR | Status: AC
  Administered 2023-03-13: 50 ug via INTRAVENOUS
  Filled 2023-03-13: qty 1

## 2023-03-13 MED ORDER — ENOXAPARIN SODIUM 30 MG/0.3ML IJ SOSY: 30.0000 mg | PREFILLED_SYRINGE | INTRAMUSCULAR | Status: DC

## 2023-03-13 MED ORDER — DOCUSATE SODIUM 100 MG PO CAPS: 100.0000 mg | ORAL_CAPSULE | Freq: Two times a day (BID) | ORAL | Status: DC | PRN

## 2023-03-13 MED ORDER — NOREPINEPHRINE 4 MG/250ML-% IV SOLN
INTRAVENOUS | Status: AC
  Administered 2023-03-13: 4 mg
  Filled 2023-03-13: qty 250

## 2023-03-13 MED ORDER — LACTATED RINGERS IV BOLUS
1000.0000 mL | Freq: Once | INTRAVENOUS | Status: AC
  Administered 2023-03-13: 1000 mL via INTRAVENOUS

## 2023-03-13 MED ORDER — ONDANSETRON HCL 4 MG/2ML IJ SOLN: 4.0000 mg | Freq: Four times a day (QID) | INTRAMUSCULAR | Status: DC | PRN

## 2023-03-13 MED ORDER — HEPARIN (PORCINE) 25000 UT/250ML-% IV SOLN
1500.0000 [IU]/h | INTRAVENOUS | Status: DC
  Administered 2023-03-13: 1200 [IU]/h via INTRAVENOUS
  Administered 2023-03-14: 1500 [IU]/h via INTRAVENOUS
  Administered 2023-03-14: 1350 [IU]/h via INTRAVENOUS
  Filled 2023-03-13 (×3): qty 250

## 2023-03-13 MED ORDER — ORAL CARE MOUTH RINSE: 15.0000 mL | OROMUCOSAL | Status: DC | PRN

## 2023-03-13 MED ORDER — ORAL CARE MOUTH RINSE
15.0000 mL | OROMUCOSAL | Status: DC
  Administered 2023-03-13 – 2023-03-15 (×24): 15 mL via OROMUCOSAL

## 2023-03-13 MED ORDER — ETOMIDATE 2 MG/ML IV SOLN: 20.0000 mg | Freq: Once | INTRAVENOUS | Status: AC

## 2023-03-13 MED ORDER — NOREPINEPHRINE 4 MG/250ML-% IV SOLN
2.0000 ug/min | INTRAVENOUS | Status: DC
  Administered 2023-03-13: 10 ug/min via INTRAVENOUS
  Administered 2023-03-14: 2 ug/min via INTRAVENOUS
  Filled 2023-03-13 (×3): qty 250

## 2023-03-13 MED ORDER — DOCUSATE SODIUM 50 MG/5ML PO LIQD
100.0000 mg | Freq: Two times a day (BID) | ORAL | Status: DC
  Administered 2023-03-13 – 2023-03-15 (×5): 100 mg
  Filled 2023-03-13 (×5): qty 10

## 2023-03-13 MED ORDER — SODIUM CHLORIDE 0.9 % IV SOLN
3.0000 g | Freq: Four times a day (QID) | INTRAVENOUS | Status: DC
  Administered 2023-03-13 – 2023-03-17 (×18): 3 g via INTRAVENOUS
  Filled 2023-03-13 (×19): qty 8

## 2023-03-13 MED ORDER — SODIUM CHLORIDE 0.9 % IV SOLN
250.0000 mL | INTRAVENOUS | Status: DC
  Administered 2023-03-15 – 2023-03-18 (×3): 250 mL via INTRAVENOUS

## 2023-03-13 MED ORDER — FENTANYL BOLUS VIA INFUSION
50.0000 ug | INTRAVENOUS | Status: DC | PRN
  Administered 2023-03-14: 50 ug via INTRAVENOUS
  Administered 2023-03-14: 100 ug via INTRAVENOUS
  Administered 2023-03-14 (×2): 50 ug via INTRAVENOUS
  Administered 2023-03-14: 75 ug via INTRAVENOUS
  Administered 2023-03-14: 50 ug via INTRAVENOUS
  Administered 2023-03-14 – 2023-03-15 (×2): 100 ug via INTRAVENOUS

## 2023-03-13 MED ORDER — NOREPINEPHRINE 4 MG/250ML-% IV SOLN: 0.0000 ug/min | INTRAVENOUS | Status: DC

## 2023-03-13 MED ORDER — FENTANYL 2500MCG IN NS 250ML (10MCG/ML) PREMIX INFUSION
50.0000 ug/h | INTRAVENOUS | Status: DC
  Administered 2023-03-13: 75 ug/h via INTRAVENOUS
  Administered 2023-03-13: 100 ug/h via INTRAVENOUS
  Administered 2023-03-14: 175 ug/h via INTRAVENOUS
  Filled 2023-03-13 (×3): qty 250

## 2023-03-13 MED ORDER — DEXMEDETOMIDINE HCL IN NACL 400 MCG/100ML IV SOLN
0.0000 ug/kg/h | INTRAVENOUS | Status: DC
  Administered 2023-03-13: 0.5 ug/kg/h via INTRAVENOUS
  Administered 2023-03-13: 0.4 ug/kg/h via INTRAVENOUS
  Administered 2023-03-13: 0.6 ug/kg/h via INTRAVENOUS
  Administered 2023-03-14: 0.4 ug/kg/h via INTRAVENOUS
  Administered 2023-03-14: 0.5 ug/kg/h via INTRAVENOUS
  Administered 2023-03-15: 0.4 ug/kg/h via INTRAVENOUS
  Administered 2023-03-15: 1 ug/kg/h via INTRAVENOUS
  Administered 2023-03-15: 1.1 ug/kg/h via INTRAVENOUS
  Administered 2023-03-16 (×2): 0.5 ug/kg/h via INTRAVENOUS
  Filled 2023-03-13 (×10): qty 100

## 2023-03-13 MED ORDER — SODIUM CHLORIDE 0.9 % IV SOLN
1.5000 g | Freq: Four times a day (QID) | INTRAVENOUS | Status: DC
  Filled 2023-03-13: qty 4

## 2023-03-13 MED ORDER — POLYETHYLENE GLYCOL 3350 17 G PO PACK: 17.0000 g | PACK | Freq: Every day | ORAL | Status: DC | PRN

## 2023-03-13 MED ORDER — SUCCINYLCHOLINE CHLORIDE 200 MG/10ML IV SOSY: 20.0000 mg | PREFILLED_SYRINGE | Freq: Once | INTRAVENOUS | Status: AC

## 2023-03-13 MED ORDER — ALBUTEROL SULFATE (2.5 MG/3ML) 0.083% IN NEBU
2.5000 mg | INHALATION_SOLUTION | RESPIRATORY_TRACT | Status: DC
  Administered 2023-03-13 (×4): 2.5 mg via RESPIRATORY_TRACT
  Filled 2023-03-13 (×4): qty 3

## 2023-03-13 MED ORDER — VASOPRESSIN 20 UNITS/100 ML INFUSION FOR SHOCK
0.0000 [IU]/min | INTRAVENOUS | Status: DC
  Filled 2023-03-13: qty 100

## 2023-03-13 MED ORDER — FAMOTIDINE 20 MG PO TABS
20.0000 mg | ORAL_TABLET | Freq: Two times a day (BID) | ORAL | Status: DC
  Administered 2023-03-13 (×2): 20 mg
  Filled 2023-03-13 (×2): qty 1

## 2023-03-13 NOTE — Plan of Care (Signed)
  Problem: Education: Goal: Knowledge of General Education information will improve Description: Including pain rating scale, medication(s)/side effects and non-pharmacologic comfort measures Outcome: Not Progressing   Problem: Health Behavior/Discharge Planning: Goal: Ability to manage health-related needs will improve Outcome: Not Progressing   Problem: Clinical Measurements: Goal: Ability to maintain clinical measurements within normal limits will improve Outcome: Not Progressing Goal: Will remain free from infection Outcome: Not Progressing Goal: Diagnostic test results will improve Outcome: Not Progressing Goal: Respiratory complications will improve Outcome: Not Progressing Goal: Cardiovascular complication will be avoided Outcome: Not Progressing   Problem: Activity: Goal: Risk for activity intolerance will decrease Outcome: Not Progressing   Problem: Nutrition: Goal: Adequate nutrition will be maintained Outcome: Not Progressing   Problem: Elimination: Goal: Will not experience complications related to bowel motility Outcome: Not Progressing Goal: Will not experience complications related to urinary retention Outcome: Not Progressing

## 2023-03-13 NOTE — Progress Notes (Addendum)
ANTICOAGULATION CONSULT NOTE   Pharmacy Consult for heparin Indication: chest pain/ACS  No Known Allergies  Patient Measurements: Height: 5\' 8"  (172.7 cm) Weight: 86.9 kg (191 lb 9.3 oz) IBW/kg (Calculated) : 68.4 Heparin Dosing Weight: 86 kg  Vital Signs: Temp: 98.4 F (36.9 C) (09/01 1215) Temp Source: Bladder (09/01 0800) BP: 108/45 (09/01 1215) Pulse Rate: 62 (09/01 1215)  Labs: Recent Labs    03/12/23 2200 03/13/23 0017 03/13/23 0304 03/13/23 0459 03/13/23 0935 03/13/23 1222 03/13/23 1437  HGB 18.5*  --  14.6 14.2  --   --   --   HCT 53.5*  --  41.7 41.2  --   --   --   PLT 269  --  160 160  --  159  --   APTT  --   --   --   --   --  66*  --   LABPROT  --   --   --   --   --  15.0  --   INR  --   --   --   --   --  1.2  --   HEPARINUNFRC  --   --   --   --   --   --  0.21*  CREATININE 1.01  --  0.71 0.70  --   --   --   TROPONINIHS 82* 353*  --  1,047* 1,364*  --   --     Estimated Creatinine Clearance: 81.6 mL/min (by C-G formula based on SCr of 0.7 mg/dL).   Assessment: Patient is a 77 y.o M with hx upper esophageal stricture  who presented to the ED on 03/12/23 with respiratory distress. Troponin is noted to be elevated. Pharmacy has been consulted to dose heparin drip for ACS.  Today, 03/13/2023: -Heparin level 0.21 - subtherapeutic on heparin infusion at 1200 units/hr -CBC ok -Some blood noted in NGT following intubation this morning. Suspected trauma related from insertion and pink urine noted. -Per report from RN, bleeding from gums when brushing teeth and blood noted in foley. -Discussed with CCMD - given increase in troponin, would like to continue heparin but monitor for worsening bleeding and need to stop heparin  Goal of Therapy:  Heparin level 0.3-0.7 units/ml Monitor platelets by anticoagulation protocol: Yes   Plan:  -Increase heparin infusion to 1350 units/hr (defer bolus given some bleeding noted) -Recheck 6 hr heparin level -Daily  CBC -Monitor for new or worsening bleeding  Pricilla Riffle, PharmD, BCPS Clinical Pharmacist 03/13/2023 3:43 PM

## 2023-03-13 NOTE — Progress Notes (Signed)
PHARMACY NOTE:  ANTIMICROBIAL RENAL DOSAGE ADJUSTMENT  Current antimicrobial regimen includes a mismatch between antimicrobial dosage and estimated renal function.  As per policy approved by the Pharmacy & Therapeutics and Medical Executive Committees, the antimicrobial dosage will be adjusted accordingly.  Current antimicrobial dosage:  unasyn 1.5 gm q6h  Indication: asp pna  Renal Function:  Estimated Creatinine Clearance: 81.6 mL/min (by C-G formula based on SCr of 0.7 mg/dL). []      On intermittent HD, scheduled: []      On CRRT    Antimicrobial dosage has been changed to:  unasyn 3gm q6h   Thank you for allowing pharmacy to be a part of this patient's care.  Lucia Gaskins, [facility] 03/13/2023 6:47 AM

## 2023-03-13 NOTE — Progress Notes (Signed)
Brief PCCM progress note:  Serial checks during the day for hemodynamics as well as ventilatory stability.  Gradual weaning of oxygen through the day.  Gradually wean off vasopressors throughout the day low-dose.  Sedation seems reasonable and appropriate.  Continue antibiotics for aspiration pneumonia.  Troponin slowly rising.  TTE without focal wall motion abnormalities.  Suspect demand mismatch given structural heart disease, valvular abnormality, severe sepsis/septic shock, hypoxemia on the ventilator.  Plan to continue heparin GTT 48 hours for treatment of possible NSTEMI.  Plan of care discussed in detail with wife at bedside.

## 2023-03-13 NOTE — H&P (Signed)
NAME:  Scott Newman, MRN:  [id number], DOB:  1945-08-19, LOS: 0 ADMISSION DATE:  03/12/2023, CONSULTATION DATE:  03/13/2023  REFERRING MD:  Dr Clarice Pole, CHIEF COMPLAINT: Respiratory failure on mechanical support  History of Present Illness:  This is a 77 year old gentleman, past medical history of upper esophageal stricture, recently seen by [facility] with colonoscopy and upper endoscopy.  They were unable to do the upper endoscopy at their endoscopy center.  Recommended having this done in the hospital.  History obtained from the wife.  Patient has chronic hyponatremia.  Wife states that the patient has been taking salted crackers daily to make sure he maintains his sodium level.  He got choked her this evening feeling like the crackers could not go down.  Per the patient's wife he got very red in the face.  Attempted to have trouble clearing his secretions.  Ultimately they made the decision to come to the emergency room.  After calling EMS.  In the emergency room was found to be hypoxemic into the 70s and decision was made for endotracheal intubation.  Chest x-ray revealed bilateral pulmonary infiltrates.  Pertinent  Medical History  No past medical history on file.   Significant Hospital Events: Including procedures, antibiotic start and stop dates in addition to other pertinent events     Interim History / Subjective:  Per HPI above  Objective   Blood pressure (!) 94/56, pulse 64, temperature (!) 96.7 F (35.9 C), resp. rate 17, height 5\' 8"  (1.727 m), weight 86.2 kg, SpO2 97%.    Vent Mode: PRVC FiO2 (%):  [60 %-100 %] 60 % Set Rate:  [16 bmp] 16 bmp Vt Set:  [540 mL] 540 mL PEEP:  [5 cmH20] 5 cmH20 Plateau Pressure:  [16 cmH20-20 cmH20] 16 cmH20   Intake/Output Summary (Last 24 hours) at 03/13/2023 0305 Last data filed at 03/13/2023 0221 Gross per 24 hour  Intake 243.69 ml  Output --  Net 243.69 ml   Filed Weights   03/12/23 2309  Weight: 86.2 kg     Examination: General: Elderly gentleman intubated on mechanical life support HENT: NCAT, endotracheal tube in place Lungs: Bilateral mechanically ventilated breath sounds Cardiovascular: Regular rate rhythm S1-S2 Abdomen: Obese soft nontender nondistended Extremities: Lower extremity chronic skin changes, no significant edema Neuro: Sedated on mechanical support GU: Deferred  Resolved Hospital Problem list     Assessment & Plan:   Acute aspiration History of esophageal stricture, followed by [name] - Was recommended for him to have a repeat EGD in the hospital setting for consideration of dilation per patient's wife but patient did not want to have a repeat procedure complete. Acute hypoxemic respiratory failure requiring intubation mechanical ventilation Possible pulmonary edema related to negative pressure and choking, chest x-ray with bilateral infiltrates Elevated troponin, likely stress-induced, no EKG changes  Plan: Admit to the ICU PAD guideline sedation Blood pressures have been soft, stop propofol switch to fentanyl plus Precedex Start Unasyn Consult GI, [facility] in the morning. Repeat troponin, repeat EKG in the a.m., echocardiogram ordered If troponin continues to elevate would consider starting heparin and consulting cardiology in the morning. Start aspirin plus statin.   Best Practice (right click and "Reselect all SmartList Selections" daily)   Diet/type: NPO DVT prophylaxis: LMWH GI prophylaxis: H2B Lines: N/A Foley:  Yes, and it is still needed Code Status:  full code Last date of multidisciplinary goals of care discussion [met discussed plan with patient's wife at bedside.]  Labs   CBC:  Recent Labs  Lab 03/12/23 2200  WBC 11.6*  NEUTROABS 6.2  HGB 18.5*  HCT 53.5*  MCV 106.2*  PLT 269    Basic Metabolic Panel: Recent Labs  Lab 03/12/23 2200  NA 129*  K 3.5  CL 93*  CO2 24  GLUCOSE 162*  BUN 6*  CREATININE 1.01  CALCIUM 9.4  MG  2.4  PHOS 5.3*   GFR: Estimated Creatinine Clearance: 64.4 mL/min (by C-G formula based on SCr of 1.01 mg/dL). Recent Labs  Lab 03/12/23 2200 03/13/23 0045  WBC 11.6*  --   LATICACIDVEN  --  1.6    Liver Function Tests: Recent Labs  Lab 03/12/23 2200  AST 68*  ALT 61*  ALKPHOS 85  BILITOT 1.5*  PROT 9.1*  ALBUMIN 4.6   No results for input(s): "LIPASE", "AMYLASE" in the last 168 hours. No results for input(s): "AMMONIA" in the last 168 hours.  ABG    Component Value Date/Time   PHART 7.31 (L) 03/12/2023 2232   PCO2ART 49 (H) 03/12/2023 2232   PO2ART 272 (H) 03/12/2023 2232   HCO3 24.6 03/12/2023 2232   ACIDBASEDEF 2.4 (H) 03/12/2023 2232   O2SAT 100 03/12/2023 2232     Coagulation Profile: No results for input(s): "INR", "PROTIME" in the last 168 hours.  Cardiac Enzymes: No results for input(s): "CKTOTAL", "CKMB", "CKMBINDEX", "TROPONINI" in the last 168 hours.  HbA1C: No results found for: "HGBA1C"  CBG: Recent Labs  Lab 03/12/23 2155  GLUCAP 178*    Review of Systems:   Critically ill intubated on mechanical life support  Past Medical History:  He,  has no past medical history on file.   Surgical History:  EGD, colonoscopy  Social History:      Family History:  His family history is not on file.   Allergies No Known Allergies   Home Medications  Prior to Admission medications   Medication Sig Start Date End Date Taking? Authorizing Provider  lisinopril (ZESTRIL) 10 MG tablet Take 10 mg by mouth daily. 12/29/22  Yes [provider]  SYNTHROID 50 MCG tablet Take 50 mcg by mouth daily. 12/30/22  Yes [provider]     This patient is critically ill with multiple organ system failure; which, requires frequent high complexity decision making, assessment, support, evaluation, and titration of therapies. This was completed through the application of advanced monitoring technologies and extensive interpretation of multiple  databases. During this encounter critical care time was devoted to patient care services described in this note for 32 minutes.  [name], DO [facility] 03/13/2023 3:44 AM

## 2023-03-13 NOTE — Progress Notes (Signed)
eLink Physician-Brief Progress Note Patient Name: DELDRICK Newman DOB: 1946-04-10 MRN: [id number]   Date of Service  03/13/2023  HPI/Events of Note  76/M with history of upper esophageal stricture, who presents with shortness of breath, unable to clear respiratory secretions.  On evaluation in the ED, he was hypoxemic and was intubated. CXR showed bilateral pulmonary infiltrates.    eICU Interventions  Virtual assessment performed.  - Will continue ventilator support.  - Maintain on low tidal volume ventilation strategy, target plateau pressures <30 - Titrate FiO2 and PEEP to target SpO2 >90% - Will follow serial ABG, make further vent changes as appropriate.  - On precedex, fentanyl for sedation - Started on empiric antibiotics. Will follow cultures and adjust antibiotics as warranted.  - Not on pressor support at this time,  - Trend WBC, lactate, temperature curve.         Taylia Berber M DELA CRUZ 03/13/2023, 4:39 AM

## 2023-03-13 NOTE — Progress Notes (Addendum)
eLink Physician-Brief Progress Note Patient Name: Scott Newman DOB: 03/29/1946 MRN: [id number]   Date of Service  03/13/2023  HPI/Events of Note  Pt had suddenly developed bradycardia and lost pulses.  Chest compressions were immediately started with ROSC <2 minutes.  Following ROSC, HR 50s, with SBP 50s.    eICU Interventions  Sedation held.  Will give 1L crystalloid bolus now.  Started on norepinephrine drip. Will titrate to target MAP >65 If persistently hypotensive will plan to start vasopressin.  Following these interventions, SBP up to 96, HR 63 May need central line if levophed requirements continue to increase Pt also now awake and alert. Will plan to restart fentanyl once BP stable.   Will continue to monitor closely.         Etrulia Zarr M DELA CRUZ 03/13/2023, 5:41 AM

## 2023-03-13 NOTE — Progress Notes (Signed)
eLink Physician-Brief Progress Note Patient Name: RAMIEL PERNICIARO DOB: Aug 02, 1945 MRN: [id number]   Date of Service  03/13/2023  HPI/Events of Note  Troponin jumped up to 1,047.  Pt intubated, unable to indicate chest pain.    eICU Interventions  Will repeat EKG.  Pt already planned for echo this AM as well.  Will start on heparin infusion (no bolus) for possible NSTEMI.  Pt with some blood per NGT (traumatic insertion, pt with with esophageal strictures), and pink urine. Will continue to monitor for continued or worsening bleed while on anticoagulation.  Cardio consult this AM.         Alixandria Friedt M DELA CRUZ 03/13/2023, 6:21 AM

## 2023-03-13 NOTE — Progress Notes (Signed)
ANTICOAGULATION CONSULT NOTE   Pharmacy Consult for heparin Indication: chest pain/ACS  No Known Allergies  Patient Measurements: Height: 5\' 8"  (172.7 cm) Weight: 86.9 kg (191 lb 9.3 oz) IBW/kg (Calculated) : 68.4 Heparin Dosing Weight: 86 kg  Vital Signs: Temp: 96.8 F (36 C) (09/01 0415) Temp Source: Bladder (09/01 0415) BP: 164/77 (09/01 0415) Pulse Rate: 60 (09/01 0415)  Labs: Recent Labs    03/12/23 2200 03/13/23 0017 03/13/23 0304 03/13/23 0459  HGB 18.5*  --  14.6 14.2  HCT 53.5*  --  41.7 41.2  PLT 269  --  160 160  CREATININE 1.01  --  0.71 0.70  TROPONINIHS 82* 353*  --  1,047*    Estimated Creatinine Clearance: 81.6 mL/min (by C-G formula based on SCr of 0.7 mg/dL).   Assessment: Patient is a 77 y.o M with hx upper esophageal stricture  who presented to the ED on 03/12/23 with respiratory distress. Troponin is noted to be elevated. Pharmacy has been consulted to dose heparin drip for ACS.  Today, 03/13/2023: - troponin up 1047 - cbc ok - some blood noted in NGT. Suspects trauma from insertion and pink urine noted. Per Dr. Sondra Come via  msg, no bolus for heparin drip  Goal of Therapy:  Heparin level 0.3-0.7 units/ml Monitor platelets by anticoagulation protocol: Yes   Plan:  - heparin drip at 1200 units/hr - check 8 hr heparin level - monitor for severity of bleeding  Aziah Brostrom P 03/13/2023,6:28 AM

## 2023-03-14 ENCOUNTER — Encounter: Payer: Self-pay | Admitting: Internal Medicine

## 2023-03-14 DIAGNOSIS — J9601 Acute respiratory failure with hypoxia: Secondary | ICD-10-CM | POA: Diagnosis not present

## 2023-03-14 LAB — BLOOD CULTURE ID PANEL (REFLEXED) - BCID2

## 2023-03-14 LAB — COMPREHENSIVE METABOLIC PANEL
ALT: 40 U/L (ref 0–44)
AST: 51 U/L — ABNORMAL HIGH (ref 15–41)
Albumin: 3.2 g/dL — ABNORMAL LOW (ref 3.5–5.0)
Alkaline Phosphatase: 57 U/L (ref 38–126)
Anion gap: 15 (ref 5–15)
BUN: 9 mg/dL (ref 8–23)
CO2: 20 mmol/L — ABNORMAL LOW (ref 22–32)
Calcium: 8.4 mg/dL — ABNORMAL LOW (ref 8.9–10.3)
Chloride: 96 mmol/L — ABNORMAL LOW (ref 98–111)
Creatinine, Ser: 0.74 mg/dL (ref 0.61–1.24)
GFR, Estimated: >60 mL/min (ref >60)
Glucose, Bld: 117 mg/dL — ABNORMAL HIGH (ref 70–99)
Potassium: 3.9 mmol/L (ref 3.5–5.1)
Sodium: 131 mmol/L — ABNORMAL LOW (ref 135–145)
Total Bilirubin: 1.5 mg/dL — ABNORMAL HIGH (ref 0.3–1.2)
Total Protein: 6.3 g/dL — ABNORMAL LOW (ref 6.5–8.1)

## 2023-03-14 LAB — CBC
HCT: 46.4 % (ref 39.0–52.0)
Hemoglobin: 15.6 g/dL (ref 13.0–17.0)
MCH: 37.1 pg — ABNORMAL HIGH (ref 26.0–34.0)
MCHC: 33.6 g/dL (ref 30.0–36.0)
MCV: 110.5 fL — ABNORMAL HIGH (ref 80.0–100.0)
Platelets: 184 10*3/uL (ref 150–400)
RBC: 4.2 MIL/uL — ABNORMAL LOW (ref 4.22–5.81)
RDW: 13.3 % (ref 11.5–15.5)
WBC: 13.9 10*3/uL — ABNORMAL HIGH (ref 4.0–10.5)
nRBC: 0 % (ref 0.0–0.2)

## 2023-03-14 LAB — GLUCOSE, CAPILLARY
Glucose-Capillary: 138 mg/dL — ABNORMAL HIGH (ref 70–99)
Glucose-Capillary: 139 mg/dL — ABNORMAL HIGH (ref 70–99)
Glucose-Capillary: 147 mg/dL — ABNORMAL HIGH (ref 70–99)
Glucose-Capillary: 149 mg/dL — ABNORMAL HIGH (ref 70–99)
Glucose-Capillary: 167 mg/dL — ABNORMAL HIGH (ref 70–99)
Glucose-Capillary: 175 mg/dL — ABNORMAL HIGH (ref 70–99)

## 2023-03-14 LAB — HEPARIN LEVEL (UNFRACTIONATED)
Heparin Unfractionated: 0.19 [IU]/mL — ABNORMAL LOW (ref 0.30–0.70)
Heparin Unfractionated: 0.69 [IU]/mL (ref 0.30–0.70)

## 2023-03-14 LAB — PHOSPHORUS: Phosphorus: 3.5 mg/dL (ref 2.5–4.6)

## 2023-03-14 LAB — MAGNESIUM: Magnesium: 2.1 mg/dL (ref 1.7–2.4)

## 2023-03-14 MED ORDER — PROPOFOL 1000 MG/100ML IV EMUL
INTRAVENOUS | Status: AC
  Administered 2023-03-14: 20 ug/kg/min via INTRAVENOUS
  Filled 2023-03-14: qty 100

## 2023-03-14 MED ORDER — VITAL 1.5 CAL PO LIQD
1000.0000 mL | ORAL | Status: DC
  Administered 2023-03-14: 1000 mL
  Filled 2023-03-14 (×3): qty 1000

## 2023-03-14 MED ORDER — ACETAMINOPHEN 160 MG/5ML PO SOLN
650.0000 mg | Freq: Four times a day (QID) | ORAL | Status: DC | PRN
  Administered 2023-03-14 (×2): 650 mg
  Filled 2023-03-14 (×2): qty 20.3

## 2023-03-14 MED ORDER — PROPOFOL 1000 MG/100ML IV EMUL: 0.0000 ug/kg/min | INTRAVENOUS | Status: DC

## 2023-03-14 MED ORDER — PROSOURCE TF20 ENFIT COMPATIBL EN LIQD: 60.0000 mL | Freq: Every day | ENTERAL | Status: DC

## 2023-03-14 MED ORDER — PANTOPRAZOLE SODIUM 40 MG IV SOLR
40.0000 mg | INTRAVENOUS | Status: DC
  Administered 2023-03-14 – 2023-03-19 (×6): 40 mg via INTRAVENOUS
  Filled 2023-03-14 (×6): qty 10

## 2023-03-14 MED ORDER — VITAL HIGH PROTEIN PO LIQD: 1000.0000 mL | ORAL | Status: DC

## 2023-03-14 MED ORDER — INSULIN ASPART 100 UNIT/ML IJ SOLN
0.0000 [IU] | INTRAMUSCULAR | Status: DC
  Administered 2023-03-14: 2 [IU] via SUBCUTANEOUS
  Administered 2023-03-14: 3 [IU] via SUBCUTANEOUS
  Administered 2023-03-14 (×2): 2 [IU] via SUBCUTANEOUS
  Administered 2023-03-15 (×3): 3 [IU] via SUBCUTANEOUS
  Administered 2023-03-15: 2 [IU] via SUBCUTANEOUS
  Administered 2023-03-15: 3 [IU] via SUBCUTANEOUS
  Administered 2023-03-15: 2 [IU] via SUBCUTANEOUS
  Administered 2023-03-16 (×2): 3 [IU] via SUBCUTANEOUS
  Administered 2023-03-16: 2 [IU] via SUBCUTANEOUS
  Administered 2023-03-16: 3 [IU] via SUBCUTANEOUS
  Administered 2023-03-16 – 2023-03-18 (×8): 2 [IU] via SUBCUTANEOUS
  Administered 2023-03-18 (×3): 3 [IU] via SUBCUTANEOUS
  Administered 2023-03-18 – 2023-03-19 (×4): 2 [IU] via SUBCUTANEOUS

## 2023-03-14 MED ORDER — LEVOTHYROXINE SODIUM 50 MCG PO TABS
50.0000 ug | ORAL_TABLET | Freq: Every day | ORAL | Status: DC
  Administered 2023-03-14 – 2023-03-18 (×3): 50 ug
  Filled 2023-03-14 (×4): qty 1

## 2023-03-14 MED ORDER — PROSOURCE TF20 ENFIT COMPATIBL EN LIQD
60.0000 mL | Freq: Two times a day (BID) | ENTERAL | Status: DC
  Administered 2023-03-14 – 2023-03-15 (×3): 60 mL
  Filled 2023-03-14 (×3): qty 60

## 2023-03-14 NOTE — Progress Notes (Addendum)
NAME:  Scott Newman, MRN:  [id number], DOB:  [birth year], LOS: 1 ADMISSION DATE:  03/12/2023, CONSULTATION DATE:  03/14/2023  REFERRING MD:  Dr Clarice Pole, CHIEF COMPLAINT: Respiratory failure on mechanical support  History of Present Illness:  77 yo male former smoker with hx of esophageal stricture who was scheduled for EGD with [name].  He was advised to have this done in the hospital.  He eats saltine crackers daily to keep his sodium level up.  He felt choked in the evening of 8/31 while eating crackers.  He was brought to ER and SpO2 in 70's, and chest xray showed bilateral infiltrates.  He required intubation in ER.  PCCM consulted to manage in ICU.  He has two MRN's.  Pertinent  Medical History  Esophageal stricture, Hyponatremia, IDA, Aortic stenosis, Cataract, HFpEF, Colon polyp, Depression, Gait disturbance, GERD, GI bleeding, ETOH, HTN, HLD, Hypothyroidism, Prostate cancer, Salmonella  Significant Hospital Events: Including procedures, antibiotic start and stop dates in addition to other pertinent events   8/31 Admit, intubated, start ABx 9/01 elevated troponin >> start heparin and levophed gtt  Interim History / Subjective:  Remains on pressors, sedation.  Agitated with WUA.    Objective   Blood pressure (!) 122/41, pulse (!) 57, temperature 98.2 F (36.8 C), temperature source Axillary, resp. rate 16, height 5\' 8"  (1.727 m), weight 90.9 kg, SpO2 99%.    Vent Mode: PRVC FiO2 (%):  [40 %] 40 % Set Rate:  [16 bmp] 16 bmp Vt Set:  [540 mL] 540 mL PEEP:  [5 cmH20] 5 cmH20 Plateau Pressure:  [17 cmH20-19 cmH20] 17 cmH20   Intake/Output Summary (Last 24 hours) at 03/14/2023 0810 Last data filed at 03/14/2023 0600 Gross per 24 hour  Intake 2206.52 ml  Output 1950 ml  Net 256.52 ml   Filed Weights   03/12/23 2309 03/13/23 0415 03/14/23 0500  Weight: 86.2 kg 86.9 kg 90.9 kg    Examination:  General - sedated Eyes - pupils reactive ENT - no sinus tenderness, no  stridor Cardiac - regular rate/rhythm, 3/6 murmur Chest - scattered rhonchi Abdomen - soft, non tender, + bowel sounds Extremities - no cyanosis, clubbing, or edema Skin - no rashes Neuro - RASS -2  Resolved Hospital Problem list     Assessment & Plan:   Acute hypoxic respiratory failure after choking on crackers. - likely from aspiration pneumonitis and acute pulmonary edema  - goal SpO2 > 92% - follow up CXR  Septic shock from aspiration pneumonitis. - day 3 of ABx - wean pressors to keep MAP > 65  Severe aortic stenosis. NTSTEMI with elevated troponin likely from demand ischemia. Chronic HFpEF. - Echo 9/01 >> EF 50 to 55%, mild LVH, RVSP 41.4 mmHg, severe LA dilation, mild MR, severe AS - continue heparin gtt for now - will need cardiology assessment when respiratory status more stable  Esophageal stricture. - followed by [facility] - will need EGD when medically stable  Chronic hyponatremia. - baseline Na 127 to 129 - follow up BMET  Hx of hypothyroidism. - continue synthroid  Hyperglycemia. - SSI - check HbA1C  Acute metabolic encephalopathy from sepsis. - RASS goal 0 to -1  Staph in blood culture. - likely contaminate  Best Practice (right click and "Reselect all SmartList Selections" daily)   Diet/type: tubefeeds DVT prophylaxis: systemic heparin GI prophylaxis: PPI Lines: N/A Foley:  Yes, and it is still needed Code Status:  full code Last date of multidisciplinary goals of care  discussion [x]   Labs       Latest Ref Rng & Units 03/13/2023    4:59 AM 03/13/2023    3:04 AM 03/12/2023   10:00 PM  CMP  Glucose 70 - 99 mg/dL 295   621   BUN 8 - 23 mg/dL 8   6   Creatinine 3.08 - 1.24 mg/dL 6.57  8.46  9.62   Sodium 135 - 145 mmol/L 128   129   Potassium 3.5 - 5.1 mmol/L 4.4   3.5   Chloride 98 - 111 mmol/L 97   93   CO2 22 - 32 mmol/L 22   24   Calcium 8.9 - 10.3 mg/dL 8.8   9.4   Total Protein 6.5 - 8.1 g/dL   9.1   Total Bilirubin 0.3 - 1.2  mg/dL   1.5   Alkaline Phos 38 - 126 U/L   85   AST 15 - 41 U/L   68   ALT 0 - 44 U/L   61       Latest Ref Rng & Units 03/13/2023   12:22 PM 03/13/2023    4:59 AM 03/13/2023    3:04 AM  CBC  WBC 4.0 - 10.5 K/uL  9.3  10.6   Hemoglobin 13.0 - 17.0 g/dL  95.2  84.1   Hematocrit 39.0 - 52.0 %  41.2  41.7   Platelets 150 - 400 K/uL 159  160  160    ABG    Component Value Date/Time   PHART 7.45 03/13/2023 0305   PCO2ART 36 03/13/2023 0305   PO2ART 102 03/13/2023 0305   HCO3 25.2 03/13/2023 0305   ACIDBASEDEF 2.4 (H) 03/12/2023 2232   O2SAT 99.4 03/13/2023 0305    CBG (last 3)  Recent Labs    03/13/23 1933 03/13/23 2334 03/14/23 0342  GLUCAP 154* 161* 167*    Critical care time: 36 minutes  Coralyn Helling, MD [place] [facility]/Critical [facility] Pager - [phone] or [phone] 03/14/2023, 8:32 AM

## 2023-03-14 NOTE — TOC Initial Note (Signed)
Transition of Care [facility]) - Initial/Assessment Note    Patient Details  Name: Scott Newman MRN: [id number] Date of Birth: 1945-07-17  Transition of Care [facility]) CM/SW Contact:    Howell Rucks, RN Phone Number: 03/14/2023, 9:10 AM  Clinical Narrative:   [facility] consult for HH/DME needs, pt in ICU sedated on vent. TOC will continue to follow.                      Patient Goals and CMS Choice            Expected Discharge Plan and Services                                              Prior Living Arrangements/Services                       Activities of Daily Living Home Assistive Devices/Equipment: None ADL Screening (condition at time of admission) Patient's cognitive ability adequate to safely complete daily activities?: Yes Is the patient deaf or have difficulty hearing?: No Does the patient have difficulty seeing, even when wearing glasses/contacts?: No Does the patient have difficulty concentrating, remembering, or making decisions?: No Patient able to express need for assistance with ADLs?: Yes Does the patient have difficulty dressing or bathing?: Yes Independently performs ADLs?: Yes (appropriate for developmental age) Does the patient have difficulty walking or climbing stairs?: No Weakness of Legs: None Weakness of Arms/Hands: None  Permission Sought/Granted                  Emotional Assessment              Admission diagnosis:  Acute respiratory failure with hypoxia (HCC) [J96.01] Aspiration into airway, initial encounter [T17.908A] Acute hypoxemic respiratory failure (HCC) [J96.01] Patient Active Problem List   Diagnosis Date Noted   Acute hypoxemic respiratory failure (HCC) 03/13/2023   PCP:  Alberteen Sam, MD Pharmacy:   [facility] [phone] - [place], [place] - [address] AT [address] OF [facility] [address] [place] [address] Phone: [phone] Fax:  [phone]     Social Determinants of Health (SDOH) Social History: SDOH Screenings   Food Insecurity: Patient Unable To Answer (03/13/2023)  Housing: Patient Unable To Answer (03/13/2023)  Transportation Needs: Patient Unable To Answer (03/13/2023)  Utilities: Patient Unable To Answer (03/13/2023)   SDOH Interventions:     Readmission Risk Interventions     No data to display

## 2023-03-14 NOTE — Progress Notes (Addendum)
eLink Physician-Brief Progress Note Patient Name: AHMARE MORALAS DOB: 04/18/1946 MRN: [id number]   Date of Service  03/14/2023  HPI/Events of Note  T 100.9 with ice packs. On Unasyn Request for PRN tylenol LFTs fair AST 68 and ALT 61  eICU Interventions  PRN tyelnol ordered Monitor LFTs     Intervention Category Intermediate Interventions: Infection - evaluation and management  Marina Desire Mechele Collin 03/14/2023, 2:23 AM

## 2023-03-14 NOTE — Progress Notes (Signed)
PHARMACY - PHYSICIAN COMMUNICATION CRITICAL VALUE ALERT - BLOOD CULTURE IDENTIFICATION (BCID)  Scott Newman is an 77 y.o. male who presented to [facility] on 03/12/2023 with a chief complaint of respiratory distress. He was started on unasyn on adm foor aspiration PNA. One of four blood culture collected on 03/13/23 resumed back with [facility] (BCID= staph species.  Name of physician (or Provider) Contacted: Dr. Craige Cotta  Current antibiotics: unasyn 3gm q6h  Changes to prescribed antibiotics recommended:  - suspect staph species is a contaminant. Cont unasyn for now  Results for orders placed or performed during the hospital encounter of 03/12/23  Blood Culture ID Panel (Reflexed) (Collected: 03/13/2023  1:25 AM)  Result Value Ref Range   Enterococcus faecalis NOT DETECTED NOT DETECTED   Enterococcus Faecium NOT DETECTED NOT DETECTED   Listeria monocytogenes NOT DETECTED NOT DETECTED   Staphylococcus species DETECTED (A) NOT DETECTED   Staphylococcus aureus (BCID) NOT DETECTED NOT DETECTED   Staphylococcus epidermidis NOT DETECTED NOT DETECTED   Staphylococcus lugdunensis NOT DETECTED NOT DETECTED   Streptococcus species NOT DETECTED NOT DETECTED   Streptococcus agalactiae NOT DETECTED NOT DETECTED   Streptococcus pneumoniae NOT DETECTED NOT DETECTED   Streptococcus pyogenes NOT DETECTED NOT DETECTED   A.calcoaceticus-baumannii NOT DETECTED NOT DETECTED   Bacteroides fragilis NOT DETECTED NOT DETECTED   Enterobacterales NOT DETECTED NOT DETECTED   Enterobacter cloacae complex NOT DETECTED NOT DETECTED   Escherichia coli NOT DETECTED NOT DETECTED   Klebsiella aerogenes NOT DETECTED NOT DETECTED   Klebsiella oxytoca NOT DETECTED NOT DETECTED   Klebsiella pneumoniae NOT DETECTED NOT DETECTED   Proteus species NOT DETECTED NOT DETECTED   Salmonella species NOT DETECTED NOT DETECTED   Serratia marcescens NOT DETECTED NOT DETECTED   Haemophilus influenzae NOT DETECTED NOT DETECTED   Neisseria  meningitidis NOT DETECTED NOT DETECTED   Pseudomonas aeruginosa NOT DETECTED NOT DETECTED   Stenotrophomonas maltophilia NOT DETECTED NOT DETECTED   Candida albicans NOT DETECTED NOT DETECTED   Candida auris NOT DETECTED NOT DETECTED   Candida glabrata NOT DETECTED NOT DETECTED   Candida krusei NOT DETECTED NOT DETECTED   Candida parapsilosis NOT DETECTED NOT DETECTED   Candida tropicalis NOT DETECTED NOT DETECTED   Cryptococcus neoformans/gattii NOT DETECTED NOT DETECTED    Lucia Gaskins 03/14/2023  7:51 AM

## 2023-03-14 NOTE — Progress Notes (Signed)
ANTICOAGULATION CONSULT NOTE   Pharmacy Consult for heparin Indication: chest pain/ACS  No Known Allergies  Patient Measurements: Height: 5\' 8"  (172.7 cm) Weight: 86.9 kg (191 lb 9.3 oz) IBW/kg (Calculated) : 68.4 Heparin Dosing Weight: 86 kg  Vital Signs: Temp: 100.6 F (38.1 C) (09/01 2335) Temp Source: Bladder (09/01 2015) BP: 110/43 (09/01 2335) Pulse Rate: 60 (09/01 2335)  Labs: Recent Labs    03/12/23 2200 03/13/23 0017 03/13/23 0304 03/13/23 0459 03/13/23 0935 03/13/23 1222 03/13/23 1437 03/13/23 1652 03/13/23 2315  HGB 18.5*  --  14.6 14.2  --   --   --   --   --   HCT 53.5*  --  41.7 41.2  --   --   --   --   --   PLT 269  --  160 160  --  159  --   --   --   APTT  --   --   --   --   --  66*  --   --   --   LABPROT  --   --   --   --   --  15.0  --   --   --   INR  --   --   --   --   --  1.2  --   --   --   HEPARINUNFRC  --   --   --   --   --   --  0.21*  --  0.42  CREATININE 1.01  --  0.71 0.70  --   --   --   --   --   TROPONINIHS 82*   < >  --  1,047* 1,364*  --   --  1,692*  --    < > = values in this interval not displayed.    Estimated Creatinine Clearance: 81.6 mL/min (by C-G formula based on SCr of 0.7 mg/dL).   Assessment: Patient is a 77 y.o M with hx upper esophageal stricture  who presented to the ED on 03/12/23 with respiratory distress. Troponin is noted to be elevated. Pharmacy has been consulted to dose heparin drip for ACS.  Today, 03/14/2023: -Heparin level 0.21 - subtherapeutic on heparin infusion at 1200 units/hr -CBC ok -Some blood noted in NGT following intubation this morning. Suspected trauma related from insertion and pink urine noted. -Per report from RN, bleeding from gums when brushing teeth and blood noted in foley. -Discussed with CCMD - given increase in troponin, would like to continue heparin but monitor for worsening bleeding and need to stop heparin  PM Follow up - Heparin level 0.42 (therapeutic) with heparin gtt @  1350 units/hr - RN reports continued bleeding from gums as noted earlier  Goal of Therapy:  Heparin level 0.3-0.7 units/ml Monitor platelets by anticoagulation protocol: Yes   Plan:  -IContinue heparin infusion @ 1350 units/hr  -Check heparin level with AM labs to confirm therapeutic dose -Daily CBC & heparin level -Monitor for new or worsening bleeding  Maryellen Pile, PharmD 03/14/2023 12:02 AM

## 2023-03-14 NOTE — Progress Notes (Signed)
He became acutely agitated and combative.  Had been on precedex infusion while on vent.  Transiently developed narrow complex tachycardia.  Changed sedation to propofol with improved sedation.  Returned to sinus rhythm.  Updated his wife at bedside.  Coralyn Helling, MD [facility] Pulmonary/Critical Care Pager - [phone] or [phone] 03/14/2023, 6:23 PM

## 2023-03-14 NOTE — Progress Notes (Signed)
Initial Nutrition Assessment  DOCUMENTATION CODES:   Not applicable  INTERVENTION:  - Per CCM plan to start tube feeds today: Vital 1.5 at 50 ml/h (1200 ml per day) *Start at 41mL/hr and advance by 10mL Q8H Prosource TF20 60 ml BID Provides 1960 kcal, 121 gm protein, 917 ml free water daily  - Monitor magnesium, potassium, and phosphorus BID for at least 3 days, MD to replete as needed, as pt is at risk for refeeding syndrome.  - FWF per CCM/MD.   - Monitor weight trends.   NUTRITION DIAGNOSIS:   Inadequate oral intake related to inability to eat as evidenced by NPO status.  GOAL:   Patient will meet greater than or equal to 90% of their needs  MONITOR:   Vent status, Labs, Weight trends, TF tolerance  REASON FOR ASSESSMENT:   Consult Enteral/tube feeding initiation and management  ASSESSMENT:   77 year old male with PMH of upper esophageal stricture, depression, HTN, HLD who presented after choking on crackers. Admitted with acute hypoxic respiratory failure from aspiration pneumonitis.   9/1 Admit, Intubated  Patient intubated and sedated at time of visit, no family at bedside.   Per chart review no previous weight history PTA. Weight has ranged from 190# to 200#. Suspect current weight elevated d/t fluid (+1.6L).  OGT xray verified in stomach. Per CCM MD, plan to start tube feeds today.  Discussed formula and advancements with RN.    Medications reviewed and include: Colace, Miralax Precedex Fentanyl Levophed  Labs reviewed:  Na 131   NUTRITION - FOCUSED PHYSICAL EXAM:  Flowsheet Row Most Recent Value  Orbital Region Mild depletion  Upper Arm Region No depletion  Thoracic and Lumbar Region No depletion  Buccal Region Unable to assess  Temple Region Mild depletion  Clavicle Bone Region No depletion  Clavicle and Acromion Bone Region No depletion  Scapular Bone Region Unable to assess  Dorsal Hand No depletion  Patellar Region No depletion   Anterior Thigh Region No depletion  Posterior Calf Region No depletion  Edema (RD Assessment) None  Hair Reviewed  Eyes Unable to assess  Mouth Unable to assess  Skin Reviewed  Nails Reviewed       Diet Order:   Diet Order             Diet NPO time specified  Diet effective now                   EDUCATION NEEDS:  Not appropriate for education at this time  Skin:  Skin Assessment: Reviewed RN Assessment  Last BM:  unknown  Height:  Ht Readings from Last 1 Encounters:  03/13/23 5\' 8"  (1.727 m)   Weight:  Wt Readings from Last 1 Encounters:  03/14/23 90.9 kg   Ideal Body Weight:  70 kg  BMI:  Body mass index is 30.47 kg/m.  Estimated Nutritional Needs:  Kcal:  1900-2150 kcals Protein:  105-130 grams Fluid:  >/= 1.9L    Shelle Iron RD, LDN For contact information, refer to [facility].

## 2023-03-14 NOTE — Progress Notes (Signed)
eLink Physician-Brief Progress Note Patient Name: Scott Newman DOB: 01-23-1946 MRN: [id number]   Date of Service  03/14/2023  HPI/Events of Note  Patient on vent  eICU Interventions  Restraints renewed     Intervention Category Minor Interventions: Agitation / anxiety - evaluation and management  Daphne Karrer Mechele Collin 03/14/2023, 6:11 AM

## 2023-03-14 NOTE — Progress Notes (Signed)
Brief Pharmacy Note:  See note from Adalberto Cole, PharmD, earlier today for full details. Briefly, this is a 45 y/oM on IV heparin for ACS.  2037 heparin level = 0.69 units/mL, therapeutic after rate increase earlier today Per discussion with RN, no active bleeding noted at this time. On her assessment, there was old crusted blood in his oral mucosa and lips and minimal bleeding when suctioning. CBC: Hgb, Plt WNL  Plan: Continue IV heparin infusion at 1500 units/hr Heparin level in 8 hours to ensure remains within therapeutic range Daily CBC, heparin level Monitor closely for worsening/new bleeding  Greer Pickerel, PharmD, BCPS Clinical Pharmacist 03/14/2023 9:44 PM

## 2023-03-14 NOTE — Progress Notes (Signed)
ANTICOAGULATION CONSULT NOTE   Pharmacy Consult for heparin Indication: chest pain/ACS  No Known Allergies  Patient Measurements: Height: 5\' 8"  (172.7 cm) Weight: 90.9 kg (200 lb 6.4 oz) IBW/kg (Calculated) : 68.4 Heparin Dosing Weight: 86 kg  Vital Signs: Temp: 100.8 F (38.2 C) (09/02 0800) Temp Source: Bladder (09/02 0800) BP: 121/45 (09/02 1100) Pulse Rate: 58 (09/02 1115)  Labs: Recent Labs    03/13/23 0304 03/13/23 0459 03/13/23 0935 03/13/23 1222 03/13/23 1437 03/13/23 1652 03/13/23 2315 03/14/23 1037  HGB 14.6 14.2  --   --   --   --   --  15.6  HCT 41.7 41.2  --   --   --   --   --  46.4  PLT 160 160  --  159  --   --   --  184  APTT  --   --   --  66*  --   --   --   --   LABPROT  --   --   --  15.0  --   --   --   --   INR  --   --   --  1.2  --   --   --   --   HEPARINUNFRC  --   --   --   --  0.21*  --  0.42 0.19*  CREATININE 0.71 0.70  --   --   --   --   --  0.74  TROPONINIHS  --  1,047* 1,364*  --   --  1,692*  --   --     Estimated Creatinine Clearance: 83.3 mL/min (by C-G formula based on SCr of 0.74 mg/dL).   Assessment: Patient is a 77 y.o M with hx upper esophageal stricture  who presented to the ED on 03/12/23 with respiratory distress. Troponin is noted to be elevated. Pharmacy has been consulted to dose heparin drip for ACS.  Today, 03/14/2023: -Heparin level 0.21 - subtherapeutic on heparin infusion at 1350 units/hr -CBC ok -Per report from RN, bleeding from gums when brushing teeth and blood noted in foley. Consistent with previous amounts, no increase in bleeding  -9/1 Discussed with CCMD - given increase in troponin, would like to continue heparin but monitor for worsening bleeding and need to stop heparin   Goal of Therapy:  Heparin level 0.3-0.7 units/ml Monitor platelets by anticoagulation protocol: Yes   Plan:  -Increase heparin infusion to 1500 units/hr  -Check heparin level 8 hours  after rate change -Daily CBC & heparin  level -Monitor for new or worsening bleeding   Adalberto Cole, PharmD, BCPS 03/14/2023 12:29 PM

## 2023-03-15 ENCOUNTER — Inpatient Hospital Stay (HOSPITAL_COMMUNITY): Payer: PPO

## 2023-03-15 ENCOUNTER — Other Ambulatory Visit: Payer: Self-pay

## 2023-03-15 DIAGNOSIS — J9601 Acute respiratory failure with hypoxia: Secondary | ICD-10-CM | POA: Diagnosis not present

## 2023-03-15 LAB — HEMOGLOBIN AND HEMATOCRIT, BLOOD
HCT: 42.8 % (ref 39.0–52.0)
HCT: 42.8 % (ref 39.0–52.0)
HCT: 46.7 % (ref 39.0–52.0)
Hemoglobin: 14.7 g/dL (ref 13.0–17.0)
Hemoglobin: 15.1 g/dL (ref 13.0–17.0)
Hemoglobin: 16 g/dL (ref 13.0–17.0)

## 2023-03-15 LAB — HEMOGLOBIN A1C
Hgb A1c MFr Bld: 5.4 % (ref 4.8–5.6)
Mean Plasma Glucose: 108.28 mg/dL

## 2023-03-15 LAB — BASIC METABOLIC PANEL
Anion gap: 6 (ref 5–15)
BUN: 13 mg/dL (ref 8–23)
CO2: 22 mmol/L (ref 22–32)
Calcium: 8.3 mg/dL — ABNORMAL LOW (ref 8.9–10.3)
Chloride: 102 mmol/L (ref 98–111)
Creatinine, Ser: 0.7 mg/dL (ref 0.61–1.24)
GFR, Estimated: >60 mL/min (ref >60)
Glucose, Bld: 135 mg/dL — ABNORMAL HIGH (ref 70–99)
Potassium: 3.8 mmol/L (ref 3.5–5.1)
Sodium: 130 mmol/L — ABNORMAL LOW (ref 135–145)

## 2023-03-15 LAB — CBC
HCT: 45.4 % (ref 39.0–52.0)
Hemoglobin: 15.5 g/dL (ref 13.0–17.0)
MCH: 37.1 pg — ABNORMAL HIGH (ref 26.0–34.0)
MCHC: 34.1 g/dL (ref 30.0–36.0)
MCV: 108.6 fL — ABNORMAL HIGH (ref 80.0–100.0)
Platelets: 145 10*3/uL — ABNORMAL LOW (ref 150–400)
RBC: 4.18 MIL/uL — ABNORMAL LOW (ref 4.22–5.81)
RDW: 12.9 % (ref 11.5–15.5)
WBC: 7.3 10*3/uL (ref 4.0–10.5)
nRBC: 0 % (ref 0.0–0.2)

## 2023-03-15 LAB — GLUCOSE, CAPILLARY
Glucose-Capillary: 148 mg/dL — ABNORMAL HIGH (ref 70–99)
Glucose-Capillary: 151 mg/dL — ABNORMAL HIGH (ref 70–99)
Glucose-Capillary: 152 mg/dL — ABNORMAL HIGH (ref 70–99)
Glucose-Capillary: 164 mg/dL — ABNORMAL HIGH (ref 70–99)
Glucose-Capillary: 172 mg/dL — ABNORMAL HIGH (ref 70–99)
Glucose-Capillary: 185 mg/dL — ABNORMAL HIGH (ref 70–99)

## 2023-03-15 LAB — MAGNESIUM
Magnesium: 2.1 mg/dL (ref 1.7–2.4)
Magnesium: 2.7 mg/dL — ABNORMAL HIGH (ref 1.7–2.4)

## 2023-03-15 LAB — TRIGLYCERIDES: Triglycerides: 136 mg/dL (ref <150)

## 2023-03-15 LAB — PHOSPHORUS
Phosphorus: 3.3 mg/dL (ref 2.5–4.6)
Phosphorus: 2.9 mg/dL (ref 2.5–4.6)

## 2023-03-15 MED ORDER — MAGNESIUM SULFATE 2 GM/50ML IV SOLN
2.0000 g | Freq: Once | INTRAVENOUS | Status: AC
  Administered 2023-03-15: 2 g via INTRAVENOUS
  Filled 2023-03-15: qty 50

## 2023-03-15 MED ORDER — SODIUM CHLORIDE 0.9% FLUSH
10.0000 mL | Freq: Two times a day (BID) | INTRAVENOUS | Status: DC
  Administered 2023-03-15 – 2023-03-16 (×3): 10 mL
  Administered 2023-03-17: 30 mL
  Administered 2023-03-17: 10 mL
  Administered 2023-03-18: 20 mL

## 2023-03-15 MED ORDER — ADENOSINE 6 MG/2ML IV SOLN: 6.0000 mg | Freq: Once | INTRAVENOUS | Status: AC

## 2023-03-15 MED ORDER — ORAL CARE MOUTH RINSE
15.0000 mL | Freq: Four times a day (QID) | OROMUCOSAL | Status: DC
  Administered 2023-03-15 (×2): 15 mL via OROMUCOSAL

## 2023-03-15 MED ORDER — SODIUM CHLORIDE 0.9% FLUSH: 10.0000 mL | INTRAVENOUS | Status: DC | PRN

## 2023-03-15 MED ORDER — HALOPERIDOL LACTATE 5 MG/ML IJ SOLN
INTRAMUSCULAR | Status: AC
  Filled 2023-03-15: qty 1

## 2023-03-15 MED ORDER — ORAL CARE MOUTH RINSE: 15.0000 mL | OROMUCOSAL | Status: DC | PRN

## 2023-03-15 MED ORDER — AMIODARONE HCL IN DEXTROSE 360-4.14 MG/200ML-% IV SOLN: 60.0000 mg/h | INTRAVENOUS | Status: AC

## 2023-03-15 MED ORDER — FUROSEMIDE 10 MG/ML IJ SOLN
40.0000 mg | Freq: Once | INTRAMUSCULAR | Status: AC
  Administered 2023-03-15: 40 mg via INTRAVENOUS
  Filled 2023-03-15: qty 4

## 2023-03-15 MED ORDER — AMIODARONE HCL IN DEXTROSE 360-4.14 MG/200ML-% IV SOLN
30.0000 mg/h | INTRAVENOUS | Status: DC
  Administered 2023-03-15 – 2023-03-18 (×6): 30 mg/h via INTRAVENOUS
  Filled 2023-03-15 (×6): qty 200

## 2023-03-15 MED ORDER — HALOPERIDOL LACTATE 5 MG/ML IJ SOLN
2.0000 mg | Freq: Four times a day (QID) | INTRAMUSCULAR | Status: DC | PRN
  Administered 2023-03-15 – 2023-03-17 (×6): 2 mg via INTRAVENOUS
  Filled 2023-03-15 (×5): qty 1

## 2023-03-15 MED ORDER — AMIODARONE HCL IN DEXTROSE 360-4.14 MG/200ML-% IV SOLN
INTRAVENOUS | Status: AC
  Administered 2023-03-15: 60 mg/h via INTRAVENOUS
  Filled 2023-03-15: qty 200

## 2023-03-15 MED ORDER — CLEVIDIPINE BUTYRATE 0.5 MG/ML IV EMUL
0.0000 mg/h | INTRAVENOUS | Status: DC
  Administered 2023-03-15: 2 mg/h via INTRAVENOUS
  Filled 2023-03-15: qty 50

## 2023-03-15 MED ORDER — CHLORHEXIDINE GLUCONATE CLOTH 2 % EX PADS
6.0000 | MEDICATED_PAD | Freq: Every day | CUTANEOUS | Status: DC
  Administered 2023-03-16 – 2023-03-19 (×4): 6 via TOPICAL

## 2023-03-15 MED ORDER — AMIODARONE LOAD VIA INFUSION
150.0000 mg | Freq: Once | INTRAVENOUS | Status: AC
  Administered 2023-03-15: 150 mg via INTRAVENOUS
  Filled 2023-03-15: qty 83.34

## 2023-03-15 MED ORDER — ORAL CARE MOUTH RINSE: 15.0000 mL | OROMUCOSAL | Status: DC

## 2023-03-15 MED ORDER — LABETALOL HCL 5 MG/ML IV SOLN
10.0000 mg | INTRAVENOUS | Status: DC | PRN
  Administered 2023-03-16 – 2023-03-18 (×6): 10 mg via INTRAVENOUS
  Filled 2023-03-15 (×7): qty 4

## 2023-03-15 MED ORDER — ADENOSINE 6 MG/2ML IV SOLN
INTRAVENOUS | Status: AC
  Administered 2023-03-15: 6 mg via INTRAVENOUS
  Filled 2023-03-15: qty 4

## 2023-03-15 MED ORDER — OLANZAPINE 10 MG IM SOLR
5.0000 mg | Freq: Once | INTRAMUSCULAR | Status: DC
  Filled 2023-03-15: qty 10

## 2023-03-15 MED ORDER — POTASSIUM CHLORIDE 20 MEQ PO PACK: 40.0000 meq | PACK | Freq: Once | ORAL | Status: DC

## 2023-03-15 MED ORDER — AMIODARONE IV BOLUS ONLY 150 MG/100ML
INTRAVENOUS | Status: AC
  Filled 2023-03-15: qty 100

## 2023-03-15 NOTE — Progress Notes (Signed)
Peripherally Inserted Central Catheter Placement  The IV Nurse has discussed with the patient and/or persons authorized to consent for the patient, the purpose of this procedure and the potential benefits and risks involved with this procedure.  The benefits include less needle sticks, lab draws from the catheter, and the patient may be discharged home with the catheter. Risks include, but not limited to, infection, bleeding, blood clot (thrombus formation), and puncture of an artery; nerve damage and irregular heartbeat and possibility to perform a PICC exchange if needed/ordered by physician.  Alternatives to this procedure were also discussed.  Bard Power PICC patient education guide, fact sheet on infection prevention and patient information card has been provided to patient /or left at bedside.    PICC Placement Documentation  PICC Double Lumen 03/15/23 Right Basilic 38 cm 0 cm (Active)  Indication for Insertion or Continuance of Line Vasoactive infusions 03/15/23 1846  Exposed Catheter (cm) 0 cm 03/15/23 1846  Site Assessment Clean, Dry, Intact 03/15/23 1846  Lumen #1 Status Flushed;Saline locked;Blood return noted 03/15/23 1846  Lumen #2 Status Flushed;Saline locked;Blood return noted 03/15/23 1846  Dressing Type Transparent;Securing device 03/15/23 1846  Dressing Status Antimicrobial disc in place;Clean, Dry, Intact 03/15/23 1846  Line Care Connections checked and tightened 03/15/23 1846  Line Adjustment (NICU/IV Team Only) No 03/15/23 1846  Dressing Intervention New dressing;Adhesive placed at insertion site (IV team only);Other (Comment) 03/15/23 1846  Dressing Change Due 03/22/23 03/15/23 1846    Patieent's spouse, Brantson Dulac, signed PICC consent via telephone. Verified by 2 PICC RNs.   Annett Fabian 03/15/2023, 6:48 PM

## 2023-03-15 NOTE — Progress Notes (Signed)
At 2000 patient's HR dropped to 48. Propofol gtt stopped then at 2050, patient was agitated with HR of 110s restarted the propofol. Patient's HR dropped again (46-48 bpm) after 15 minutes of infusion. Reported to E-link. E- link instructed to turn off propofol gtt and restart precedex. Propofol stopped, and precedex restarted.   Lance Coon 03/15/2023 6:34 AM

## 2023-03-15 NOTE — Progress Notes (Signed)
Pt extubated, following commands appropriately and A&O X 4. No longer needing restraints. Order discontinued. Will continue to monitor.

## 2023-03-15 NOTE — Progress Notes (Signed)
eLink Physician-Brief Progress Note Patient Name: Scott Newman DOB: [birth year] MRN: [id number]   Date of Service  03/15/2023  HPI/Events of Note  76 yo male former smoker with hx of esophageal stricture who came in with Aspiration pneumonia and respiratory failure that developed NSTEMI and was placed on a heparin GTT. Developed new hematuria with clots.    eICU Interventions  Heparin drip held per pharmacy, agree with holding and will trend H/H     Intervention Category Intermediate Interventions: Bleeding - evaluation and treatment with blood products  Acasia Skilton 03/15/2023, 1:21 AM

## 2023-03-15 NOTE — Progress Notes (Signed)
ANTICOAGULATION CONSULT NOTE   Pharmacy Consult for heparin Indication: chest pain/ACS  No Known Allergies  Patient Measurements: Height: 5\' 8"  (172.7 cm) Weight: 89.6 kg (197 lb 8.5 oz) IBW/kg (Calculated) : 68.4 Heparin Dosing Weight: 86 kg  Vital Signs: Temp: 99 F (37.2 C) (09/03 1200) Temp Source: Bladder (09/03 0730) BP: 147/86 (09/03 1200) Pulse Rate: 80 (09/03 1200)  Labs: Recent Labs    03/13/23 0459 03/13/23 0935 03/13/23 1222 03/13/23 1437 03/13/23 1652 03/13/23 2315 03/14/23 1037 03/14/23 2037 03/15/23 0155 03/15/23 0537 03/15/23 0710  HGB 14.2  --   --   --   --   --  15.6  --  14.7  --  15.5  HCT 41.2  --   --   --   --   --  46.4  --  42.8  --  45.4  PLT 160  --  159  --   --   --  184  --   --   --  145*  APTT  --   --  66*  --   --   --   --   --   --   --   --   LABPROT  --   --  15.0  --   --   --   --   --   --   --   --   INR  --   --  1.2  --   --   --   --   --   --   --   --   HEPARINUNFRC  --   --   --    < >  --  0.42 0.19* 0.69  --   --   --   CREATININE 0.70  --   --   --   --   --  0.74  --   --  0.70  --   TROPONINIHS 1,047* 1,364*  --   --  1,692*  --   --   --   --   --   --    < > = values in this interval not displayed.    Estimated Creatinine Clearance: 82.8 mL/min (by C-G formula based on SCr of 0.7 mg/dL).   Assessment: Patient is a 77 y.o M with hx upper esophageal stricture  who presented to the ED on 03/12/23 with respiratory distress. Troponin is noted to be elevated. Pharmacy has been consulted to dose heparin drip for ACS.  Today, 03/15/2023: -Heparin held this morning for bloody secretions -Previously having issues with ongoing bloody secretions and blood in foley -New afib today   Goal of Therapy:  Heparin level 0.3-0.7 units/ml Monitor platelets by anticoagulation protocol: Yes   Plan:  -Per discussion with CCM, will continue to hold heparin for now -Consult discontinued -Please re-engage if further  anticoagulation indicated  Pricilla Riffle, PharmD, BCPS Clinical Pharmacist 03/15/2023 12:23 PM

## 2023-03-15 NOTE — Progress Notes (Signed)
NAME:  Scott Newman, MRN:  [id number], DOB:  [birth year], LOS: 2 ADMISSION DATE:  03/12/2023, CONSULTATION DATE:  03/15/2023  REFERRING MD:  Dr Clarice Pole, CHIEF COMPLAINT: Respiratory failure on mechanical support  History of Present Illness:  77 yo male former smoker with hx of esophageal stricture who was scheduled for EGD with [name].  He was advised to have this done in the hospital.  He eats saltine crackers daily to keep his sodium level up.  He felt choked in the evening of 8/31 while eating crackers.  He was brought to ER and SpO2 in 70's, and chest xray showed bilateral infiltrates.  He required intubation in ER.  PCCM consulted to manage in ICU.  He has two MRN's.  Pertinent  Medical History  Esophageal stricture, Hyponatremia, IDA, Aortic stenosis, Cataract, HFpEF, Colon polyp, Depression, Gait disturbance, GERD, GI bleeding, ETOH, HTN, HLD, Hypothyroidism, Prostate cancer, Salmonella  Significant Hospital Events: Including procedures, antibiotic start and stop dates in addition to other pertinent events   8/31 Admit, intubated, start ABx 9/01 elevated troponin >> start heparin and levophed gtt 9/3 extubated  Interim History / Subjective:  On sedation Off pressors Awake and following commands Minimal vent support  Objective   Blood pressure (!) 207/72, pulse (!) 113, temperature 99 F (37.2 C), resp. rate (!) 21, height 5\' 8"  (1.727 m), weight 89.6 kg, SpO2 96%.    Vent Mode: PRVC FiO2 (%):  [30 %-40 %] 30 % Set Rate:  [16 bmp] 16 bmp Vt Set:  [540 mL] 540 mL PEEP:  [5 cmH20] 5 cmH20 Plateau Pressure:  [12 cmH20-18 cmH20] 18 cmH20   Intake/Output Summary (Last 24 hours) at 03/15/2023 1015 Last data filed at 03/15/2023 0915 Gross per 24 hour  Intake 2334.76 ml  Output 1175 ml  Net 1159.76 ml   Filed Weights   03/13/23 0415 03/14/23 0500 03/15/23 0500  Weight: 86.9 kg 90.9 kg 89.6 kg    Examination:  General - Elderly appearing male on vent HEENT - [place]/AT,  PEERL, no JVD Cardiac - regular rate/rhythm, 3/6 murmur Chest - bibasilar crackles Abdomen - Soft, NT. ND Extremities - no acute deformity or ROM limitation Skin - no rashes Neuro - RASS 0  Resolved Hospital Problem list     Assessment & Plan:   Acute hypoxic respiratory failure after choking on crackers. - likely from aspiration pneumonitis and acute pulmonary edema, bilateral pleural effusions - goal SpO2 > 92% - Extubate - Diuresis with lasix 40  Septic shock from aspiration pneumonitis. - day 4 of Unasyn - off pressors, hypertensive  AF RVR vs SVT developed this morning after extubation.  - Did not improve with adenosine x 1.  - Possibly due to agitation and delirium - Restart precedex - Haldol 2mg  once now  Hypertension - Cleviprex IV PRN to keep SBP < 160 mmhg - Lasix  Severe aortic stenosis. NTSTEMI with elevated troponin likely from demand ischemia. Chronic HFpEF. - Echo 9/01 >> EF 50 to 55%, mild LVH, RVSP 41.4 mmHg, severe LA dilation, mild MR, severe AS - continue heparin gtt for now - will need cardiology assessment when respiratory status more stable  Esophageal stricture. - followed by [facility] - will need EGD when medically stable  Chronic hyponatremia. - baseline Na 127 to 129 - follow up BMET  Hx of hypothyroidism. - continue synthroid  Hyperglycemia. - SSI - check HbA1C  Acute metabolic encephalopathy from sepsis. ICU delirium - RASS goal 0 - DC fentanyl  infusion - Continue Precedex  Staph in blood culture. - likely contaminant  Best Practice (right click and "Reselect all SmartList Selections" daily)   Diet/type: NPO DVT prophylaxis: systemic heparin GI prophylaxis: PPI Lines: N/A Foley:  Yes, and it is still needed Code Status:  full code Last date of multidisciplinary goals of care discussion [x]   Labs       Latest Ref Rng & Units 03/15/2023    5:37 AM 03/14/2023   10:37 AM 03/13/2023    4:59 AM  CMP  Glucose 70 - 99  mg/dL 578  469  629   BUN 8 - 23 mg/dL 13  9  8    Creatinine 0.61 - 1.24 mg/dL 5.28  4.13  2.44   Sodium 135 - 145 mmol/L 130  131  128   Potassium 3.5 - 5.1 mmol/L 3.8  3.9  4.4   Chloride 98 - 111 mmol/L 102  96  97   CO2 22 - 32 mmol/L 22  20  22    Calcium 8.9 - 10.3 mg/dL 8.3  8.4  8.8   Total Protein 6.5 - 8.1 g/dL  6.3    Total Bilirubin 0.3 - 1.2 mg/dL  1.5    Alkaline Phos 38 - 126 U/L  57    AST 15 - 41 U/L  51    ALT 0 - 44 U/L  40        Latest Ref Rng & Units 03/15/2023    7:10 AM 03/15/2023    1:55 AM 03/14/2023   10:37 AM  CBC  WBC 4.0 - 10.5 K/uL 7.3   13.9   Hemoglobin 13.0 - 17.0 g/dL 01.0  27.2  53.6   Hematocrit 39.0 - 52.0 % 45.4  42.8  46.4   Platelets 150 - 400 K/uL 145   184    ABG    Component Value Date/Time   PHART 7.45 03/13/2023 0305   PCO2ART 36 03/13/2023 0305   PO2ART 102 03/13/2023 0305   HCO3 25.2 03/13/2023 0305   ACIDBASEDEF 2.4 (H) 03/12/2023 2232   O2SAT 99.4 03/13/2023 0305    CBG (last 3)  Recent Labs    03/14/23 2347 03/15/23 0454 03/15/23 0752  GLUCAP 147* 148* 151*    Critical care time: 42 minutes    Joneen Roach, AGACNP-BC [facility]  See Amion for personal pager PCCM on call pager [phone] until 7pm. Please call Elink 7p-7a. [phone]  03/15/2023 10:22 AM

## 2023-03-15 NOTE — Procedures (Addendum)
03/15/2023 Extubated without issue. Lots of oral secretions being suctioned  Myrla Halsted MD PCCM  920: HTN, cleviprex ordered 935: Afib vs. NSVT, mag and amio ordered 10: refractory NSVT perhaps driven by agitation, adenosine ordered with some improvement, starting precedex and giving some haldol, PICC consult given multiple gtt and limited access

## 2023-03-15 NOTE — Progress Notes (Signed)
Upon assessment patient has new bleeding in oral mucosa. Urine is also now red with blood clots. E-link RN and Pharmacy both notified. Pharmacy stated to stop the heparin and re-evaluate in the morning.  Lance Coon, RN 03/15/2023 1:06 AM

## 2023-03-15 NOTE — Progress Notes (Addendum)
eLink Physician-Brief Progress Note Patient Name: Scott Newman DOB: 09-Nov-1945 MRN: [id number]   Date of Service  03/15/2023  HPI/Events of Note  77 year old with esophageal stricture in the presented with aspiration pneumonia.  Extubated today.  Has persistent agitation despite ongoing Precedex.  QTc 439.  eICU Interventions  Add on olanzapine IV.   8657 -low urine output from 9, persistently bloody.  Concern for urethral damage in the setting of catheter in place.  Consider urology consultation.  For now, initiate catheter flush every 6 hours.  Intervention Category Minor Interventions: Agitation / anxiety - evaluation and management  [name] 03/15/2023, 9:42 PM

## 2023-03-15 NOTE — Progress Notes (Signed)
After patient was extubated approx. 30 mins later patient starting getting more delirious, not cooperating with staff, and stating "we are trying to kill him." Restraints unfortunately having to be started back due to patient trying to pull off all medical equipment.

## 2023-03-15 NOTE — Progress Notes (Signed)
Patient's HR=50-52 but still agitated at times while also having periods of apnea and mental decline. Tritrated Precedex.  Elink RN notified and remotely assessed patient.   Elink MD called back and stated to keep Precedex on, continue to monitor HR and keep above 30s. Also stated to use olanzapine for severe agitation.   Lance Coon 03/15/2023, 11:45 PM

## 2023-03-15 NOTE — Plan of Care (Signed)
  Problem: Health Behavior/Discharge Planning: Goal: Ability to manage health-related needs will improve Outcome: Progressing   Problem: Nutrition: Goal: Adequate nutrition will be maintained Outcome: Progressing   

## 2023-03-15 NOTE — Procedures (Signed)
Extubation Procedure Note  Patient Details:   Name: ROZELLE YUILL DOB: 1945-08-21 MRN: [id number]   Airway Documentation:        Evaluation  O2 sats: stable throughout Complications: No apparent complications Patient did tolerate procedure well. Bilateral Breath Sounds: Clear, Diminished   Yes  Revonda Humphrey 03/15/2023, 9:46 AM

## 2023-03-15 NOTE — Progress Notes (Signed)
Let patient know CBC not much changed. Be sure to take b vitamin to help with large red blood cells.  Would you like to see a blood specialist about the persistently enlarged red blood cells? We can make hematology referral.

## 2023-03-16 DIAGNOSIS — J9601 Acute respiratory failure with hypoxia: Secondary | ICD-10-CM | POA: Diagnosis not present

## 2023-03-16 LAB — PROTEIN ELECTROPHORESIS, SERUM
Albumin ELP: 4.2 g/dL (ref 3.8–4.8)
Alpha 1: 0.3 g/dL (ref 0.2–0.3)
Alpha 2: 0.6 g/dL (ref 0.5–0.9)
Beta 2: 0.5 g/dL (ref 0.2–0.5)
Beta Globulin: 0.4 g/dL (ref 0.4–0.6)
Gamma Globulin: 1.4 g/dL (ref 0.8–1.7)
Total Protein: 7.3 g/dL (ref 6.1–8.1)

## 2023-03-16 LAB — CULTURE, BLOOD (ROUTINE X 2): Special Requests: ADEQUATE

## 2023-03-16 LAB — CBC
HCT: 44 % (ref 39.0–52.0)
Hemoglobin: 15 g/dL (ref 13.0–17.0)
MCH: 36.9 pg — ABNORMAL HIGH (ref 26.0–34.0)
MCHC: 34.1 g/dL (ref 30.0–36.0)
MCV: 108.1 fL — ABNORMAL HIGH (ref 80.0–100.0)
Platelets: 144 10*3/uL — ABNORMAL LOW (ref 150–400)
RBC: 4.07 MIL/uL — ABNORMAL LOW (ref 4.22–5.81)
RDW: 12.5 % (ref 11.5–15.5)
WBC: 12.8 10*3/uL — ABNORMAL HIGH (ref 4.0–10.5)
nRBC: 0 % (ref 0.0–0.2)

## 2023-03-16 LAB — GLUCOSE, CAPILLARY
Glucose-Capillary: 180 mg/dL — ABNORMAL HIGH (ref 70–99)
Glucose-Capillary: 182 mg/dL — ABNORMAL HIGH (ref 70–99)
Glucose-Capillary: 134 mg/dL — ABNORMAL HIGH (ref 70–99)
Glucose-Capillary: 164 mg/dL — ABNORMAL HIGH (ref 70–99)
Glucose-Capillary: 142 mg/dL — ABNORMAL HIGH (ref 70–99)
Glucose-Capillary: 123 mg/dL — ABNORMAL HIGH (ref 70–99)

## 2023-03-16 LAB — BASIC METABOLIC PANEL
Anion gap: 12 (ref 5–15)
BUN: 21 mg/dL (ref 8–23)
CO2: 25 mmol/L (ref 22–32)
Calcium: 8.5 mg/dL — ABNORMAL LOW (ref 8.9–10.3)
Chloride: 96 mmol/L — ABNORMAL LOW (ref 98–111)
Creatinine, Ser: 0.78 mg/dL (ref 0.61–1.24)
GFR, Estimated: >60 mL/min (ref >60)
Glucose, Bld: 176 mg/dL — ABNORMAL HIGH (ref 70–99)
Potassium: 3.5 mmol/L (ref 3.5–5.1)
Sodium: 133 mmol/L — ABNORMAL LOW (ref 135–145)

## 2023-03-16 LAB — PATHOLOGIST SMEAR REVIEW

## 2023-03-16 MED ORDER — ORAL CARE MOUTH RINSE
15.0000 mL | OROMUCOSAL | Status: DC
  Administered 2023-03-16 – 2023-03-17 (×3): 15 mL via OROMUCOSAL

## 2023-03-16 MED ORDER — ZIPRASIDONE MESYLATE 20 MG IM SOLR
10.0000 mg | Freq: Every evening | INTRAMUSCULAR | Status: DC | PRN
  Administered 2023-03-17: 10 mg via INTRAMUSCULAR
  Filled 2023-03-16 (×2): qty 20

## 2023-03-16 MED ORDER — FUROSEMIDE 10 MG/ML IJ SOLN
40.0000 mg | Freq: Every day | INTRAMUSCULAR | Status: DC
  Administered 2023-03-16 – 2023-03-19 (×4): 40 mg via INTRAVENOUS
  Filled 2023-03-16 (×4): qty 4

## 2023-03-16 MED ORDER — ORAL CARE MOUTH RINSE: 15.0000 mL | OROMUCOSAL | Status: DC | PRN

## 2023-03-16 MED ORDER — LORAZEPAM 2 MG/ML IJ SOLN
2.0000 mg | Freq: Once | INTRAMUSCULAR | Status: AC
  Administered 2023-03-16: 2 mg via INTRAVENOUS
  Filled 2023-03-16: qty 1

## 2023-03-16 MED ORDER — POTASSIUM CHLORIDE 10 MEQ/100ML IV SOLN
10.0000 meq | INTRAVENOUS | Status: AC
  Administered 2023-03-16 (×4): 10 meq via INTRAVENOUS
  Filled 2023-03-16 (×4): qty 100

## 2023-03-16 NOTE — Progress Notes (Addendum)
NAME:  Scott Newman, MRN:  [id number], DOB:  1945-09-06, LOS: 3 ADMISSION DATE:  03/12/2023, CONSULTATION DATE:  03/16/2023  REFERRING MD:  Dr Clarice Pole, CHIEF COMPLAINT: Respiratory failure on mechanical support  History of Present Illness:  77 yo male former smoker with hx of esophageal stricture who was scheduled for EGD with [name].  He was advised to have this done in the hospital.  He eats saltine crackers daily to keep his sodium level up.  He felt choked in the evening of 8/31 while eating crackers.  He was brought to ER and SpO2 in 70's, and chest xray showed bilateral infiltrates.  He required intubation in ER.  PCCM consulted to manage in ICU.  He has two MRN's.  Pertinent  Medical History  Esophageal stricture, Hyponatremia, IDA, Aortic stenosis, Cataract, HFpEF, Colon polyp, Depression, Gait disturbance, GERD, GI bleeding, ETOH, HTN, HLD, Hypothyroidism, Prostate cancer, Salmonella  Significant Hospital Events: Including procedures, antibiotic start and stop dates in addition to other pertinent events   8/31 Admit, intubated, start ABx 9/01 elevated troponin >> start heparin and levophed gtt 9/3 extubated  Interim History / Subjective:  Pretty tenuous after extubation due to delirium and secretion clearance issues; developed afib/RVR vs. SVT requiring amio gtt and adenosine  Current gtt: 0.5 precedex, amio 30  Objective   Blood pressure (!) 112/40, pulse (!) 46, temperature (!) 97.3 F (36.3 C), resp. rate (!) 22, height 5\' 8"  (1.727 m), weight 88.2 kg, SpO2 100%.        Intake/Output Summary (Last 24 hours) at 03/16/2023 0929 Last data filed at 03/16/2023 4098 Gross per 24 hour  Intake 1397.55 ml  Output 2800 ml  Net -1402.45 ml   Filed Weights   03/14/23 0500 03/15/23 0500 03/16/23 0701  Weight: 90.9 kg 89.6 kg 88.2 kg    Examination:  Frail elderly man moaning in bed MM dry, trachea midline Moves to command Very paranoid, AO to self Gurgling breath sounds,  he says he always has that issue at home? Ext minimal edema +murmur, brady  BMP ok WBC up slightly   Resolved Hospital Problem list     Assessment & Plan:   Acute hypoxic respiratory failure after choking on crackers. Acute metabolic encephalopathy from sepsis. ICU delirium- per wife has a pretty anxious controlling personality at baseline AF RVR vs SVT developed this morning after extubation correlated with agitation - Extubated 9/3 but terribly weak cough and issues with delirium - More lasix, encourage day night cycles, see if we can get him to PRN haldol instead of precedex drip - High risk for reintubation - Trial of at bedtime PRN geodon as well - Check AM EKG for QTc  Septic shock from aspiration pneumonitis-resolved - complete 5 days unasyn - off pressors, hypertensive  Severe aortic stenosis. NTSTEMI with elevated troponin likely from demand ischemia. Chronic HFpEF. - Echo 9/01 >> EF 50 to 55%, mild LVH, RVSP 41.4 mmHg, severe LA dilation, mild MR, severe AS - heparin on hold for hematuria 9/3; no strong indication to resume unless there is recurrence of afib - will need cardiology assessment for ?TAVR when respiratory status more stable  Esophageal stricture. - followed [facility] - will need EGD when medically stable, not there yet  Hx of hypothyroidism. - continue synthroid  Hematuria- improved with cessation of heparin gtt  Hyperglycemia. - SSI - check HbA1C   Best Practice (right click and "Reselect all SmartList Selections" daily)   Diet/type: NPO pending SLP  eval DVT prophylaxis: systemic heparin GI prophylaxis: PPI Lines: N/A Foley:  Yes, and it is still needed Code Status:  full code Last date of multidisciplinary goals of care discussion [wife updated 03/16/23]  Labs       Latest Ref Rng & Units 03/16/2023    5:27 AM 03/15/2023    5:37 AM 03/14/2023   10:37 AM  CMP  Glucose 70 - 99 mg/dL 578  469  629   BUN 8 - 23 mg/dL 21  13  9     Creatinine 0.61 - 1.24 mg/dL 5.28  4.13  2.44   Sodium 135 - 145 mmol/L 133  130  131   Potassium 3.5 - 5.1 mmol/L 3.5  3.8  3.9   Chloride 98 - 111 mmol/L 96  102  96   CO2 22 - 32 mmol/L 25  22  20    Calcium 8.9 - 10.3 mg/dL 8.5  8.3  8.4   Total Protein 6.5 - 8.1 g/dL   6.3   Total Bilirubin 0.3 - 1.2 mg/dL   1.5   Alkaline Phos 38 - 126 U/L   57   AST 15 - 41 U/L   51   ALT 0 - 44 U/L   40       Latest Ref Rng & Units 03/16/2023    5:27 AM 03/15/2023    7:30 PM 03/15/2023    1:52 PM  CBC  WBC 4.0 - 10.5 K/uL 12.8     Hemoglobin 13.0 - 17.0 g/dL 01.0  27.2  53.6   Hematocrit 39.0 - 52.0 % 44.0  46.7  42.8   Platelets 150 - 400 K/uL 144      ABG    Component Value Date/Time   PHART 7.45 03/13/2023 0305   PCO2ART 36 03/13/2023 0305   PO2ART 102 03/13/2023 0305   HCO3 25.2 03/13/2023 0305   ACIDBASEDEF 2.4 (H) 03/12/2023 2232   O2SAT 99.4 03/13/2023 0305    CBG (last 3)  Recent Labs    03/16/23 0358 03/16/23 0533 03/16/23 0730  GLUCAP 180* 182* 134*    Critical care time: 31 minutes    Myrla Halsted MD [facility]  See Amion for personal pager PCCM on call pager [phone] until 7pm. Please call Elink 7p-7a. [phone]  03/16/2023 9:29 AM

## 2023-03-16 NOTE — Evaluation (Signed)
Clinical/Bedside Swallow Evaluation Patient Details  Name: Scott Newman MRN: [id number] Date of Birth: July 24, 1945  Today's Date: 03/16/2023 Time: SLP Start Time (ACUTE ONLY): 1445 SLP Stop Time (ACUTE ONLY): 1525 SLP Time Calculation (min) (ACUTE ONLY): 40 min  Past Medical History: No past medical history on file. Past Surgical History: . HPI:  Pt adm to [facility] after respiratory failure - he felt choked in the evening of 8/31 while eating crackers.  He was brought to ER and SpO2 in 70's, and chest xray showed bilateral infiltrates.  He required intubation in ER.  PCCM consulted to manage in ICU.  Sepsis from pna.  Former smokier, Esophageal stricture, Hyponatremia, IDA, Aortic stenosis, Cataract, HFpEF, Colon polyp, Depression, Gait disturbance, GERD, GI bleeding, ETOH, HTN, HLD, Hypothyroidism, Prostate cancer, Salmonella.  He was intubated 8/31-03/15/2023.  Swallow eval ordered.  He developed AF RVR vs SVT the day following extubation.  He has ben requiring precedex and was agitated while on the vent.  Pt also with prior pons chronic micro ischemia/atrophy per prior imaging 10/2014.    Assessment / Plan / Recommendation  Clinical Impression  Patient currently presents with clincal indications of severe dysphagia - likely exacerbated by current mentation and potential pharyngeal/laryngeal edema from intubation *pt frequently agitated thus suspect some extraneous movement.  Currently pt is having a hard time managing secretions - coughing without and with minimal moisture.  He has h/o known cervical esophageal narrowing and was dilated in the past by ENT (GI unable to get scope into esophagus) per wife.  Per discussion with wife, she reports pt has lost 20 pounds in the last several months and has been sleeping "a lot" with decreased mobility.  She also endorses pt has been coughing with intake for years.  Pt likely has been having some low grade chronic aspiration that he has managed when he was well  but given progressive recent decline concern for swallow function to be manageable for adequate nutrition and hydration.  Wife reports pt was to see her ENT in October for potential surgical intervention again.  At this time recommend npo x single ice chips SMALL and oral moisture.   Educated pt/wife to concerns and plans for SLP to follow up for po readiness.  Given level of dysphagia PTA - recommend MBS prior to initiation of po. SLP Visit Diagnosis: Dysphagia, pharyngoesophageal phase (R13.14)    Aspiration Risk  Risk for inadequate nutrition/hydration;Severe aspiration risk    Diet Recommendation Ice chips PRN after oral care;Other (Comment) (oral moisture- only single small ice chips)    Medication Administration: Via alternative means Compensations: Slow rate;Small sips/bites    Other  Recommendations Oral Care Recommendations: Oral care QID    Recommendations for follow up therapy are one component of a multi-disciplinary discharge planning process, led by the attending physician.  Recommendations may be updated based on patient status, additional functional criteria and insurance authorization.  Follow up Recommendations Follow physician's recommendations for discharge plan and follow up therapies      Assistance Recommended at Discharge    Functional Status Assessment Patient has had a recent decline in their functional status and/or demonstrates limited ability to make significant improvements in function in a reasonable and predictable amount of time  Frequency and Duration min 1 x/week  1 week       Prognosis Prognosis for improved oropharyngeal function: Fair      Swallow Study   General HPI: Pt adm to [facility] after respiratory failure - he felt choked  in the evening of 8/31 while eating crackers.  He was brought to ER and SpO2 in 70's, and chest xray showed bilateral infiltrates.  He required intubation in ER.  PCCM consulted to manage in ICU.  Sepsis from pna.  Former  smokier, Esophageal stricture, Hyponatremia, IDA, Aortic stenosis, Cataract, HFpEF, Colon polyp, Depression, Gait disturbance, GERD, GI bleeding, ETOH, HTN, HLD, Hypothyroidism, Prostate cancer, Salmonella.  He was intubated 8/31-03/15/2023.  Swallow eval ordered.  He developed AF RVR vs SVT the day following extubation.  He has ben requiring precedex and was agitated while on the vent.  Pt also with prior pons chronic micro ischemia/atrophy per prior imaging 10/2014. Type of Study: Bedside Swallow Evaluation Diet Prior to this Study: NPO Temperature Spikes Noted: No Respiratory Status: Nasal cannula History of Recent Intubation: Yes Total duration of intubation (days): 4 days Date extubated: 03/15/23 Behavior/Cognition: Lethargic/Drowsy Oral Cavity Assessment: Other (comment) (clear demarcation between anterior oral cavity and posterior - evidenced by erythema) Oral Care Completed by SLP: Yes Oral Cavity - Dentition: Adequate natural dentition Vision: Impaired for self-feeding Self-Feeding Abilities: Total assist Patient Positioning: Upright in bed Baseline Vocal Quality: Low vocal intensity Volitional Cough: Weak Volitional Swallow: Unable to elicit    Oral/Motor/Sensory Function Overall Oral Motor/Sensory Function: Other (comment) (pt with mostly open mouth posture)   Ice Chips Ice chips: Impaired Presentation: Spoon Oral Phase Impairments: Reduced labial seal;Reduced lingual movement/coordination Oral Phase Functional Implications: Prolonged oral transit Pharyngeal Phase Impairments: Suspected delayed Swallow;Cough - Immediate;Cough - Delayed Other Comments: small single ice chip   Thin Liquid Thin Liquid: Impaired Presentation: Spoon Oral Phase Impairments: Reduced labial seal;Reduced lingual movement/coordination Oral Phase Functional Implications: Prolonged oral transit;Oral holding Pharyngeal  Phase Impairments: Suspected delayed Swallow;Multiple swallows;Cough - Immediate;Cough -  Delayed    Nectar Thick Nectar Thick Liquid: Not tested   Honey Thick Honey Thick Liquid: Not tested   Puree Puree: Not tested   Solid     Solid: Not tested      Chales Abrahams 03/16/2023,4:14 PM   Rolena Infante, MS [facility] SLP Acute Rehab Services Office [phone]

## 2023-03-16 NOTE — Plan of Care (Signed)
  Problem: Education: Goal: Knowledge of General Education information will improve Description: Including pain rating scale, medication(s)/side effects and non-pharmacologic comfort measures Outcome: Progressing   Problem: Health Behavior/Discharge Planning: Goal: Ability to manage health-related needs will improve Outcome: Progressing   Problem: Clinical Measurements: Goal: Ability to maintain clinical measurements within normal limits will improve Outcome: Progressing Goal: Respiratory complications will improve Outcome: Progressing   Problem: Coping: Goal: Level of anxiety will decrease Outcome: Progressing   Problem: Skin Integrity: Goal: Risk for impaired skin integrity will decrease Outcome: Adequate for Discharge

## 2023-03-16 NOTE — Progress Notes (Signed)
Patient currently ICU hospitalized with acute respiratory failure from inhalation dry food particles (cracker crumbs). Ensure follow up after hospital for that and we can discuss this more then.

## 2023-03-17 ENCOUNTER — Inpatient Hospital Stay (HOSPITAL_COMMUNITY): Payer: PPO

## 2023-03-17 DIAGNOSIS — J9601 Acute respiratory failure with hypoxia: Secondary | ICD-10-CM | POA: Diagnosis not present

## 2023-03-17 LAB — CBC
HCT: 43 % (ref 39.0–52.0)
Hemoglobin: 14.6 g/dL (ref 13.0–17.0)
MCH: 36.6 pg — ABNORMAL HIGH (ref 26.0–34.0)
MCHC: 34 g/dL (ref 30.0–36.0)
MCV: 107.8 fL — ABNORMAL HIGH (ref 80.0–100.0)
Platelets: 158 10*3/uL (ref 150–400)
RBC: 3.99 MIL/uL — ABNORMAL LOW (ref 4.22–5.81)
RDW: 12.6 % (ref 11.5–15.5)
WBC: 10.4 10*3/uL (ref 4.0–10.5)
nRBC: 0 % (ref 0.0–0.2)

## 2023-03-17 LAB — GLUCOSE, CAPILLARY
Glucose-Capillary: 119 mg/dL — ABNORMAL HIGH (ref 70–99)
Glucose-Capillary: 122 mg/dL — ABNORMAL HIGH (ref 70–99)
Glucose-Capillary: 126 mg/dL — ABNORMAL HIGH (ref 70–99)
Glucose-Capillary: 129 mg/dL — ABNORMAL HIGH (ref 70–99)
Glucose-Capillary: 133 mg/dL — ABNORMAL HIGH (ref 70–99)
Glucose-Capillary: 137 mg/dL — ABNORMAL HIGH (ref 70–99)
Glucose-Capillary: 160 mg/dL — ABNORMAL HIGH (ref 70–99)

## 2023-03-17 LAB — BASIC METABOLIC PANEL
Anion gap: 14 (ref 5–15)
BUN: 32 mg/dL — ABNORMAL HIGH (ref 8–23)
CO2: 25 mmol/L (ref 22–32)
Calcium: 8.8 mg/dL — ABNORMAL LOW (ref 8.9–10.3)
Chloride: 97 mmol/L — ABNORMAL LOW (ref 98–111)
Creatinine, Ser: 0.9 mg/dL (ref 0.61–1.24)
GFR, Estimated: >60 mL/min (ref >60)
Glucose, Bld: 211 mg/dL — ABNORMAL HIGH (ref 70–99)
Potassium: 3.7 mmol/L (ref 3.5–5.1)
Sodium: 136 mmol/L (ref 135–145)

## 2023-03-17 LAB — PHOSPHORUS: Phosphorus: 4.9 mg/dL — ABNORMAL HIGH (ref 2.5–4.6)

## 2023-03-17 LAB — MAGNESIUM: Magnesium: 2.4 mg/dL (ref 1.7–2.4)

## 2023-03-17 MED ORDER — POLYETHYLENE GLYCOL 3350 17 G PO PACK
17.0000 g | PACK | Freq: Every day | ORAL | Status: DC | PRN
  Filled 2023-03-17: qty 1

## 2023-03-17 MED ORDER — LIDOCAINE VISCOUS HCL 2 % MT SOLN
15.0000 mL | Freq: Once | OROMUCOSAL | Status: AC
  Administered 2023-03-17: 15 mL via OROMUCOSAL
  Filled 2023-03-17: qty 15

## 2023-03-17 MED ORDER — IOHEXOL 300 MG/ML  SOLN
50.0000 mL | Freq: Once | INTRAMUSCULAR | Status: AC | PRN
  Administered 2023-03-17: 10 mL

## 2023-03-17 MED ORDER — HYDRALAZINE HCL 20 MG/ML IJ SOLN
10.0000 mg | INTRAMUSCULAR | Status: DC | PRN
  Administered 2023-03-17 (×3): 10 mg via INTRAVENOUS
  Administered 2023-03-18 (×2): 20 mg via INTRAVENOUS
  Filled 2023-03-17 (×4): qty 1
  Filled 2023-03-17: qty 2

## 2023-03-17 MED ORDER — BUTAMBEN-TETRACAINE-BENZOCAINE 2-2-14 % EX AERO
2.0000 | INHALATION_SPRAY | Freq: Once | CUTANEOUS | Status: AC
  Administered 2023-03-17: 2 via TOPICAL

## 2023-03-17 MED ORDER — METHYLPREDNISOLONE SODIUM SUCC 125 MG IJ SOLR
125.0000 mg | Freq: Once | INTRAMUSCULAR | Status: AC
  Administered 2023-03-17: 125 mg via INTRAVENOUS
  Filled 2023-03-17: qty 2

## 2023-03-17 MED ORDER — FAMOTIDINE IN NACL 20-0.9 MG/50ML-% IV SOLN
20.0000 mg | Freq: Once | INTRAVENOUS | Status: AC
  Administered 2023-03-17: 20 mg via INTRAVENOUS
  Filled 2023-03-17: qty 50

## 2023-03-17 MED ORDER — LORAZEPAM 2 MG/ML IJ SOLN
1.0000 mg | INTRAMUSCULAR | Status: AC | PRN
  Administered 2023-03-17 (×2): 1 mg via INTRAVENOUS
  Filled 2023-03-17 (×2): qty 1

## 2023-03-17 NOTE — Plan of Care (Signed)
  Problem: Education: Goal: Knowledge of General Education information will improve Description: Including pain rating scale, medication(s)/side effects and non-pharmacologic comfort measures Outcome: Progressing   Problem: Health Behavior/Discharge Planning: Goal: Ability to manage health-related needs will improve Outcome: Progressing   Problem: Clinical Measurements: Goal: Ability to maintain clinical measurements within normal limits will improve Outcome: Progressing Goal: Will remain free from infection Outcome: Progressing Goal: Diagnostic test results will improve Outcome: Progressing Goal: Respiratory complications will improve Outcome: Progressing Goal: Cardiovascular complication will be avoided Outcome: Progressing   Problem: Activity: Goal: Risk for activity intolerance will decrease Outcome: Progressing   Problem: Elimination: Goal: Will not experience complications related to bowel motility Outcome: Progressing Goal: Will not experience complications related to urinary retention Outcome: Progressing   Problem: Pain Managment: Goal: General experience of comfort will improve Outcome: Progressing   Problem: Safety: Goal: Ability to remain free from injury will improve Outcome: Progressing   Problem: Skin Integrity: Goal: Risk for impaired skin integrity will decrease Outcome: Progressing   Problem: Education: Goal: Ability to describe self-care measures that may prevent or decrease complications (Diabetes Survival Skills Education) will improve Outcome: Progressing Goal: Individualized Educational Video(s) Outcome: Progressing   Problem: Coping: Goal: Ability to adjust to condition or change in health will improve Outcome: Progressing   Problem: Fluid Volume: Goal: Ability to maintain a balanced intake and output will improve Outcome: Progressing   Problem: Health Behavior/Discharge Planning: Goal: Ability to identify and utilize available  resources and services will improve Outcome: Progressing Goal: Ability to manage health-related needs will improve Outcome: Progressing   Problem: Metabolic: Goal: Ability to maintain appropriate glucose levels will improve Outcome: Progressing   Problem: Nutritional: Goal: Maintenance of adequate nutrition will improve Outcome: Progressing Goal: Progress toward achieving an optimal weight will improve Outcome: Progressing   Problem: Skin Integrity: Goal: Risk for impaired skin integrity will decrease Outcome: Progressing   Problem: Tissue Perfusion: Goal: Adequacy of tissue perfusion will improve Outcome: Progressing   Problem: Nutrition: Goal: Adequate nutrition will be maintained Outcome: Not Progressing   Problem: Coping: Goal: Level of anxiety will decrease Outcome: Not Progressing   Problem: Safety: Goal: Non-violent Restraint(s) Outcome: Not Progressing

## 2023-03-17 NOTE — Progress Notes (Signed)
Nutrition Follow-up  DOCUMENTATION CODES:   Not applicable  INTERVENTION:  - Plan for MBS.  - If diet advanced, recommend Ensure Plus High Protein po BID, each supplement provides 350 kcal and 20 grams of protein.  - If patient does not pass or if concern he will not be able to meet needs orally and plan for SBFT placement, would recommend below TF regimen: Osmolite 1.5 at 55 ml/h (1320 ml per day) *Would recommend starting at 68mL/hr and advancing by 10mL Q8H Prosource TF20 60 ml daily Provides 2060 kcal, 103 gm protein, 1006 ml free water daily  - Monitor magnesium, potassium, and phosphorus BID for at least 3 days, MD to replete as needed, as pt is at risk for refeeding syndrome.  - Monitor weight trends.   NUTRITION DIAGNOSIS:   Inadequate oral intake related to inability to eat as evidenced by NPO status. *ongoing  GOAL:   Patient will meet greater than or equal to 90% of their needs *not met  MONITOR:   Vent status, Labs, Weight trends, TF tolerance  REASON FOR ASSESSMENT:   Consult Enteral/tube feeding initiation and management  ASSESSMENT:   77 year old male with PMH of upper esophageal stricture, depression, HTN, HLD who presented after choking on crackers. Admitted with acute hypoxic respiratory failure from aspiration pneumonitis.  9/1 Admit, Intubated  9/2 TF started at trickle 9/3 Extubated 9/4 SLP eval, rec'd NPO  Visited with patient, moaning slightly but not very alert. No family at bedside.   RN attempted to place NGT today but tube unable to be placed. Per CCM, plan for MBS today as well as IR to place SBFT. It is noted patient has esophageal stricture and has had to have dilations by ENT in the past.   Admit weight: 190# Current weight: 198# I&O's:+2.2L + for edema  Medications reviewed and include: Colace, Miralax  Labs reviewed:  Phosphorus 4.9   Diet Order:   Diet Order             Diet NPO time specified  Diet effective now                    EDUCATION NEEDS:  Not appropriate for education at this time  Skin:  Skin Assessment: Reviewed RN Assessment  Last BM:  PTA  Height:  Ht Readings from Last 1 Encounters:  03/14/23 5\' 8"  (1.727 m)   Weight:  Wt Readings from Last 1 Encounters:  03/17/23 89.9 kg   Ideal Body Weight:  70 kg  BMI:  Body mass index is 30.14 kg/m.  Estimated Nutritional Needs:  Kcal:  1900-2150 kcals Protein:  95-110 grams Fluid:  >/= 1.9L    Shelle Iron RD, LDN For contact information, refer to [facility].

## 2023-03-17 NOTE — Progress Notes (Signed)
Speech Language Pathology Treatment: Dysphagia  Patient Details Name: Scott Newman MRN: [id number] DOB: 1946/06/03 Today's Date: 03/17/2023 Time: 2951-8841 SLP Time Calculation (min) (ACUTE ONLY): 20 min  Assessment / Plan / Recommendation Clinical Impression  Patient seen to assess readiness for p.o. diet and or instrumental swallow evaluation.  Noted events from overnight.  Patient greeted lethargic in bed with facial erythema but denied discomfort and awoken with total tactile, visual and verbal stimulation.  Patient is xerostomic and was agreeable to allow SLP to provide oral moisture.  He does report sensation of plastic in his mouth for which SLP provided clearance with paper towel.  With oral moisture from toothette, patient with delayed sequential swallows and gagging with overt coughing with removal of viscous blood-tinged secretions.  Fortunately this allowed him to clear copious amounts of secretions and his voice and mentation was improved at that time.  SLP then provided patient with nectar thick liquid via teaspoon and cup with no overt indication of aspiration but he continues with multiple swallows concerning for retention.  Scott Newman reports patient was eating well prior to admission and does cough some.  States that his weight loss of 20 pounds she believes is due to him preparing his own foods.  In addition she reports patient was gargling with vitamin C powder with water and finding this helpful with his swallowing ability.  She does continue to endorse that patient slept often at home.  At this time to determine if patient could tolerate any p.o. intake and to help with potential establishment of appropriate swallowing exercises, will proceed with MBS.    Given patient was lethargic upon admission to room however advised wife that if patient is not fully alert we will be unable to do a modified barium swallow today.  Relayed information to RN.    HPI HPI: Pt adm to [facility]  after respiratory failure - he felt choked in the evening of 8/31 while eating crackers.  He was brought to ER and SpO2 in 70's, and chest xray showed bilateral infiltrates.  He required intubation in ER.  PCCM consulted to manage in ICU.  Sepsis from pna.  Former smokier, Esophageal stricture, Hyponatremia, IDA, Aortic stenosis, Cataract, HFpEF, Colon polyp, Depression, Gait disturbance, GERD, GI bleeding, ETOH, HTN, HLD, Hypothyroidism, Prostate cancer, Salmonella.  He was intubated 8/31-03/15/2023.  Swallow eval ordered.  He developed AF RVR vs SVT the day following extubation.  He has ben requiring precedex and was agitated while on the vent.  Pt also with prior pons chronic micro ischemia/atrophy per prior imaging 10/2014.      SLP Plan  MBS      Recommendations for follow up therapy are one component of a multi-disciplinary discharge planning process, led by the attending physician.  Recommendations may be updated based on patient status, additional functional criteria and insurance authorization.    Recommendations  Diet recommendations: Other(comment);NPO (oral moisture as well as single small ice chips only) Medication Administration: Via alternative means                  Oral care QID   Frequent or constant Supervision/Assistance Dysphagia, pharyngoesophageal phase (R13.14)     MBS    Scott Infante, MS [facility] SLP Acute Rehab Services Office [phone]  Scott Newman  03/17/2023, 12:34 PM

## 2023-03-17 NOTE — Progress Notes (Addendum)
SLP Cancellation Note  Patient Details Name: Scott Newman MRN: [id number] DOB: 05-15-1946   Cancelled treatment:       Reason Eval/Treat Not Completed: Other (comment) (Per RN, pt to get NG placed today for nutrition, will continue efforts, concern for prognosis for swallow function present given chronicity of dysphagia PTA)   Advised MD and RN that NG tube placement may be difficult given wife reports GI was unable to get scope into esophagus previously and pt has h/o proximal esophageal strictures.  Thanks.    Chales Abrahams 03/17/2023, 10:37 AM  Rolena Infante, MS [facility] SLP Acute Rehab Services Office [phone]

## 2023-03-17 NOTE — Plan of Care (Signed)
  Problem: Skin Integrity: Goal: Risk for impaired skin integrity will decrease Outcome: Progressing   

## 2023-03-17 NOTE — Progress Notes (Signed)
eLink Physician-Brief Progress Note Patient Name: Scott Newman DOB: [birth year] MRN: [id number]   Date of Service  03/17/2023  HPI/Events of Note  78 year old male initially presented with esophageal stricture and aspiration requiring intubation.  He got extubated 9/3 he continues to have ongoing respiratory distress requiring oxygen therapy (5 L).  Had wheezing tonight and received a breathing treatment but heavy breathing and sounds "wet ".  eICU Interventions  Plan for diuresis this morning.  Net neutral in/outs the past 24 hours.  Continue observation for now.     Intervention Category Minor Interventions: Clinical assessment - ordering diagnostic tests  Cyle Kenyon 03/17/2023, 5:36 AM

## 2023-03-17 NOTE — Progress Notes (Signed)
PCCM INTERVAL PROGRESS NOTE  Red facial rash migrating to chest. Worsening after unasyn started.  Not a typical drug rash. Will stop unasyn as he has had 5 days worth for aspiration.  Will treat with steroids/pepcid. Hold benadryl for QTC     Joneen Roach, AGACNP-BC [facility]  See Amion for personal pager PCCM on call pager [phone] until 7pm. Please call Elink 7p-7a. [phone]  03/17/2023 2:48 PM

## 2023-03-17 NOTE — Progress Notes (Signed)
NAME:  Scott Newman, MRN:  [id number], DOB:  10/19/1945, LOS: 4 ADMISSION DATE:  03/12/2023, CONSULTATION DATE:  03/17/2023  REFERRING MD:  Dr Clarice Pole, CHIEF COMPLAINT: Respiratory failure on mechanical support  History of Present Illness:  77 yo male former smoker with hx of esophageal stricture who was scheduled for EGD with [name].  He was advised to have this done in the hospital.  He eats saltine crackers daily to keep his sodium level up.  He felt choked in the evening of 8/31 while eating crackers.  He was brought to ER and SpO2 in 70's, and chest xray showed bilateral infiltrates.  He required intubation in ER.  PCCM consulted to manage in ICU.  He has two MRN's.  Pertinent  Medical History  Esophageal stricture, Hyponatremia, IDA, Aortic stenosis, Cataract, HFpEF, Colon polyp, Depression, Gait disturbance, GERD, GI bleeding, ETOH, HTN, HLD, Hypothyroidism, Prostate cancer, Salmonella  Significant Hospital Events: Including procedures, antibiotic start and stop dates in addition to other pertinent events   8/31 Admit, intubated, start ABx 9/01 elevated troponin >> start heparin and levophed gtt 9/3 extubated 9/4 ongoing delirium and agitation limiting progression. AF-RVR amiodarone started.   Interim History / Subjective:  Delirium slowly improving. More calm today. More directable and answering more questions appropriately.    Objective   Blood pressure (!) 160/46, pulse (!) 58, temperature (!) 96.8 F (36 C), resp. rate (!) 23, height 5\' 8"  (1.727 m), weight 89.9 kg, SpO2 95%.        Intake/Output Summary (Last 24 hours) at 03/17/2023 1040 Last data filed at 03/17/2023 0856 Gross per 24 hour  Intake 1263.51 ml  Output 825 ml  Net 438.51 ml   Filed Weights   03/15/23 0500 03/16/23 0701 03/17/23 0447  Weight: 89.6 kg 88.2 kg 89.9 kg    Examination:  Elderly male resting comfortably in bed (examined after Haldol admin) MM dry, PERRL, no JVD Somnolent will arouse to  answer questions and follow commands.  Clear bilateral breath sounds.  Some postpharyngeal secretions, but able to clear with cough.  Trace pedal edema.  Brady, regular, murmur.    Resolved Hospital Problem list   Septic shock   Assessment & Plan:   Acute hypoxic respiratory failure after choking on crackers. Acute metabolic encephalopathy from sepsis. ICU delirium- per wife has a pretty anxious controlling personality at baseline - More lasix, encourage day night cycles - Re-intubation risk - QTC prolonged so unfortunately we will have to defer further haldol/geodon for today. - May be forced to rely on PRN benzo  - Precedex off since yesterday - Check AM EKG for QTc  Aspiration pneumonitis-resolved - complete 5 days unasyn > 9/6 - off pressors, hypertensive  AF RVR vs SVT developed this morning after extubation correlated with agitation - Continue amiodarone drip for now - Low threshold to DC  Severe aortic stenosis. NTSTEMI with elevated troponin likely from demand ischemia. Chronic HFpEF. - Echo 9/01 >> EF 50 to 55%, mild LVH, RVSP 41.4 mmHg, severe LA dilation, mild MR, severe AS - heparin on hold for hematuria 9/3 (started for NSTEMI, stopped after 48 hours); no strong indication to resume unless there is recurrence of afib - will need cardiology assessment for ?TAVR when respiratory status more stable  Esophageal stricture - followed by [facility] - will need EGD when medically stable, not there yet. EGD scope unable to pass previously. Dilated by ENT Pollyann Kennedy) in the past.  - MBS today and IR to  place small bore feeding tube.   Hx of hypothyroidism. - continue synthroid once enteral access obtained  Hematuria- improved with cessation of heparin gtt  Hyperglycemia. - SSI   Best Practice (right click and "Reselect all SmartList Selections" daily)   Diet/type: NPO pending SLP eval DVT prophylaxis: systemic heparin GI prophylaxis: PPI Lines: N/A Foley:  Yes,  and it is still needed Code Status:  full code Last date of multidisciplinary goals of care discussion [wife updated 03/16/23]  Labs       Latest Ref Rng & Units 03/17/2023    4:26 AM 03/16/2023    5:27 AM 03/15/2023    5:37 AM  CMP  Glucose 70 - 99 mg/dL 161  096  045   BUN 8 - 23 mg/dL 32  21  13   Creatinine 0.61 - 1.24 mg/dL 4.09  8.11  9.14   Sodium 135 - 145 mmol/L 136  133  130   Potassium 3.5 - 5.1 mmol/L 3.7  3.5  3.8   Chloride 98 - 111 mmol/L 97  96  102   CO2 22 - 32 mmol/L 25  25  22    Calcium 8.9 - 10.3 mg/dL 8.8  8.5  8.3       Latest Ref Rng & Units 03/17/2023    4:26 AM 03/16/2023    5:27 AM 03/15/2023    7:30 PM  CBC  WBC 4.0 - 10.5 K/uL 10.4  12.8    Hemoglobin 13.0 - 17.0 g/dL 78.2  95.6  21.3   Hematocrit 39.0 - 52.0 % 43.0  44.0  46.7   Platelets 150 - 400 K/uL 158  144     ABG    Component Value Date/Time   PHART 7.45 03/13/2023 0305   PCO2ART 36 03/13/2023 0305   PO2ART 102 03/13/2023 0305   HCO3 25.2 03/13/2023 0305   ACIDBASEDEF 2.4 (H) 03/12/2023 2232   O2SAT 99.4 03/13/2023 0305    CBG (last 3)  Recent Labs    03/17/23 0015 03/17/23 0424 03/17/23 0745  GLUCAP 119* 126* 129*    Critical care time: 36 minutes     Joneen Roach, AGACNP-BC [facility]  See Amion for personal pager PCCM on call pager [phone] until 7pm. Please call Elink 7p-7a. [phone]  03/17/2023 12:17 PM

## 2023-03-17 NOTE — Progress Notes (Addendum)
Modified Barium Swallow Study  Patient Details  Name: Scott Newman MRN: [id number] Date of Birth: 06-14-1946  Today's Date: 03/17/2023  Modified Barium Swallow completed.  Full report located under Chart Review in the Imaging Section.  History of Present Illness Pt adm to [facility] after respiratory failure - he felt choked in the evening of 8/31 while eating crackers.  He was brought to ER and SpO2 in 70's, and chest xray showed bilateral infiltrates.  He required intubation in ER.  PCCM consulted to manage in ICU.  Sepsis from pna.  Former smokier, Esophageal stricture, Hyponatremia, IDA, Aortic stenosis, Cataract, HFpEF, Colon polyp, Depression, Gait disturbance, GERD, GI bleeding, ETOH, HTN, HLD, Hypothyroidism, Prostate cancer, Salmonella.  He was intubated 8/31-03/15/2023.  Swallow eval ordered.  He developed AF RVR vs SVT the day following extubation.  He has ben requiring precedex and was agitated while on the vent.  Pt also with prior pons chronic micro ischemia/atrophy per prior imaging 10/2014.   Clinical Impression Patient presents with moderate oropharyngeal dysphagia with sensorimotor impairments in addition to known h/o proximal esophageal narrowing requiring dilatation.   Oral deficits characterized by impaired lingual propulsive strength and coordination resulting in premature spillage of barium into pharynx and oral retention.   Pharyngeal dysphagia marked impaired muscular contraction impacting tongue base retraction and adequacy of laryngeal elevation and closure.  Partial approximation of arytenoids to epiglottic petiole allows penetration of liquids during swallow without adequate clearance despite cued cough.  Retention of secretions in pharynx noted that mixed with barium without full clearance.  Patient without reported sensation to pharyngeal retention but does conduct intermittent dry swallows which helps to diminish residue but not fully clear.  Reclining patient in chair appeared  to marginally aid in maintaining barium residual in the posterior pharynx.      Scott Newman was participative during evaluation but required total cues to close his lips and swallow and he became more fatigued with increased blood pressure and work of breathing than his baseline after just a few boluses.  SLP ceased study as patient is to get an NG placed under fluoroscopy - SLP did not want severe barium retention in pharynx with significantly elevated aspiration risk with tube placement. PA is present to facilitate this placement.  Suboptimal view due to patient's shoulders partially blocking view.  Did not pan to patient's esophagus due to limits from patient's gross deconditioning, excessive lines.    A Et this time recommend continue single small ice chips and oral moisture to decrease disuse muscle atrophy.  Patient will benefit from aggressive SLP to help mitigate patient's dysphagia via swallowing exercises, RN ST and p.o. trials.   Educated wife thoroughly to recommendations as SLP walked her back to ICU to the patient's room.  Will follow-up for dysphagia treatment.  Hopeful for improvement with patient's strengthening from nutrition as well as with active therapy. Factors that may increase risk of adverse event in presence of aspiration Scott Newman & Clearance Scott Newman 2021): Reduced saliva;Frail or deconditioned;Limited mobility;Reduced cognitive function;Weak cough;Dependence for feeding and/or oral hygiene;Poor general health and/or compromised immunity;Respiratory or GI disease;Inadequate oral hygiene  Swallow Evaluation Recommendations Recommendations: Ice chips PRN after oral care (Critical care service has ordered NG to be placed) Liquid Administration via:  (Moisture via Toothette) Medication Administration: Via alternative means Supervision: Full supervision/cueing for swallowing strategies Swallowing strategies  : Multiple dry swallows after each bite/sip;Slow rate (Cues to cough and expectorate as  needed) Postural changes:  (Partially reclined if helpful) Oral care recommendations: Oral  care QID (4x/day);Oral care before ice chips/water Recommended consults: Consider Palliative care to establish GOC given frequent sleeping at home PTA, 20 pound weight loss, and worsening dysphagia     Rolena Infante, MS [facility] SLP Acute Rehab Services Office [phone]   Chales Abrahams 03/17/2023,4:36 PM

## 2023-03-18 ENCOUNTER — Inpatient Hospital Stay (HOSPITAL_COMMUNITY): Payer: PPO

## 2023-03-18 DIAGNOSIS — I480 Paroxysmal atrial fibrillation: Secondary | ICD-10-CM

## 2023-03-18 DIAGNOSIS — T17908A Unspecified foreign body in respiratory tract, part unspecified causing other injury, initial encounter: Secondary | ICD-10-CM

## 2023-03-18 DIAGNOSIS — J9601 Acute respiratory failure with hypoxia: Secondary | ICD-10-CM | POA: Diagnosis not present

## 2023-03-18 LAB — CBC
HCT: 44.4 % (ref 39.0–52.0)
Hemoglobin: 15.2 g/dL (ref 13.0–17.0)
MCH: 36.8 pg — ABNORMAL HIGH (ref 26.0–34.0)
MCHC: 34.2 g/dL (ref 30.0–36.0)
MCV: 107.5 fL — ABNORMAL HIGH (ref 80.0–100.0)
Platelets: 205 10*3/uL (ref 150–400)
RBC: 4.13 MIL/uL — ABNORMAL LOW (ref 4.22–5.81)
RDW: 13 % (ref 11.5–15.5)
WBC: 8.4 10*3/uL (ref 4.0–10.5)
nRBC: 0 % (ref 0.0–0.2)

## 2023-03-18 LAB — GLUCOSE, CAPILLARY
Glucose-Capillary: 124 mg/dL — ABNORMAL HIGH (ref 70–99)
Glucose-Capillary: 141 mg/dL — ABNORMAL HIGH (ref 70–99)
Glucose-Capillary: 144 mg/dL — ABNORMAL HIGH (ref 70–99)
Glucose-Capillary: 146 mg/dL — ABNORMAL HIGH (ref 70–99)
Glucose-Capillary: 152 mg/dL — ABNORMAL HIGH (ref 70–99)
Glucose-Capillary: 159 mg/dL — ABNORMAL HIGH (ref 70–99)
Glucose-Capillary: 163 mg/dL — ABNORMAL HIGH (ref 70–99)

## 2023-03-18 LAB — BASIC METABOLIC PANEL
Anion gap: 16 — ABNORMAL HIGH (ref 5–15)
BUN: 33 mg/dL — ABNORMAL HIGH (ref 8–23)
CO2: 25 mmol/L (ref 22–32)
Calcium: 8.5 mg/dL — ABNORMAL LOW (ref 8.9–10.3)
Chloride: 94 mmol/L — ABNORMAL LOW (ref 98–111)
Creatinine, Ser: 0.86 mg/dL (ref 0.61–1.24)
GFR, Estimated: >60 mL/min (ref >60)
Glucose, Bld: 287 mg/dL — ABNORMAL HIGH (ref 70–99)
Potassium: 3.5 mmol/L (ref 3.5–5.1)
Sodium: 135 mmol/L (ref 135–145)

## 2023-03-18 LAB — MAGNESIUM
Magnesium: 2.7 mg/dL — ABNORMAL HIGH (ref 1.7–2.4)
Magnesium: 2.6 mg/dL — ABNORMAL HIGH (ref 1.7–2.4)

## 2023-03-18 LAB — CULTURE, BLOOD (ROUTINE X 2)
Culture: NO GROWTH
Special Requests: ADEQUATE

## 2023-03-18 LAB — PHOSPHORUS
Phosphorus: 3.2 mg/dL (ref 2.5–4.6)
Phosphorus: 2.8 mg/dL (ref 2.5–4.6)

## 2023-03-18 MED ORDER — OSMOLITE 1.5 CAL PO LIQD
1000.0000 mL | ORAL | Status: DC
  Administered 2023-03-18: 1000 mL
  Filled 2023-03-18 (×3): qty 1000

## 2023-03-18 MED ORDER — MAGIC MOUTHWASH W/LIDOCAINE
5.0000 mL | Freq: Four times a day (QID) | ORAL | Status: DC | PRN
  Filled 2023-03-18 (×2): qty 5

## 2023-03-18 MED ORDER — PROSOURCE TF20 ENFIT COMPATIBL EN LIQD
60.0000 mL | Freq: Every day | ENTERAL | Status: DC
  Administered 2023-03-18: 60 mL
  Filled 2023-03-18 (×2): qty 60

## 2023-03-18 MED ORDER — HYDROXYZINE HCL 10 MG/5ML PO SYRP
25.0000 mg | ORAL_SOLUTION | Freq: Three times a day (TID) | ORAL | Status: DC | PRN
  Administered 2023-03-18 (×2): 25 mg
  Filled 2023-03-18 (×4): qty 12.5

## 2023-03-18 MED ORDER — HALOPERIDOL LACTATE 5 MG/ML IJ SOLN: 1.0000 mg | Freq: Four times a day (QID) | INTRAMUSCULAR | Status: DC | PRN

## 2023-03-18 MED ORDER — LORAZEPAM 2 MG/ML IJ SOLN
1.0000 mg | Freq: Four times a day (QID) | INTRAMUSCULAR | Status: DC | PRN
  Administered 2023-03-18 (×2): 1 mg via INTRAVENOUS
  Filled 2023-03-18 (×2): qty 1

## 2023-03-18 MED ORDER — LORAZEPAM 2 MG/ML IJ SOLN
1.0000 mg | INTRAMUSCULAR | Status: DC | PRN
  Administered 2023-03-18 – 2023-03-20 (×6): 2 mg via INTRAVENOUS
  Filled 2023-03-18 (×6): qty 1

## 2023-03-18 MED ORDER — AMIODARONE HCL 200 MG PO TABS
200.0000 mg | ORAL_TABLET | Freq: Every day | ORAL | Status: DC
  Administered 2023-03-18: 200 mg
  Filled 2023-03-18: qty 1

## 2023-03-18 NOTE — Progress Notes (Signed)
Nutrition Follow-up  DOCUMENTATION CODES:   Not applicable  INTERVENTION:  - Per MD, can start tube feeds today via SBFT. Osmolite 1.5 at 55 ml/h (1320 ml per day) *Start at 47mL/hr and advance by 10mL Q8H Prosource TF20 60 ml daily Provides 2060 kcal, 103 gm protein, 1006 ml free water daily   - Monitor magnesium, potassium, and phosphorus BID for at least 3 days, MD to replete as needed, as pt is at risk for refeeding syndrome.   - Monitor weight trends.  - Monitor BM, last BM noted to be PTA (>5 days ago).  - No bowel regimen currently ordered.  - Monitor weight trends.  NUTRITION DIAGNOSIS:   Inadequate oral intake related to inability to eat as evidenced by NPO status. *ongoing  GOAL:   Patient will meet greater than or equal to 90% of their needs *progressing, starting TF  MONITOR:   Vent status, Labs, Weight trends, TF tolerance  REASON FOR ASSESSMENT:   Consult Enteral/tube feeding initiation and management  ASSESSMENT:   77 year old male with PMH of upper esophageal stricture, depression, HTN, HLD who presented after choking on crackers. Admitted with acute hypoxic respiratory failure from aspiration pneumonitis.  9/1 Admit, Intubated  9/2 TF started at trickle 9/3 Extubated 9/4 SLP eval, rec'd NPO 9/5 MBS (rec'd NPO); SBFT placed by IR  Patient had SBFT placed yesterday under fluoro. Tip near the duodenojejunal junction per xray read.   Discussed patient with MD. Per MD, can start tube feeds today. Discussed plan with RN. Suspect patient may now be at risk for refeeding syndrome given inadequate nutrition since admission.   Admit weight: 190# Current weight: 194# I&O's:+0.7L + for edema  Medications reviewed and include: Lasix, Insulin  Labs reviewed:  Magnesium    Diet Order:   Diet Order             Diet NPO time specified  Diet effective now                   EDUCATION NEEDS:  Not appropriate for education at this  time  Skin:  Skin Assessment: Reviewed RN Assessment  Last BM:  PTA  Height:  Ht Readings from Last 1 Encounters:  03/14/23 5\' 8"  (1.727 m)   Weight:  Wt Readings from Last 1 Encounters:  03/18/23 88 kg   Ideal Body Weight:  70 kg  BMI:  Body mass index is 29.5 kg/m.  Estimated Nutritional Needs:  Kcal:  1900-2150 kcals Protein:  95-110 grams Fluid:  >/= 1.9L    Shelle Iron RD, LDN For contact information, refer to [facility].

## 2023-03-18 NOTE — TOC Initial Note (Addendum)
Transition of Care [facility]) - Initial/Assessment Note    Patient Details  Name: Scott Newman MRN: [id number] Date of Birth: 1946-03-04  Transition of Care [facility]) CM/SW Contact:    Lavenia Atlas, RN Phone Number: 03/18/2023, 6:12 PM  Clinical Narrative:   Per chart review TOC consult on file for HH/DME. Patient currently in [facility] ICU for acute hypoxemic respiratory failure. Patient currently DNR-comfort care. Palliative consulted, awaiting GOC discussion  TOC will follow for needs.                Expected Discharge Plan: Hospice Medical Facility Barriers to Discharge: Continued Medical Work up   Patient Goals and CMS Choice Patient states their goals for this hospitalization and ongoing recovery are:: hospice CMS Medicare.gov Compare Post Acute Care list provided to:: Patient Represenative (must comment) Choice offered to / list presented to : Adult Children [place] ownership interest in [facility].provided to:: Adult Children    Expected Discharge Plan and Services In-house Referral: Hospice / Palliative Care Discharge Planning Services: CM Consult Post Acute Care Choice: NA Living arrangements for the past 2 months: Single Family Home                 DME Arranged: N/A DME Agency: NA       HH Arranged: NA HH Agency: NA        Prior Living Arrangements/Services Living arrangements for the past 2 months: Single Family Home Lives with:: Spouse Patient language and need for interpreter reviewed:: Yes Do you feel safe going back to the place where you live?: Yes      Need for Family Participation in Patient Care: No (Comment) Care giver support system in place?: Yes (comment) Current home services: Other (comment) (none) Criminal Activity/Legal Involvement Pertinent to Current Situation/Hospitalization: No - Comment as needed  Activities of Daily Living Home Assistive Devices/Equipment: None ADL Screening (condition at time of admission) Patient's  cognitive ability adequate to safely complete daily activities?: Yes Is the patient deaf or have difficulty hearing?: No Does the patient have difficulty seeing, even when wearing glasses/contacts?: No Does the patient have difficulty concentrating, remembering, or making decisions?: No Patient able to express need for assistance with ADLs?: Yes Does the patient have difficulty dressing or bathing?: Yes Independently performs ADLs?: Yes (appropriate for developmental age) Does the patient have difficulty walking or climbing stairs?: No Weakness of Legs: None Weakness of Arms/Hands: None  Permission Sought/Granted Permission sought to share information with : Case Manager Permission granted to share information with : Yes, Verbal Permission Granted  Share Information with NAME: Case manager           Emotional Assessment Appearance:: Appears stated age Attitude/Demeanor/Rapport: Unable to Assess Affect (typically observed): Unable to Assess Orientation: :  (UTA) Alcohol / Substance Use: Other (comment) (former tobacco smoker) Psych Involvement: No (comment)  Admission diagnosis:  Acute respiratory failure with hypoxia (HCC) [J96.01] Aspiration into airway, initial encounter [T17.908A] Acute hypoxemic respiratory failure (HCC) [J96.01] Patient Active Problem List   Diagnosis Date Noted   Acute hypoxemic respiratory failure (HCC) 03/13/2023   PCP:  Alberteen Sam, MD Pharmacy:   [facility] [phone] - [place], [place] - [address] AT [address] OF [facility] [address] [place] [address] Phone: [phone] Fax: [phone]     Social Determinants of Health (SDOH) Social History: SDOH Screenings   Food Insecurity: Patient Unable To Answer (03/13/2023)  Housing: Patient Unable To Answer (03/13/2023)  Transportation Needs: Patient Unable  To Answer (03/13/2023)  Utilities: Patient Unable To Answer (03/13/2023)   SDOH Interventions:      Readmission Risk Interventions    03/18/2023    6:08 PM  Readmission Risk Prevention Plan  Post Dischage Appt Complete  Medication Screening Complete  Transportation Screening Complete

## 2023-03-18 NOTE — Consult Note (Signed)
Referring Provider: Dr. Blake Newman Primary Care Physician:  Scott Sam, MD Primary Gastroenterologist:  Dr. Levora Newman  Reason for Consultation:  Dysphagia  HPI: Scott Newman is a 78 y.o. male with history of a proximal esophageal stricture seen on barium swallow in 2019 and reportedly seen on EGD in 2023 (cannot locate records in Epic or outpt chart). S/P esophageal dilation by ENT in 06/2018. Wife reports that he has been tolerating solid food but has occasional "coughing spells" when he eats. He felt choked after eating crackers on 8/31 and was intubated and treated for aspiration pneumonia. Currently extubated and receiving Ativan prn due to delirium. Patient unable to follow commands. Speech path report reviewed. Wife outside room in tears.   PMH: HTN, HLD, CHF, History of alcohol abuse, history of prostate cancer  PSH: hernia surgery, right colectomy, right knee cyst removal  Prior to Admission medications   Medication Sig Start Date End Date Taking? Authorizing Provider  lisinopril (ZESTRIL) 10 MG tablet Take 10 mg by mouth daily. 12/29/22  Yes [provider]  SYNTHROID 50 MCG tablet Take 50 mcg by mouth daily. 12/30/22  Yes [provider]    Scheduled Meds:  amiodarone  200 mg Per Tube Daily   Chlorhexidine Gluconate Cloth  6 each Topical Q0600   feeding supplement (PROSource TF20)  60 mL Per Tube Daily   furosemide  40 mg Intravenous Daily   insulin aspart  0-15 Units Subcutaneous Q4H   levothyroxine  50 mcg Per Tube Daily   pantoprazole (PROTONIX) IV  40 mg Intravenous Q24H   sodium chloride flush  10-40 mL Intracatheter Q12H   Continuous Infusions:  sodium chloride Stopped (03/17/23 1430)   feeding supplement (OSMOLITE 1.5 CAL) 1,000 mL (03/18/23 1316)   PRN Meds:.acetaminophen, albuterol, hydrALAZINE, hydrOXYzine, labetalol, LORazepam, magic mouthwash w/lidocaine, ondansetron (ZOFRAN) IV, mouth rinse, mouth rinse, polyethylene glycol, sodium  chloride flush  Allergies as of 03/12/2023   (Not on File)    No family history on file.  Social History   Socioeconomic History   Marital status: Married    Spouse name: Not on file   Number of children: Not on file   Years of education: Not on file   Highest education level: Not on file  Occupational History   Not on file  Tobacco Use   Smoking status: Not on file   Smokeless tobacco: Not on file  Substance and Sexual Activity   Alcohol use: Not on file   Drug use: Not on file   Sexual activity: Not on file  Other Topics Concern   Not on file  Social History Narrative   Not on file   Social Determinants of Health   Financial Resource Strain: Not on file  Food Insecurity: Patient Unable To Answer (03/13/2023)   Hunger Vital Sign    Worried About Running Out of Food in the Last Year: Patient unable to answer    Ran Out of Food in the Last Year: Patient unable to answer  Transportation Needs: Patient Unable To Answer (03/13/2023)   PRAPARE - Transportation    Lack of Transportation (Medical): Patient unable to answer    Lack of Transportation (Non-Medical): Patient unable to answer  Physical Activity: Not on file  Stress: Not on file  Social Connections: Not on file  Intimate Partner Violence: Patient Unable To Answer (03/13/2023)   Humiliation, Afraid, Rape, and Kick questionnaire    Fear of Current or Ex-Partner: Patient unable to answer  Emotionally Abused: Patient unable to answer    Physically Abused: Patient unable to answer    Sexually Abused: Patient unable to answer    Review of Systems: All negative except as stated above in HPI.  Physical Exam: Vital signs: Vitals:   03/18/23 1200 03/18/23 1300  BP: (!) 181/54 (!) 161/67  Pulse: 70 66  Resp: (!) 22 (!) 22  Temp:    SpO2: 94% 95%  T 97.9  Last BM Date :  (PTA) General:   Somnolent, chronically ill-appearing, elderly, frail, mild acute distress, well-nourished  Head: normocephalic,  atraumatic Eyes: anicteric sclera ENT: oropharynx clear Neck: supple, nontender Lungs:  Clear throughout to auscultation.   No wheezes, crackles, or rhonchi. No acute distress. Heart:  Regular rate and rhythm; no murmurs, clicks, rubs,  or gallops. Abdomen: distended, nontender (no facial grimace), +BS  Rectal:  Deferred Ext: no edema  GI:  Lab Results: Recent Labs    03/16/23 0527 03/17/23 0426 03/18/23 0453  WBC 12.8* 10.4 8.4  HGB 15.0 14.6 15.2  HCT 44.0 43.0 44.4  PLT 144* 158 205   BMET Recent Labs    03/16/23 0527 03/17/23 0426 03/18/23 0453  NA 133* 136 135  K 3.5 3.7 3.5  CL 96* 97* 94*  CO2 25 25 25   GLUCOSE 176* 211* 287*  BUN 21 32* 33*  CREATININE 0.78 0.90 0.86  CALCIUM 8.5* 8.8* 8.5*   LFT No results for input(s): "PROT", "ALBUMIN", "AST", "ALT", "ALKPHOS", "BILITOT", "BILIDIR", "IBILI" in the last 72 hours. PT/INR No results for input(s): "LABPROT", "INR" in the last 72 hours.   Studies/Results: DG Swallowing Func-Speech Pathology  Result Date: 03/17/2023 Modified Barium Swallow Study Patient Details Name: Scott Newman MRN: [id number] Date of Birth: [birth year] Today's Date: 03/17/2023 HPI/PMH: HPI: Pt adm to [facility] after respiratory failure - he felt choked in the evening of 8/31 while eating crackers.  He was brought to ER and SpO2 in 70's, and chest xray showed bilateral infiltrates.  He required intubation in ER.  PCCM consulted to manage in ICU.  Sepsis from pna.  Former smokier, Esophageal stricture, Hyponatremia, IDA, Aortic stenosis, Cataract, HFpEF, Colon polyp, Depression, Gait disturbance, GERD, GI bleeding, ETOH, HTN, HLD, Hypothyroidism, Prostate cancer, Salmonella.  He was intubated 8/31-03/15/2023.  Swallow eval ordered.  He developed AF RVR vs SVT the day following extubation.  He has ben requiring precedex and was agitated while on the vent.  Pt also with prior pons chronic micro ischemia/atrophy per prior imaging 10/2014. Clinical Impression:  Factors that may increase risk of adverse event in presence of aspiration Scott Newman & Scott Newman 2021): Factors that may increase risk of adverse event in presence of aspiration Scott Newman & Scott Newman 2021): Reduced saliva; Frail or deconditioned; Limited mobility; Reduced cognitive function; Weak cough; Dependence for feeding and/or oral hygiene; Poor general health and/or compromised immunity; Respiratory or GI disease; Inadequate oral hygiene Recommendations/Plan: Swallowing Evaluation Recommendations Swallowing Evaluation Recommendations Recommendations: Ice chips PRN after oral care (Critical care service has ordered NG to be placed) Liquid Administration via: -- (Moisture via Toothette) Medication Administration: Via alternative means Supervision: Full supervision/cueing for swallowing strategies Swallowing strategies  : Multiple dry swallows after each bite/sip; Slow rate (Cues to cough and expectorate as needed) Postural changes: -- (Partially reclined if helpful) Oral care recommendations: Oral care QID (4x/day); Oral care before ice chips/water Recommended consults: Consider Palliative care Treatment Plan Treatment Plan Treatment recommendations: Therapy as outlined in treatment plan below Follow-up recommendations: Skilled nursing-short term rehab (<  3 hours/day) Functional status assessment: Patient has had a recent decline in their functional status and demonstrates the ability to make significant improvements in function in a reasonable and predictable amount of time. Treatment frequency: Min 2x/week Treatment duration: 2 weeks Interventions: Aspiration precaution training; Compensatory techniques; Patient/family education; Trials of upgraded texture/liquids; Respiratory muscle strength training; Oropharyngeal exercises Recommendations Recommendations for follow up therapy are one component of a multi-disciplinary discharge planning process, led by the attending physician.  Recommendations may be updated based on  patient status, additional functional criteria and insurance authorization. Assessment: Orofacial Exam: Orofacial Exam Oral Cavity - Dentition: Adequate natural dentition Anatomy: Anatomy: Suspected cervical osteophytes; Prominent cricopharyngeus Boluses Administered: Boluses Administered Boluses Administered: Thin liquids (Level 0); Mildly thick liquids (Level 2, nectar thick); Moderately thick liquids (Level 3, honey thick)  Oral Impairment Domain: Oral Impairment Domain Lip Closure: No labial escape Tongue control during bolus hold: Posterior escape of less than half of bolus  Pharyngeal Impairment Domain: Pharyngeal Impairment Domain Soft palate elevation: No bolus between soft palate (SP)/pharyngeal wall (PW) Laryngeal elevation: Complete superior movement of thyroid cartilage with complete approximation of arytenoids to epiglottic petiole Anterior hyoid excursion: Partial anterior movement Epiglottic movement: Partial inversion Laryngeal vestibule closure: Complete, no air/contrast in laryngeal vestibule Pharyngeal stripping wave : Present - diminished Pharyngeal contraction (A/P view only): N/A Pharyngoesophageal segment opening: Partial distention/partial duration, partial obstruction of flow Tongue base retraction: Narrow column of contrast or air between tongue base and PPW Pharyngeal residue: Collection of residue within or on pharyngeal structures Location of pharyngeal residue: Tongue base; Valleculae; Pharyngeal wall; Pyriform sinuses; Aryepiglottic folds  Esophageal Impairment Domain: No data recorded Pill: Pill Consistency administered: -- (DNT) Penetration/Aspiration Scale Score: Penetration/Aspiration Scale Score 1.  Material does not enter airway: Moderately thick liquids (Level 3, honey thick) 5.  Material enters airway, CONTACTS cords and not ejected out: Thin liquids (Level 0); Mildly thick liquids (Level 2, nectar thick) Compensatory Strategies: Compensatory Strategies Compensatory strategies:  Yes Effortful swallow: Ineffective Ineffective Effortful Swallow: Mildly thick liquid (Level 2, nectar thick); Moderately thick liquid (Level 3, honey thick); Thin liquid (Level 0) Multiple swallows: Effective Effective Multiple Swallows: Mildly thick liquid (Level 2, nectar thick); Moderately thick liquid (Level 3, honey thick); Thin liquid (Level 0) (partially) Reclining posture: Effective Effective Reclining Posture: Mildly thick liquid (Level 2, nectar thick); Moderately thick liquid (Level 3, honey thick); Thin liquid (Level 0) (partially effective)   General Information: Caregiver present: No  Diet Prior to this Study: NPO   Temperature : Normal   Respiratory Status: WFL   Supplemental O2: Nasal cannula   History of Recent Intubation: Yes  Behavior/Cognition: Lethargic/Drowsy Self-Feeding Abilities: Dependent for feeding Baseline vocal quality/speech: Hypophonia/low volume Volitional Cough: Able to elicit Volitional Swallow: Unable to elicit Exam Limitations: Limited visibility; Fatigue Goal Planning: Prognosis for improved oropharyngeal function: Fair Barriers to Reach Goals: Time post onset; Severity of deficits No data recorded Patient/Family Stated Goal: to get better and not have to have a feeding tube Consulted and agree with results and recommendations: Family member/caregiver (Wife) Pain: Pain Assessment Breathing: 1 Negative Vocalization: 1 Facial Expression: 0 Body Language: 1 Consolability: 0 PAINAD Score: 3 Facial Expression: 0 Body Movements: 2 Muscle Tension: 1 Compliance with ventilator (intubated pts.): N/A Vocalization (extubated pts.): 1 CPOT Total: 4 End of Session: Start Time:SLP Start Time (ACUTE ONLY): 1500 Stop Time: SLP Stop Time (ACUTE ONLY): 1525 Time Calculation:SLP Time Calculation (min) (ACUTE ONLY): 25 min Charges: SLP Evaluations $ SLP Speech Visit: 1  Visit SLP Evaluations $BSS Swallow: 1 Procedure $MBS Swallow: 1 Procedure $Swallowing Treatment: 1 Procedure SLP visit diagnosis:  SLP Visit Diagnosis: Dysphagia, oropharyngeal phase (R13.12) Patient presents with moderate oropharyngeal dysphagia with sensorimotor impairments in addition to known h/o proximal esophageal narrowing requiring dilatation.   Oral deficits characterized by impaired lingual propulsive strength and coordination resulting in premature spillage of barium into pharynx and oral retention.   Pharyngeal dysphagia marked impaired muscular contraction impacting tongue base retraction and adequacy of laryngeal elevation and closure.  Partial approximation of arytenoids to epiglottic petiole allows penetration of liquids during swallow without adequate Scott despite cued cough.  Retention of secretions in pharynx noted that mixed with barium without full Scott.  Patient without reported sensation to pharyngeal retention but does conduct intermittent dry swallows which helps to diminish residue but not fully clear.  Reclining patient in chair appeared to marginally aid in maintaining barium residual in the posterior pharynx.     Wiiliam was participative during evaluation but required total cues to close his lips and swallow and he became more fatigued with increased blood pressure and work of breathing than his baseline after just a few boluses.  SLP ceased study as patient is to get an NG placed under fluoroscopy - SLP did not want severe barium retention in pharynx with significantly elevated aspiration risk with tube placement. PA is present to facilitate this placement.  Suboptimal view due to patient's shoulders partially blocking view.  Did not pan to patient's esophagus due to limits from patient's gross deconditioning, excessive lines.   A Et this time recommend continue single small ice chips and oral moisture to decrease disuse muscle atrophy.  Patient will benefit from aggressive SLP to help mitigate patient's dysphagia via swallowing exercises, RN ST and p.o. trials.  Educated wife thoroughly to  recommendations as SLP walked her back to ICU to the patient's room.  Will follow-up for dysphagia treatment.  Hopeful for improvement with patient's strengthening from nutrition as well as with active therapy. Factors that may increase risk of adverse event in presence of aspiration Scott Newman & Scott Newman 2021): Reduced saliva;Frail or deconditioned;Limited mobility;Reduced cognitive function;Weak cough;Dependence for feeding and/or oral hygiene;Poor general health and/or compromised immunity;Respiratory or GI disease;Inadequate oral hygiene : Chales Abrahams 03/17/2023, 4:41 PM  DG Basil Dess Tube Plc W/Fl W/Rad  Result Date: 03/17/2023 INDICATION: Esophageal stricture with inadequate oral intake. Request for Dobbhoff placement. EXAM: NASO G TUBE PLACEMENT WITH FL AND WITH RAD COMPARISON:  None. CONTRAST:  10 cc Omnipaque 180 FLUOROSCOPY TIME:  Radiation Exposure Index (if provided by fluoroscopic device): 20.80 mGy air Kerma COMPLICATIONS: None immediate PROCEDURE: The patient was placed supine on the fluoroscopy table and the right nostril was prepped with viscous lidocaine. The Dobbhoff tube was also lubricated with viscous lidocaine and inserted into the right nostril without difficulty. Under intermittent fluoroscopic guidance, the Dobbhoff tube was advanced into the esophagus. Initial advancement resulted into advancement into the airway. The Dobhoff tube was removed and a subsequent attempt was made. Under intermittent fluoroscopic guidance, the Dobhoff tube was advanced into the esophagus, through the stomach, and through the duodenum with tip ultimately terminating over the expected location of the duodenal - jejunal junction. A spot fluoroscopic image was saved for documentation purposes. The tube was flushed easily with 10 cc normal saline and affixed to the patient's nose with tape. The patient tolerated the procedure well without immediate postprocedural complication. FINDINGS: Dobbhoff placement tip  terminating near the distal duodenum. IMPRESSION: Successful  fluoroscopic guided placement of Dobbhoff tube with tip terminating near the duodenojejunal junction. The tube is ready for immediate use. Unsuccessful initial placement attempt by Mina Marble, PA-C Subsequent successful placement performed by Dr Richarda Overlie Electronically Signed   By: Richarda Overlie M.D.   On: 03/17/2023 16:42    Impression/Plan: Aspiration pneumonia with concern for continued PO intake. Would not recommend an EGD to evaluate for a stricture with his current mental status. If his mental status improves, can consider repeat speech path evaluation or barium swallow next week. Previous stricture was proximal and was dilated by ENT. Dr. Marca Ancona available this weekend if needed otherwise [name] will re-visit on Monday September 9. Supportive care.     LOS: 5 days   Shirley Friar  03/18/2023, 2:14 PM  Questions please call [phone]

## 2023-03-18 NOTE — Progress Notes (Signed)
Wife spoke with Dr. Blake Divine about changing patient code status. Wife wants patient to be comfort care and to be transitioned to hospice house tomorrow. She doesn't want him to suffer and or to receive any more tests/procedures. Palliative consulted today.

## 2023-03-18 NOTE — Progress Notes (Signed)
SLP Cancellation Note  Patient Details Name: Scott Newman MRN: [id number] DOB: Sep 18, 1945   Cancelled treatment:       Reason Eval/Treat Not Completed: Other (comment) (pt too lethargic for po intake at this time)  Rolena Infante, MS [facility] SLP Acute Rehab Services Office [phone]  Chales Abrahams 03/18/2023, 1:11 PM

## 2023-03-18 NOTE — Plan of Care (Signed)
  Problem: Clinical Measurements: Goal: Will remain free from infection Outcome: Progressing   Problem: Education: Goal: Knowledge of General Education information will improve Description: Including pain rating scale, medication(s)/side effects and non-pharmacologic comfort measures Outcome: Not Progressing   Problem: Clinical Measurements: Goal: Respiratory complications will improve Outcome: Not Progressing   Problem: Nutrition: Goal: Adequate nutrition will be maintained Outcome: Not Progressing   Problem: Coping: Goal: Level of anxiety will decrease Outcome: Not Progressing   Problem: Pain Managment: Goal: General experience of comfort will improve Outcome: Not Progressing   Problem: Metabolic: Goal: Ability to maintain appropriate glucose levels will improve Outcome: Not Progressing   Problem: Nutritional: Goal: Maintenance of adequate nutrition will improve Outcome: Not Progressing

## 2023-03-18 NOTE — Progress Notes (Signed)
Pt got off restraints and pulled NGT partially out. This RN saw that NGT is coiled around pt throat. NGT removed. NP Virgel Manifold notified. Will continue to monitor pt.

## 2023-03-18 NOTE — Plan of Care (Signed)
  Problem: Education: Goal: Knowledge of General Education information will improve Description: Including pain rating scale, medication(s)/side effects and non-pharmacologic comfort measures Outcome: Progressing   Problem: Health Behavior/Discharge Planning: Goal: Ability to manage health-related needs will improve Outcome: Progressing   Problem: Clinical Measurements: Goal: Ability to maintain clinical measurements within normal limits will improve Outcome: Progressing Goal: Will remain free from infection Outcome: Progressing Goal: Diagnostic test results will improve Outcome: Progressing Goal: Respiratory complications will improve Outcome: Progressing Goal: Cardiovascular complication will be avoided Outcome: Progressing   Problem: Elimination: Goal: Will not experience complications related to bowel motility Outcome: Progressing Goal: Will not experience complications related to urinary retention Outcome: Progressing   Problem: Pain Managment: Goal: General experience of comfort will improve Outcome: Progressing   Problem: Education: Goal: Ability to describe self-care measures that may prevent or decrease complications (Diabetes Survival Skills Education) will improve Outcome: Progressing Goal: Individualized Educational Video(s) Outcome: Progressing   Problem: Fluid Volume: Goal: Ability to maintain a balanced intake and output will improve Outcome: Progressing   Problem: Health Behavior/Discharge Planning: Goal: Ability to identify and utilize available resources and services will improve Outcome: Progressing Goal: Ability to manage health-related needs will improve Outcome: Progressing   Problem: Metabolic: Goal: Ability to maintain appropriate glucose levels will improve Outcome: Progressing   Problem: Nutritional: Goal: Maintenance of adequate nutrition will improve Outcome: Progressing Goal: Progress toward achieving an optimal weight will  improve Outcome: Progressing   Problem: Skin Integrity: Goal: Risk for impaired skin integrity will decrease Outcome: Progressing   Problem: Tissue Perfusion: Goal: Adequacy of tissue perfusion will improve Outcome: Progressing   Problem: Activity: Goal: Risk for activity intolerance will decrease Outcome: Not Progressing   Problem: Nutrition: Goal: Adequate nutrition will be maintained Outcome: Not Progressing   Problem: Coping: Goal: Level of anxiety will decrease Outcome: Not Progressing   Problem: Safety: Goal: Ability to remain free from injury will improve Outcome: Not Progressing   Problem: Skin Integrity: Goal: Risk for impaired skin integrity will decrease Outcome: Not Progressing   Problem: Safety: Goal: Non-violent Restraint(s) Outcome: Not Progressing   Problem: Coping: Goal: Ability to adjust to condition or change in health will improve Outcome: Not Progressing

## 2023-03-18 NOTE — Progress Notes (Signed)
eLink Physician-Brief Progress Note Patient Name: Scott Newman DOB: 1945-08-03 MRN: [id number]   Date of Service  03/18/2023  HPI/Events of Note  77 year old male initially presented with esophageal stricture and aspiration requiring intubation.  He got extubated 9/3 he continues to have ongoing respiratory distress .  Experiencing agitated delirium.  Patient has been so agitated that he has been moving his pelvis and eventually broke his Foley catheter.  It was removed.  He has ongoing hematuria.  eICU Interventions  Will replace the Foley catheter  Add hydroxyzine through the tube for ongoing agitation and anxiety.  Patient has QT prolongation and benzos may exacerbate his underlying delirium.     Intervention Category Minor Interventions: Agitation / anxiety - evaluation and management  Saniyah Mondesir 03/18/2023, 4:54 AM

## 2023-03-18 NOTE — Plan of Care (Signed)
  Problem: Clinical Measurements: Goal: Will remain free from infection Outcome: Progressing   Problem: Education: Goal: Knowledge of General Education information will improve Description: Including pain rating scale, medication(s)/side effects and non-pharmacologic comfort measures 03/18/2023 0721 by Mikki Harbor, RN Outcome: Not Progressing 03/18/2023 0717 by Mikki Harbor, RN Outcome: Not Progressing   Problem: Clinical Measurements: Goal: Respiratory complications will improve 03/18/2023 0721 by Mikki Harbor, RN Outcome: Not Progressing 03/18/2023 0717 by Mikki Harbor, RN Outcome: Not Progressing Goal: Cardiovascular complication will be avoided Outcome: Not Progressing   Problem: Activity: Goal: Risk for activity intolerance will decrease Outcome: Not Progressing   Problem: Nutrition: Goal: Adequate nutrition will be maintained 03/18/2023 0721 by Mikki Harbor, RN Outcome: Not Progressing 03/18/2023 0717 by Mikki Harbor, RN Outcome: Not Progressing   Problem: Coping: Goal: Level of anxiety will decrease 03/18/2023 0721 by Mikki Harbor, RN Outcome: Not Progressing 03/18/2023 0717 by Mikki Harbor, RN Outcome: Not Progressing   Problem: Pain Managment: Goal: General experience of comfort will improve 03/18/2023 0721 by Mikki Harbor, RN Outcome: Not Progressing 03/18/2023 0717 by Mikki Harbor, RN Outcome: Not Progressing   Problem: Metabolic: Goal: Ability to maintain appropriate glucose levels will improve Outcome: Not Progressing   Problem: Nutritional: Goal: Maintenance of adequate nutrition will improve 03/18/2023 0721 by Mikki Harbor, RN Outcome: Not Progressing 03/18/2023 0717 by Mikki Harbor, RN Outcome: Not Progressing

## 2023-03-18 NOTE — Progress Notes (Signed)
[facility]                                                                               Scott Newman, is a 77 y.o. male, DOB - 1946/04/26, NWG:[id number] Admit date - 03/12/2023    Outpatient Primary MD for the patient is Danford, Earl Lites, MD  LOS - 5  days    Brief summary    77 yo male former smoker with hx of esophageal stricture who was scheduled for EGD with [name]. He was advised to have this done in the hospital. He eats saltine crackers daily to keep his sodium level up. He felt choked in the evening of 8/31 while eating crackers. He was brought to ER and SpO2 in 70's, and chest xray showed bilateral infiltrates. He required intubation in ER. PCCM consulted to manage in ICU.  Significant Events: 8/31 Admit, intubated, start ABx 9/01 elevated troponin >> start heparin and levophed gtt 9/3 extubated 9/4 ongoing delirium and agitation limiting progression. AF-RVR amiodarone started. 9/6 transfer to [facility]    Assessment & Plan    Assessment and Plan:  Acute respiratory failure with hypoxia after choking episode on crackers at home and aspiration pneumonia.  Patient currently on 4 to 5 L of oxygen. Completed 5 days of IV Unasyn. Patient has low threshold for intubation    Acute metabolic encephalopathy from sepsis from aspiration pneumonia/ICU delirium Patient has been weaned off Precedex on 9 /4, and transferred to [facility] on 9/6. Patient has been agitated and is in 4 point restraints since this morning Started him on IV Ativan 1 to 2 mg every 4 hours as needed for agitation. Unable to give him Haldol for prolonged QTc.   Prolonged Qtc Monitor.    Atrial fibrillation with RVR/SVT post extubation correlated with agitation On amiodarone drip transition to oral amiodarone today, and possibly dc in the next 24 hours.  Esophageal stricture Will request gastroenterology consult to see if he is a candidate for dilation. Dysphagia Speech therapy is  on board, failed MBS currently has a cortrack and tube feeds to be started later today.   Severe aortic stenosis In the setting of her chronic diastolic heart failure NSTEMI Elevated troponins probably from demand ischemia from sepsis.    Hypertensive urgency Suspect from agitation As needed hydralazine ordered.   History of hypothyroidism Continue with Synthroid    Hematuria Resolved with cessation of heparin gtt.   RN Pressure Injury Documentation:    Malnutrition Type:  Nutrition Problem: Inadequate oral intake Etiology: inability to eat   Malnutrition Characteristics:  Signs/Symptoms: NPO status   Nutrition Interventions:  Interventions: Refer to RD note for recommendations, Tube feeding  Estimated body mass index is 29.5 kg/m as calculated from the following:   Height as of this encounter: 5\' 8"  (1.727 m).   Weight as of this encounter: 88 kg.  Code Status: FULL CODE.  DVT Prophylaxis:  SCDs Start: 03/13/23 0304   Level of Care: Level of care: ICU Family Communication: none at bedside.   Disposition Plan:     Remains inpatient appropriate:  IV amiodarone.   Procedures:  Echocardiogram. Severe aortic stenosis. Left ventricular ejection  fraction, by estimation, is 50 to 55%. The  left ventricle has low normal function. The left ventricle has no regional  wall motion abnormalities. There is mild left ventricular hypertrophy. Left ventricular diastolic  parameters are indeterminate   Consultants:   PCCM  Antimicrobials:   Anti-infectives (From admission, onward)    Start     Dose/Rate Route Frequency Ordered Stop   03/13/23 0800  ampicillin-sulbactam (UNASYN) 1.5 g in sodium chloride 0.9 % 100 mL IVPB  Status:  Discontinued        1.5 g 200 mL/hr over 30 Minutes Intravenous Every 6 hours 03/13/23 0340 03/13/23 0646   03/13/23 0800  Ampicillin-Sulbactam (UNASYN) 3 g in sodium chloride 0.9 % 100 mL IVPB  Status:  Discontinued        3 g 200 mL/hr  over 30 Minutes Intravenous Every 6 hours 03/13/23 0646 03/17/23 1452   03/12/23 2230  metroNIDAZOLE (FLAGYL) IVPB 500 mg        500 mg 100 mL/hr over 60 Minutes Intravenous  Once 03/12/23 2226 03/13/23 0221   03/12/23 2230  levofloxacin (LEVAQUIN) IVPB 750 mg        750 mg 100 mL/hr over 90 Minutes Intravenous  Once 03/12/23 2226 03/13/23 0048        Medications  Scheduled Meds:  amiodarone  200 mg Per Tube Daily   Chlorhexidine Gluconate Cloth  6 each Topical Q0600   feeding supplement (PROSource TF20)  60 mL Per Tube Daily   furosemide  40 mg Intravenous Daily   insulin aspart  0-15 Units Subcutaneous Q4H   levothyroxine  50 mcg Per Tube Daily   pantoprazole (PROTONIX) IV  40 mg Intravenous Q24H   sodium chloride flush  10-40 mL Intracatheter Q12H   Continuous Infusions:  sodium chloride Stopped (03/17/23 1430)   feeding supplement (OSMOLITE 1.5 CAL)     PRN Meds:.acetaminophen, albuterol, hydrALAZINE, hydrOXYzine, labetalol, LORazepam, magic mouthwash w/lidocaine, ondansetron (ZOFRAN) IV, mouth rinse, mouth rinse, polyethylene glycol, sodium chloride flush    Subjective:   Scott Newman was seen and examined today.    Objective:   Vitals:   03/18/23 0921 03/18/23 1000 03/18/23 1100 03/18/23 1200  BP: (!) 161/45 (!) 168/63 (!) 168/57 (!) 181/54  Pulse: 62 70 74 70  Resp: 17 (!) 24 (!) 27 (!) 22  Temp:      TempSrc:      SpO2: 96% 93% 95% 94%  Weight:      Height:        Intake/Output Summary (Last 24 hours) at 03/18/2023 1249 Last data filed at 03/18/2023 1200 Gross per 24 hour  Intake 559.28 ml  Output 2895 ml  Net -2335.72 ml   Filed Weights   03/16/23 0701 03/17/23 0447 03/18/23 0500  Weight: 88.2 kg 89.9 kg 88 kg     Exam General exam: ill appearing gentleman, in mild distress from agitation. NG tube in place.  Respiratory system:  on 4 lit of [place] oxygen. Diminished air entry at bases.  Cardiovascular system: S1 & S2 heard, regular,   Gastrointestinal system: Abdomen is nondistended, soft and nontender.  Central nervous system: sedated.  Extremities: no edema.  Skin:  flushed face Psychiatry: agitated and in restraints.     Data Reviewed:  I have personally reviewed following labs and imaging studies   CBC Lab Results  Component Value Date   WBC 8.4 03/18/2023   RBC 4.13 (L) 03/18/2023   HGB 15.2 03/18/2023   HCT 44.4 03/18/2023  MCV 107.5 (H) 03/18/2023   MCH 36.8 (H) 03/18/2023   PLT 205 03/18/2023   MCHC 34.2 03/18/2023   RDW 13.0 03/18/2023   LYMPHSABS 3.3 03/12/2023   MONOABS 1.2 (H) 03/12/2023   EOSABS 0.5 03/12/2023   BASOSABS 0.2 (H) 03/12/2023     Last metabolic panel Lab Results  Component Value Date   NA 135 03/18/2023   K 3.5 03/18/2023   CL 94 (L) 03/18/2023   CO2 25 03/18/2023   BUN 33 (H) 03/18/2023   CREATININE 0.86 03/18/2023   GLUCOSE 287 (H) 03/18/2023   GFRNONAA >60 03/18/2023   CALCIUM 8.5 (L) 03/18/2023   PHOS 3.2 03/18/2023   PROT 6.3 (L) 03/14/2023   ALBUMIN 3.2 (L) 03/14/2023   BILITOT 1.5 (H) 03/14/2023   ALKPHOS 57 03/14/2023   AST 51 (H) 03/14/2023   ALT 40 03/14/2023   ANIONGAP 16 (H) 03/18/2023    CBG (last 3)  Recent Labs    03/18/23 0407 03/18/23 0747 03/18/23 1238  GLUCAP 163* 141* 146*      Coagulation Profile: Recent Labs  Lab 03/13/23 1222  INR 1.2     Radiology Studies: DG Swallowing Func-Speech Pathology  Result Date: 03/17/2023 Modified Barium Swallow Study Patient Details Name: Scott Newman MRN: [id number] Date of Birth: 1945/10/04 Today's Date: 03/17/2023 HPI/PMH: HPI: Pt adm to [facility] after respiratory failure - he felt choked in the evening of 8/31 while eating crackers.  He was brought to ER and SpO2 in 70's, and chest xray showed bilateral infiltrates.  He required intubation in ER.  PCCM consulted to manage in ICU.  Sepsis from pna.  Former smokier, Esophageal stricture, Hyponatremia, IDA, Aortic stenosis, Cataract, HFpEF, Colon  polyp, Depression, Gait disturbance, GERD, GI bleeding, ETOH, HTN, HLD, Hypothyroidism, Prostate cancer, Salmonella.  He was intubated 8/31-03/15/2023.  Swallow eval ordered.  He developed AF RVR vs SVT the day following extubation.  He has ben requiring precedex and was agitated while on the vent.  Pt also with prior pons chronic micro ischemia/atrophy per prior imaging 10/2014. Clinical Impression: Factors that may increase risk of adverse event in presence of aspiration Scott Newman & Scott Newman 2021): Factors that may increase risk of adverse event in presence of aspiration Scott Newman & Scott Newman 2021): Reduced saliva; Frail or deconditioned; Limited mobility; Reduced cognitive function; Weak cough; Dependence for feeding and/or oral hygiene; Poor general health and/or compromised immunity; Respiratory or GI disease; Inadequate oral hygiene Recommendations/Plan: Swallowing Evaluation Recommendations Swallowing Evaluation Recommendations Recommendations: Ice chips PRN after oral care (Critical care service has ordered NG to be placed) Liquid Administration via: -- (Moisture via Toothette) Medication Administration: Via alternative means Supervision: Full supervision/cueing for swallowing strategies Swallowing strategies  : Multiple dry swallows after each bite/sip; Slow rate (Cues to cough and expectorate as needed) Postural changes: -- (Partially reclined if helpful) Oral care recommendations: Oral care QID (4x/day); Oral care before ice chips/water Recommended consults: Consider Palliative care Treatment Plan Treatment Plan Treatment recommendations: Therapy as outlined in treatment plan below Follow-up recommendations: Skilled nursing-short term rehab (<3 hours/day) Functional status assessment: Patient has had a recent decline in their functional status and demonstrates the ability to make significant improvements in function in a reasonable and predictable amount of time. Treatment frequency: Min 2x/week Treatment duration:  2 weeks Interventions: Aspiration precaution training; Compensatory techniques; Patient/family education; Trials of upgraded texture/liquids; Respiratory muscle strength training; Oropharyngeal exercises Recommendations Recommendations for follow up therapy are one component of a multi-disciplinary discharge planning process, led by the attending  physician.  Recommendations may be updated based on patient status, additional functional criteria and insurance authorization. Assessment: Orofacial Exam: Orofacial Exam Oral Cavity - Dentition: Adequate natural dentition Anatomy: Anatomy: Suspected cervical osteophytes; Prominent cricopharyngeus Boluses Administered: Boluses Administered Boluses Administered: Thin liquids (Level 0); Mildly thick liquids (Level 2, nectar thick); Moderately thick liquids (Level 3, honey thick)  Oral Impairment Domain: Oral Impairment Domain Lip Closure: No labial escape Tongue control during bolus hold: Posterior escape of less than half of bolus  Pharyngeal Impairment Domain: Pharyngeal Impairment Domain Soft palate elevation: No bolus between soft palate (SP)/pharyngeal wall (PW) Laryngeal elevation: Complete superior movement of thyroid cartilage with complete approximation of arytenoids to epiglottic petiole Anterior hyoid excursion: Partial anterior movement Epiglottic movement: Partial inversion Laryngeal vestibule closure: Complete, no air/contrast in laryngeal vestibule Pharyngeal stripping wave : Present - diminished Pharyngeal contraction (A/P view only): N/A Pharyngoesophageal segment opening: Partial distention/partial duration, partial obstruction of flow Tongue base retraction: Narrow column of contrast or air between tongue base and PPW Pharyngeal residue: Collection of residue within or on pharyngeal structures Location of pharyngeal residue: Tongue base; Valleculae; Pharyngeal wall; Pyriform sinuses; Aryepiglottic folds  Esophageal Impairment Domain: No data recorded Pill:  Pill Consistency administered: -- (DNT) Penetration/Aspiration Scale Score: Penetration/Aspiration Scale Score 1.  Material does not enter airway: Moderately thick liquids (Level 3, honey thick) 5.  Material enters airway, CONTACTS cords and not ejected out: Thin liquids (Level 0); Mildly thick liquids (Level 2, nectar thick) Compensatory Strategies: Compensatory Strategies Compensatory strategies: Yes Effortful swallow: Ineffective Ineffective Effortful Swallow: Mildly thick liquid (Level 2, nectar thick); Moderately thick liquid (Level 3, honey thick); Thin liquid (Level 0) Multiple swallows: Effective Effective Multiple Swallows: Mildly thick liquid (Level 2, nectar thick); Moderately thick liquid (Level 3, honey thick); Thin liquid (Level 0) (partially) Reclining posture: Effective Effective Reclining Posture: Mildly thick liquid (Level 2, nectar thick); Moderately thick liquid (Level 3, honey thick); Thin liquid (Level 0) (partially effective)   General Information: Caregiver present: No  Diet Prior to this Study: NPO   Temperature : Normal   Respiratory Status: WFL   Supplemental O2: Nasal cannula   History of Recent Intubation: Yes  Behavior/Cognition: Lethargic/Drowsy Self-Feeding Abilities: Dependent for feeding Baseline vocal quality/speech: Hypophonia/low volume Volitional Cough: Able to elicit Volitional Swallow: Unable to elicit Exam Limitations: Limited visibility; Fatigue Goal Planning: Prognosis for improved oropharyngeal function: Fair Barriers to Reach Goals: Time post onset; Severity of deficits No data recorded Patient/Family Stated Goal: to get better and not have to have a feeding tube Consulted and agree with results and recommendations: Family member/caregiver (Wife) Pain: Pain Assessment Breathing: 1 Negative Vocalization: 1 Facial Expression: 0 Body Language: 1 Consolability: 0 PAINAD Score: 3 Facial Expression: 0 Body Movements: 2 Muscle Tension: 1 Compliance with ventilator (intubated  pts.): N/A Vocalization (extubated pts.): 1 CPOT Total: 4 End of Session: Start Time:SLP Start Time (ACUTE ONLY): 1500 Stop Time: SLP Stop Time (ACUTE ONLY): 1525 Time Calculation:SLP Time Calculation (min) (ACUTE ONLY): 25 min Charges: SLP Evaluations $ SLP Speech Visit: 1 Visit SLP Evaluations $BSS Swallow: 1 Procedure $MBS Swallow: 1 Procedure $Swallowing Treatment: 1 Procedure SLP visit diagnosis: SLP Visit Diagnosis: Dysphagia, oropharyngeal phase (R13.12) Patient presents with moderate oropharyngeal dysphagia with sensorimotor impairments in addition to known h/o proximal esophageal narrowing requiring dilatation.   Oral deficits characterized by impaired lingual propulsive strength and coordination resulting in premature spillage of barium into pharynx and oral retention.   Pharyngeal dysphagia marked impaired muscular contraction impacting tongue base  retraction and adequacy of laryngeal elevation and closure.  Partial approximation of arytenoids to epiglottic petiole allows penetration of liquids during swallow without adequate Scott despite cued cough.  Retention of secretions in pharynx noted that mixed with barium without full Scott.  Patient without reported sensation to pharyngeal retention but does conduct intermittent dry swallows which helps to diminish residue but not fully clear.  Reclining patient in chair appeared to marginally aid in maintaining barium residual in the posterior pharynx.     Scott Newman was participative during evaluation but required total cues to close his lips and swallow and he became more fatigued with increased blood pressure and work of breathing than his baseline after just a few boluses.  SLP ceased study as patient is to get an NG placed under fluoroscopy - SLP did not want severe barium retention in pharynx with significantly elevated aspiration risk with tube placement. PA is present to facilitate this placement.  Suboptimal view due to patient's shoulders  partially blocking view.  Did not pan to patient's esophagus due to limits from patient's gross deconditioning, excessive lines.   A Et this time recommend continue single small ice chips and oral moisture to decrease disuse muscle atrophy.  Patient will benefit from aggressive SLP to help mitigate patient's dysphagia via swallowing exercises, RN ST and p.o. trials.  Educated wife thoroughly to recommendations as SLP walked her back to ICU to the patient's room.  Will follow-up for dysphagia treatment.  Hopeful for improvement with patient's strengthening from nutrition as well as with active therapy. Factors that may increase risk of adverse event in presence of aspiration Scott Newman & Scott Newman 2021): Reduced saliva;Frail or deconditioned;Limited mobility;Reduced cognitive function;Weak cough;Dependence for feeding and/or oral hygiene;Poor general health and/or compromised immunity;Respiratory or GI disease;Inadequate oral hygiene : Chales Abrahams 03/17/2023, 4:41 PM  DG Basil Dess Tube Plc W/Fl W/Rad  Result Date: 03/17/2023 INDICATION: Esophageal stricture with inadequate oral intake. Request for Dobbhoff placement. EXAM: NASO G TUBE PLACEMENT WITH FL AND WITH RAD COMPARISON:  None. CONTRAST:  10 cc Omnipaque 180 FLUOROSCOPY TIME:  Radiation Exposure Index (if provided by fluoroscopic device): 20.80 mGy air Kerma COMPLICATIONS: None immediate PROCEDURE: The patient was placed supine on the fluoroscopy table and the right nostril was prepped with viscous lidocaine. The Dobbhoff tube was also lubricated with viscous lidocaine and inserted into the right nostril without difficulty. Under intermittent fluoroscopic guidance, the Dobbhoff tube was advanced into the esophagus. Initial advancement resulted into advancement into the airway. The Dobhoff tube was removed and a subsequent attempt was made. Under intermittent fluoroscopic guidance, the Dobhoff tube was advanced into the esophagus, through the stomach, and  through the duodenum with tip ultimately terminating over the expected location of the duodenal - jejunal junction. A spot fluoroscopic image was saved for documentation purposes. The tube was flushed easily with 10 cc normal saline and affixed to the patient's nose with tape. The patient tolerated the procedure well without immediate postprocedural complication. FINDINGS: Dobbhoff placement tip terminating near the distal duodenum. IMPRESSION: Successful fluoroscopic guided placement of Dobbhoff tube with tip terminating near the duodenojejunal junction. The tube is ready for immediate use. Unsuccessful initial placement attempt by Mina Marble, PA-C Subsequent successful placement performed by Dr Richarda Overlie Electronically Signed   By: Richarda Overlie M.D.   On: 03/17/2023 16:42       Kathlen Mody M.D. [facility] 03/18/2023, 12:49 PM  Available via Epic secure chat 7am-7pm After 7 pm, please refer to night coverage  provider listed on amion.

## 2023-03-19 ENCOUNTER — Encounter (HOSPITAL_COMMUNITY): Payer: Self-pay | Admitting: Pulmonary Disease

## 2023-03-19 ENCOUNTER — Other Ambulatory Visit: Payer: Self-pay

## 2023-03-19 DIAGNOSIS — J9601 Acute respiratory failure with hypoxia: Secondary | ICD-10-CM | POA: Diagnosis not present

## 2023-03-19 LAB — BASIC METABOLIC PANEL
Anion gap: 12 (ref 5–15)
BUN: 30 mg/dL — ABNORMAL HIGH (ref 8–23)
CO2: 29 mmol/L (ref 22–32)
Calcium: 8.8 mg/dL — ABNORMAL LOW (ref 8.9–10.3)
Chloride: 100 mmol/L (ref 98–111)
Creatinine, Ser: 0.76 mg/dL (ref 0.61–1.24)
GFR, Estimated: >60 mL/min (ref >60)
Glucose, Bld: 133 mg/dL — ABNORMAL HIGH (ref 70–99)
Potassium: 3 mmol/L — ABNORMAL LOW (ref 3.5–5.1)
Sodium: 141 mmol/L (ref 135–145)

## 2023-03-19 LAB — CBC
HCT: 46.7 % (ref 39.0–52.0)
Hemoglobin: 15.8 g/dL (ref 13.0–17.0)
MCH: 36.7 pg — ABNORMAL HIGH (ref 26.0–34.0)
MCHC: 33.8 g/dL (ref 30.0–36.0)
MCV: 108.4 fL — ABNORMAL HIGH (ref 80.0–100.0)
Platelets: 219 10*3/uL (ref 150–400)
RBC: 4.31 MIL/uL (ref 4.22–5.81)
RDW: 12.9 % (ref 11.5–15.5)
WBC: 16.9 10*3/uL — ABNORMAL HIGH (ref 4.0–10.5)
nRBC: 0 % (ref 0.0–0.2)

## 2023-03-19 LAB — GLUCOSE, CAPILLARY
Glucose-Capillary: 116 mg/dL — ABNORMAL HIGH (ref 70–99)
Glucose-Capillary: 132 mg/dL — ABNORMAL HIGH (ref 70–99)
Glucose-Capillary: 119 mg/dL — ABNORMAL HIGH (ref 70–99)
Glucose-Capillary: 129 mg/dL — ABNORMAL HIGH (ref 70–99)
Glucose-Capillary: 148 mg/dL — ABNORMAL HIGH (ref 70–99)

## 2023-03-19 LAB — MAGNESIUM: Magnesium: 2.5 mg/dL — ABNORMAL HIGH (ref 1.7–2.4)

## 2023-03-19 LAB — PHOSPHORUS: Phosphorus: 3 mg/dL (ref 2.5–4.6)

## 2023-03-19 NOTE — Plan of Care (Signed)
  Problem: Coping: Goal: Level of anxiety will decrease Outcome: Progressing   Problem: Safety: Goal: Ability to remain free from injury will improve Outcome: Progressing   Problem: Skin Integrity: Goal: Risk for impaired skin integrity will decrease Outcome: Progressing   

## 2023-03-19 NOTE — Progress Notes (Signed)
[facility]) Hospital Liaison Note  Received request from Transitions of Care Manager Darlin Priestly for family interest in [facility]. Spoke with [name] to confirm interest and explain services.  Approval for [employer] is determined by [facility] MD. Once [facility] MD has determined [facility] eligibility, [facility] will update hospital staff and family.  Please do not hesitate to call with any hospice related questions.    Thank you for the opportunity to participate in this patient's care.  Eugenie Birks, MSW [facility] Liaison  [phone]

## 2023-03-19 NOTE — Plan of Care (Signed)
  Problem: Clinical Measurements: Goal: Will remain free from infection Outcome: Progressing Goal: Diagnostic test results will improve Outcome: Progressing   Problem: Education: Goal: Knowledge of General Education information will improve Description: Including pain rating scale, medication(s)/side effects and non-pharmacologic comfort measures Outcome: Not Progressing   Problem: Clinical Measurements: Goal: Ability to maintain clinical measurements within normal limits will improve Outcome: Not Progressing Goal: Respiratory complications will improve Outcome: Not Progressing   Problem: Activity: Goal: Risk for activity intolerance will decrease Outcome: Not Progressing   Problem: Nutrition: Goal: Adequate nutrition will be maintained Outcome: Not Progressing

## 2023-03-19 NOTE — Consult Note (Signed)
Consultation Note Date: 03/19/2023   Patient Name: Scott Newman  DOB: 1945-07-18  MRN: [id number]  Age / Sex: 77 y.o., male  PCP: Maryfrances Bunnell Earl Lites, MD Referring Physician: Kathlen Mody, MD  Reason for Consultation: Establishing goals of care  HPI/Patient Profile: 77 y.o. male  admitted on 03/12/2023    Clinical Assessment and Goals of Care:  77 year old gentleman, history of former smoking, at home with his wife, history of esophageal stricture.  Patient was scheduled for EGD with [name].  Patient eats saltine crackers and was noted to have had an event of aspiration and got choked up in the evening of 03-12-2023 while eating crackers Patient was brought into the emergency department, at that time his oxygen saturations were in the 70s, chest x-ray showed bilateral infiltrates. He actually required intubation in the emergency department and was initially managed by pulmonary critical care medicine in the ICU.  On 03-12-2023 he was intubated and started on antibiotics.  He was found to have elevated troponins for which heparin and Levophed drips were utilized.  On 9-3 he was extubated however he has had ongoing delirium and agitation limiting his progression.  Hospital course also complicated by A-fib with RVR. NG tube was placed and a trial of tube feeds was being done. However patient pulled out NG tube. Patient's wife had discussions with the the medical team and elected for DNR-comfort.  She wishes to avoid any further procedures or tests and wishes to focus on comfort-focused care. Palliative consult continues. Patient seen and examined.  Bedside nursing colleague also present in the room.  Patient not awake, not alert, moans intermittently, appears to be in mild distress. Palliative medicine is specialized medical care for people living with serious illness. It focuses on providing relief from the  symptoms and stress of a serious illness. The goal is to improve quality of life for both the patient and the family. Goals of care: Broad aims of medical therapy in relation to the patient's values and preferences. Our aim is to provide medical care aimed at enabling patients to achieve the goals that matter most to them, given the circumstances of their particular medical situation and their constraints.   This Clinical research associate corresponded with the patient's wife as well as with the nursing colleague from the patient's room.  Patient's wife reiterated about scope for continuing comfort-focused care and avoiding any further pain and suffering. Will correspond with TOC as well as hospice colleagues with regards to pursuing residential hospice facility and with regards to continuing comfort-focused care.  NEXT OF KIN Wife  SUMMARY OF RECOMMENDATIONS   DNR DNI Comfort care No further tests or procedures No artificial feeding - do not re insert feeding tube that the patient pulled out last night.  TOC consult Explore the option of residential hospice for symptom management and continuation of comfort-focused care.  Thank you for the consult.   Code Status/Advance Care Planning: DNR   Symptom Management:     Palliative Prophylaxis:  Delirium Protocol  Additional Recommendations (  Limitations, Scope, Preferences): Full Comfort Care  Psycho-social/Spiritual:  Desire for further Chaplaincy support:yes Additional Recommendations: Education on Hospice  Prognosis:  Hours - Days  Discharge Planning: Hospice facility      Primary Diagnoses: Present on Admission:  Acute hypoxemic respiratory failure (HCC)   I have reviewed the medical record, interviewed the patient and family, and examined the patient. The following aspects are pertinent.  History reviewed. No pertinent past medical history. Social History   Socioeconomic History   Marital status: Married    Spouse name: Not on file    Number of children: Not on file   Years of education: Not on file   Highest education level: Not on file  Occupational History   Not on file  Tobacco Use   Smoking status: Former    Types: Cigarettes   Smokeless tobacco: Not on file  Substance and Sexual Activity   Alcohol use: Not on file   Drug use: Not on file   Sexual activity: Not on file  Other Topics Concern   Not on file  Social History Narrative   Not on file   Social Determinants of Health   Financial Resource Strain: Not on file  Food Insecurity: Patient Unable To Answer (03/13/2023)   Hunger Vital Sign    Worried About Running Out of Food in the Last Year: Patient unable to answer    Ran Out of Food in the Last Year: Patient unable to answer  Transportation Needs: Patient Unable To Answer (03/13/2023)   PRAPARE - Transportation    Lack of Transportation (Medical): Patient unable to answer    Lack of Transportation (Non-Medical): Patient unable to answer  Physical Activity: Not on file  Stress: Not on file  Social Connections: Not on file   History reviewed. No pertinent family history. Scheduled Meds:  amiodarone  200 mg Per Tube Daily   Chlorhexidine Gluconate Cloth  6 each Topical Q0600   feeding supplement (PROSource TF20)  60 mL Per Tube Daily   furosemide  40 mg Intravenous Daily   insulin aspart  0-15 Units Subcutaneous Q4H   levothyroxine  50 mcg Per Tube Daily   pantoprazole (PROTONIX) IV  40 mg Intravenous Q24H   sodium chloride flush  10-40 mL Intracatheter Q12H   Continuous Infusions:  sodium chloride 250 mL (03/18/23 1602)   feeding supplement (OSMOLITE 1.5 CAL) Stopped (03/18/23 2332)   PRN Meds:.acetaminophen, albuterol, hydrALAZINE, hydrOXYzine, labetalol, LORazepam, magic mouthwash w/lidocaine, ondansetron (ZOFRAN) IV, mouth rinse, mouth rinse, polyethylene glycol, sodium chloride flush Medications Prior to Admission:  Prior to Admission medications   Medication Sig Start Date End Date  Taking? Authorizing Provider  lisinopril (ZESTRIL) 10 MG tablet Take 10 mg by mouth daily. 12/29/22  Yes [provider]  SYNTHROID 50 MCG tablet Take 50 mcg by mouth daily. 12/30/22  Yes [provider]   Allergies  Allergen Reactions   Penicillins Rash   Review of Systems Appears confused does not verbalize Physical Exam Chronically ill-appearing Agitated at times Pulled out NG tube Diminished breath sounds Not awake Does not have nonverbal gestures of distress or discomfort evident Has some wincing and grimacing going on  Vital Signs: BP (!) 169/69 (BP Location: Left Arm)   Pulse 83   Temp 98 F (36.7 C) (Oral)   Resp 15   Ht 5\' 8"  (1.727 m)   Wt 89.5 kg   SpO2 93%   BMI 30.00 kg/m  Pain Scale: PAINAD   Pain Score: 0-No pain  SpO2: SpO2: 93 % O2 Device:SpO2: 93 % O2 Flow Rate: .O2 Flow Rate (L/min): 4 L/min  IO: Intake/output summary:  Intake/Output Summary (Last 24 hours) at 03/19/2023 0929 Last data filed at 03/19/2023 0455 Gross per 24 hour  Intake 204.1 ml  Output 2670 ml  Net -2465.9 ml    LBM: Last BM Date : 03/19/23 Baseline Weight: Weight: 86.2 kg Most recent weight: Weight: 89.5 kg     Palliative Assessment/Data:   PPS 30%  Time In:  10 Time Out:  11 Time Total:  60  Greater than 50%  of this time was spent counseling and coordinating care related to the above assessment and plan.  Signed by: Rosalin Hawking, MD   Please contact Palliative Medicine Team phone at [phone] for questions and concerns.  For individual provider: See Loretha Stapler

## 2023-03-19 NOTE — Discharge Summary (Signed)
Physician Discharge Summary   Patient: Scott Newman MRN: [id number] DOB: 04/15/1946  Admit date:     03/12/2023  Discharge date: 03/19/23  Discharge Physician: Kathlen Mody   PCP: Alberteen Sam, MD   Recommendations at discharge:  Please follow up with Hospice MD as recommended.   Discharge Diagnoses: Principal Problem:   Acute hypoxemic respiratory failure [facility])    Hospital Course:  77 yo male former smoker with hx of esophageal stricture who was scheduled for EGD with [name]. He was advised to have this done in the hospital. He eats saltine crackers daily to keep his sodium level up. He felt choked in the evening of 8/31 while eating crackers. He was brought to ER and SpO2 in 70's, and chest xray showed bilateral infiltrates. He required intubation in ER. PCCM consulted to manage in ICU.  Significant Events: 8/31 Admit, intubated, start ABx 9/01 elevated troponin >> start heparin and levophed gtt 9/3 extubated 9/4 ongoing delirium and agitation limiting progression. AF-RVR amiodarone started. 9/6 transfer to [facility]    Assessment and Plan:  Acute respiratory failure with hypoxia after choking episode on crackers at home and aspiration pneumonia Completed 5 days of IV Unasyn. Continues to do poorly, wife at bedside wanted to transition to comfort care without any further tests or procedures.  Planning for residential hospice.        Acute metabolic encephalopathy from sepsis from aspiration pneumonia/ICU delirium Patient has been weaned off Precedex on 9 /4, and transferred to [facility] on 9/6. Patient has been agitated and has been in restraints. Wife requested to transition to comfort measures.    Prolonged Qtc    Atrial fibrillation with RVR/SVT post extubation  Rate controlled.    Esophageal stricture Failed MBS, pulled out cortrack twice.  Transitioned to comfort measures      Severe aortic stenosis In the setting of her chronic diastolic heart  failure NSTEMI Elevated troponins probably from demand ischemia from sepsis.       Hypertensive urgency       History of hypothyroidism        Hematuria Resolved with cessation of heparin gtt.    Consultants: palliative  PCCM Procedures performed: none.  Disposition: Hospice care Diet recommendation:  Discharge Diet Orders (From admission, onward)     Start     Ordered   03/19/23 0000  Diet - low sodium heart healthy        03/19/23 1216            DISCHARGE MEDICATION: Allergies as of 03/19/2023       Reactions   Penicillins Rash        Medication List     STOP taking these medications    lisinopril 10 MG tablet Commonly known as: ZESTRIL   Synthroid 50 MCG tablet Generic drug: levothyroxine        Discharge Exam: Filed Weights   03/17/23 0447 03/18/23 0500 03/19/23 0500  Weight: 89.9 kg 88 kg 89.5 kg   General exam: ill appearing gentleman  Respiratory system: coarse breath sounds.  Cardiovascular system: S1 & S2 heard, RRR.  Gastrointestinal system: Abdomen is nondistended, soft and nontender.   Condition at discharge: stable  The results of significant diagnostics from this hospitalization (including imaging, microbiology, ancillary and laboratory) are listed below for reference.   Imaging Studies: DG Swallowing Func-Speech Pathology  Result Date: 03/17/2023 Modified Barium Swallow Study Patient Details Name: Scott Newman MRN: [id number] Date of Birth: Mar 13, 1946 Today's Date:  03/17/2023 HPI/PMH: HPI: Pt adm to [facility] after respiratory failure - he felt choked in the evening of 8/31 while eating crackers.  He was brought to ER and SpO2 in 70's, and chest xray showed bilateral infiltrates.  He required intubation in ER.  PCCM consulted to manage in ICU.  Sepsis from pna.  Former smokier, Esophageal stricture, Hyponatremia, IDA, Aortic stenosis, Cataract, HFpEF, Colon polyp, Depression, Gait disturbance, GERD, GI bleeding, ETOH, HTN, HLD,  Hypothyroidism, Prostate cancer, Salmonella.  He was intubated 8/31-03/15/2023.  Swallow eval ordered.  He developed AF RVR vs SVT the day following extubation.  He has ben requiring precedex and was agitated while on the vent.  Pt also with prior pons chronic micro ischemia/atrophy per prior imaging 10/2014. Clinical Impression: Factors that may increase risk of adverse event in presence of aspiration Scott Newman & Scott Newman 2021): Factors that may increase risk of adverse event in presence of aspiration Scott Newman & Scott Newman 2021): Reduced saliva; Frail or deconditioned; Limited mobility; Reduced cognitive function; Weak cough; Dependence for feeding and/or oral hygiene; Poor general health and/or compromised immunity; Respiratory or GI disease; Inadequate oral hygiene Recommendations/Plan: Swallowing Evaluation Recommendations Swallowing Evaluation Recommendations Recommendations: Ice chips PRN after oral care (Critical care service has ordered NG to be placed) Liquid Administration via: -- (Moisture via Toothette) Medication Administration: Via alternative means Supervision: Full supervision/cueing for swallowing strategies Swallowing strategies  : Multiple dry swallows after each bite/sip; Slow rate (Cues to cough and expectorate as needed) Postural changes: -- (Partially reclined if helpful) Oral care recommendations: Oral care QID (4x/day); Oral care before ice chips/water Recommended consults: Consider Palliative care Treatment Plan Treatment Plan Treatment recommendations: Therapy as outlined in treatment plan below Follow-up recommendations: Skilled nursing-short term rehab (<3 hours/day) Functional status assessment: Patient has had a recent decline in their functional status and demonstrates the ability to make significant improvements in function in a reasonable and predictable amount of time. Treatment frequency: Min 2x/week Treatment duration: 2 weeks Interventions: Aspiration precaution training; Compensatory  techniques; Patient/family education; Trials of upgraded texture/liquids; Respiratory muscle strength training; Oropharyngeal exercises Recommendations Recommendations for follow up therapy are one component of a multi-disciplinary discharge planning process, led by the attending physician.  Recommendations may be updated based on patient status, additional functional criteria and insurance authorization. Assessment: Orofacial Exam: Orofacial Exam Oral Cavity - Dentition: Adequate natural dentition Anatomy: Anatomy: Suspected cervical osteophytes; Prominent cricopharyngeus Boluses Administered: Boluses Administered Boluses Administered: Thin liquids (Level 0); Mildly thick liquids (Level 2, nectar thick); Moderately thick liquids (Level 3, honey thick)  Oral Impairment Domain: Oral Impairment Domain Lip Closure: No labial escape Tongue control during bolus hold: Posterior escape of less than half of bolus  Pharyngeal Impairment Domain: Pharyngeal Impairment Domain Soft palate elevation: No bolus between soft palate (SP)/pharyngeal wall (PW) Laryngeal elevation: Complete superior movement of thyroid cartilage with complete approximation of arytenoids to epiglottic petiole Anterior hyoid excursion: Partial anterior movement Epiglottic movement: Partial inversion Laryngeal vestibule closure: Complete, no air/contrast in laryngeal vestibule Pharyngeal stripping wave : Present - diminished Pharyngeal contraction (A/P view only): N/A Pharyngoesophageal segment opening: Partial distention/partial duration, partial obstruction of flow Tongue base retraction: Narrow column of contrast or air between tongue base and PPW Pharyngeal residue: Collection of residue within or on pharyngeal structures Location of pharyngeal residue: Tongue base; Valleculae; Pharyngeal wall; Pyriform sinuses; Aryepiglottic folds  Esophageal Impairment Domain: No data recorded Pill: Pill Consistency administered: -- (DNT) Penetration/Aspiration Scale  Score: Penetration/Aspiration Scale Score 1.  Material does not enter airway: Moderately  thick liquids (Level 3, honey thick) 5.  Material enters airway, CONTACTS cords and not ejected out: Thin liquids (Level 0); Mildly thick liquids (Level 2, nectar thick) Compensatory Strategies: Compensatory Strategies Compensatory strategies: Yes Effortful swallow: Ineffective Ineffective Effortful Swallow: Mildly thick liquid (Level 2, nectar thick); Moderately thick liquid (Level 3, honey thick); Thin liquid (Level 0) Multiple swallows: Effective Effective Multiple Swallows: Mildly thick liquid (Level 2, nectar thick); Moderately thick liquid (Level 3, honey thick); Thin liquid (Level 0) (partially) Reclining posture: Effective Effective Reclining Posture: Mildly thick liquid (Level 2, nectar thick); Moderately thick liquid (Level 3, honey thick); Thin liquid (Level 0) (partially effective)   General Information: Caregiver present: No  Diet Prior to this Study: NPO   Temperature : Normal   Respiratory Status: WFL   Supplemental O2: Nasal cannula   History of Recent Intubation: Yes  Behavior/Cognition: Lethargic/Drowsy Self-Feeding Abilities: Dependent for feeding Baseline vocal quality/speech: Hypophonia/low volume Volitional Cough: Able to elicit Volitional Swallow: Unable to elicit Exam Limitations: Limited visibility; Fatigue Goal Planning: Prognosis for improved oropharyngeal function: Fair Barriers to Reach Goals: Time post onset; Severity of deficits No data recorded Patient/Family Stated Goal: to get better and not have to have a feeding tube Consulted and agree with results and recommendations: Family member/caregiver (Wife) Pain: Pain Assessment Breathing: 1 Negative Vocalization: 1 Facial Expression: 0 Body Language: 1 Consolability: 0 PAINAD Score: 3 Facial Expression: 0 Body Movements: 2 Muscle Tension: 1 Compliance with ventilator (intubated pts.): N/A Vocalization (extubated pts.): 1 CPOT Total: 4 End of  Session: Start Time:SLP Start Time (ACUTE ONLY): 1500 Stop Time: SLP Stop Time (ACUTE ONLY): 1525 Time Calculation:SLP Time Calculation (min) (ACUTE ONLY): 25 min Charges: SLP Evaluations $ SLP Speech Visit: 1 Visit SLP Evaluations $BSS Swallow: 1 Procedure $MBS Swallow: 1 Procedure $Swallowing Treatment: 1 Procedure SLP visit diagnosis: SLP Visit Diagnosis: Dysphagia, oropharyngeal phase (R13.12) Patient presents with moderate oropharyngeal dysphagia with sensorimotor impairments in addition to known h/o proximal esophageal narrowing requiring dilatation.   Oral deficits characterized by impaired lingual propulsive strength and coordination resulting in premature spillage of barium into pharynx and oral retention.   Pharyngeal dysphagia marked impaired muscular contraction impacting tongue base retraction and adequacy of laryngeal elevation and closure.  Partial approximation of arytenoids to epiglottic petiole allows penetration of liquids during swallow without adequate Scott despite cued cough.  Retention of secretions in pharynx noted that mixed with barium without full Scott.  Patient without reported sensation to pharyngeal retention but does conduct intermittent dry swallows which helps to diminish residue but not fully clear.  Reclining patient in chair appeared to marginally aid in maintaining barium residual in the posterior pharynx.     Scott Newman was participative during evaluation but required total cues to close his lips and swallow and he became more fatigued with increased blood pressure and work of breathing than his baseline after just a few boluses.  SLP ceased study as patient is to get an NG placed under fluoroscopy - SLP did not want severe barium retention in pharynx with significantly elevated aspiration risk with tube placement. PA is present to facilitate this placement.  Suboptimal view due to patient's shoulders partially blocking view.  Did not pan to patient's esophagus due to  limits from patient's gross deconditioning, excessive lines.   A Et this time recommend continue single small ice chips and oral moisture to decrease disuse muscle atrophy.  Patient will benefit from aggressive SLP to help mitigate patient's dysphagia via swallowing exercises, RN ST  and p.o. trials.  Educated wife thoroughly to recommendations as SLP walked her back to ICU to the patient's room.  Will follow-up for dysphagia treatment.  Hopeful for improvement with patient's strengthening from nutrition as well as with active therapy. Factors that may increase risk of adverse event in presence of aspiration Scott Newman & Scott Newman 2021): Reduced saliva;Frail or deconditioned;Limited mobility;Reduced cognitive function;Weak cough;Dependence for feeding and/or oral hygiene;Poor general health and/or compromised immunity;Respiratory or GI disease;Inadequate oral hygiene : Chales Abrahams 03/17/2023, 4:41 PM  DG [employer] Dess Tube [employer] W/Fl W/Rad  Result Date: 03/17/2023 INDICATION: Esophageal stricture with inadequate oral intake. Request for Dobbhoff placement. EXAM: NASO G TUBE PLACEMENT WITH FL AND WITH RAD COMPARISON:  None. CONTRAST:  10 cc Omnipaque 180 FLUOROSCOPY TIME:  Radiation Exposure Index (if provided by fluoroscopic device): 20.80 mGy air Kerma COMPLICATIONS: None immediate PROCEDURE: The patient was placed supine on the fluoroscopy table and the right nostril was prepped with viscous lidocaine. The Dobbhoff tube was also lubricated with viscous lidocaine and inserted into the right nostril without difficulty. Under intermittent fluoroscopic guidance, the Dobbhoff tube was advanced into the esophagus. Initial advancement resulted into advancement into the airway. The Dobhoff tube was removed and a subsequent attempt was made. Under intermittent fluoroscopic guidance, the Dobhoff tube was advanced into the esophagus, through the stomach, and through the duodenum with tip ultimately terminating over the expected  location of the duodenal - jejunal junction. A spot fluoroscopic image was saved for documentation purposes. The tube was flushed easily with 10 cc normal saline and affixed to the patient's nose with tape. The patient tolerated the procedure well without immediate postprocedural complication. FINDINGS: Dobbhoff placement tip terminating near the distal duodenum. IMPRESSION: Successful fluoroscopic guided placement of Dobbhoff tube with tip terminating near the duodenojejunal junction. The tube is ready for immediate use. Unsuccessful initial placement attempt by Mina Marble, PA-C Subsequent successful placement performed by Dr Richarda Overlie Electronically Signed   By: Richarda Overlie M.D.   On: 03/17/2023 16:42   [place] EKG SITE RITE  Result Date: 03/15/2023 If Site Rite image not attached, placement could not be confirmed due to current cardiac rhythm.  DG Chest Port 1 View  Result Date: 03/15/2023 CLINICAL DATA:  Respiratory failure. EXAM: PORTABLE CHEST 1 VIEW COMPARISON:  Chest x-ray dated March 12, 2023. FINDINGS: The patient is rotated to the right. The endotracheal tube has been advanced in the interval, with the tip now just beyond the carina in the proximal right mainstem bronchus. New enteric tube entering the stomach with the tip below the field of view. Stable cardiomediastinal silhouette. Worsening hazy density overlying both mid and lower lungs. No pneumothorax. No acute osseous abnormality. IMPRESSION: 1. Endotracheal tube tip just beyond the carina in the proximal right mainstem bronchus. Recommend retraction. 2. Worsening bilateral pleural effusions and atelectasis. These results will be called to the ordering clinician or representative by the Radiologist Assistant, and communication documented in the PACS or [employer]. Electronically Signed   By: Obie Dredge M.D.   On: 03/15/2023 09:23   ECHOCARDIOGRAM COMPLETE  Result Date: 03/13/2023    ECHOCARDIOGRAM REPORT   Patient Name:   Scott Newman Date of Exam: 03/13/2023 Medical Rec #:  [id number]        Height:       68.0 in Accession #:    [id number]       Weight:       191.6 lb Date of Birth:  16-May-1946  BSA:          2.007 m Patient Age:    76 years         BP:           159/61 mmHg Patient Gender: M                HR:           59 bpm. Exam Location:  Inpatient Procedure: 2D Echo, Cardiac Doppler and Color Doppler Indications:    Elevated Troponin  History:        Patient has prior history of Echocardiogram examinations, most                 recent 01/06/2023. CHF; Risk Factors:Hypertension and                 Dyslipidemia.  Sonographer:    Milbert Coulter Referring Phys: [id number] BRADLEY L ICARD IMPRESSIONS  1. Severe aortic stenosis.  2. Left ventricular ejection fraction, by estimation, is 50 to 55%. The left ventricle has low normal function. The left ventricle has no regional wall motion abnormalities. There is mild left ventricular hypertrophy. Left ventricular diastolic parameters are indeterminate.  3. Right ventricular systolic function is normal. The right ventricular size is normal. There is mildly elevated pulmonary artery systolic pressure. The estimated right ventricular systolic pressure is 41.4 mmHg.  4. Left atrial size was severely dilated.  5. Right atrial size was mildly dilated.  6. The mitral valve is normal in structure. Mild mitral valve regurgitation. No evidence of mitral stenosis. Severe mitral annular calcification.  7. DI: 0.17. The aortic valve is calcified. There is severe calcifcation of the aortic valve. There is severe thickening of the aortic valve. Aortic valve regurgitation is moderate. Severe aortic valve stenosis. Aortic valve area, by VTI measures 0.54 cm. Aortic valve mean gradient measures 43.5 mmHg. Aortic valve Vmax measures 4.24 m/s.  8. The inferior vena cava is dilated in size with <50% respiratory variability, suggesting right atrial pressure of 15 mmHg. FINDINGS  Left Ventricle: Left ventricular  ejection fraction, by estimation, is 50 to 55%. The left ventricle has low normal function. The left ventricle has no regional wall motion abnormalities. The left ventricular internal cavity size was normal in size. There is mild left ventricular hypertrophy. Left ventricular diastolic parameters are indeterminate. Right Ventricle: The right ventricular size is normal. No increase in right ventricular wall thickness. Right ventricular systolic function is normal. There is mildly elevated pulmonary artery systolic pressure. The tricuspid regurgitant velocity is 2.57  m/s, and with an assumed right atrial pressure of 15 mmHg, the estimated right ventricular systolic pressure is 41.4 mmHg. Left Atrium: Left atrial size was severely dilated. Right Atrium: Right atrial size was mildly dilated. Pericardium: There is no evidence of pericardial effusion. Mitral Valve: The mitral valve is normal in structure. There is moderate thickening of the mitral valve leaflet(s). There is moderate calcification of the mitral valve leaflet(s). Severe mitral annular calcification. Mild mitral valve regurgitation. No evidence of mitral valve stenosis. Tricuspid Valve: The tricuspid valve is normal in structure. Tricuspid valve regurgitation is not demonstrated. No evidence of tricuspid stenosis. Aortic Valve: DI: 0.17. The aortic valve is calcified. There is severe calcifcation of the aortic valve. There is severe thickening of the aortic valve. Aortic valve regurgitation is moderate. Aortic regurgitation PHT measures 333 msec. Severe aortic stenosis is present. Aortic valve mean gradient measures 43.5 mmHg. Aortic valve peak gradient measures 72.1 mmHg. Aortic valve  area, by VTI measures 0.54 cm. Pulmonic Valve: The pulmonic valve was normal in structure. Pulmonic valve regurgitation is not visualized. No evidence of pulmonic stenosis. Aorta: The aortic root is normal in size and structure. Venous: The inferior vena cava is dilated in  size with less than 50% respiratory variability, suggesting right atrial pressure of 15 mmHg. IAS/Shunts: No atrial level shunt detected by color flow Doppler. Additional Comments: Severe aortic stenosis.  LEFT VENTRICLE PLAX 2D LVIDd:         5.10 cm   Diastology LVIDs:         3.10 cm   LV e' medial:    3.15 cm/s LV PW:         1.20 cm   LV E/e' medial:  44.8 LV IVS:        1.00 cm   LV e' lateral:   5.00 cm/s LVOT diam:     2.10 cm   LV E/e' lateral: 28.2 LV SV:         61 LV SV Index:   31 LVOT Area:     3.46 cm  RIGHT VENTRICLE RV Basal diam:  3.30 cm RV Mid diam:    2.00 cm RV S prime:     7.50 cm/s TAPSE (M-mode): 1.5 cm LEFT ATRIUM              Index        RIGHT ATRIUM           Index LA diam:        5.00 cm  2.49 cm/m   RA Area:     12.30 cm LA Vol (A2C):   119.0 ml 59.30 ml/m  RA Volume:   27.00 ml  13.45 ml/m LA Vol (A4C):   65.6 ml  32.69 ml/m LA Biplane Vol: 88.6 ml  44.15 ml/m  AORTIC VALVE AV Area (Vmax):    0.59 cm AV Area (Vmean):   0.53 cm AV Area (VTI):     0.54 cm AV Vmax:           424.50 cm/s AV Vmean:          313.500 cm/s AV VTI:            1.135 m AV Peak Grad:      72.1 mmHg AV Mean Grad:      43.5 mmHg LVOT Vmax:         71.80 cm/s LVOT Vmean:        48.000 cm/s LVOT VTI:          0.177 m LVOT/AV VTI ratio: 0.16 AI PHT:            333 msec  AORTA Ao Root diam: 3.30 cm Ao Asc diam:  3.20 cm MITRAL VALVE                TRICUSPID VALVE MV Area (PHT): 2.07 cm     TR Peak grad:   26.4 mmHg MV Decel Time: 367 msec     TR Vmax:        257.00 cm/s MV E velocity: 141.00 cm/s                             SHUNTS                             Systemic VTI:  0.18 m  Systemic Diam: 2.10 cm Donato Schultz MD Electronically signed by Donato Schultz MD Signature Date/Time: 03/13/2023/11:05:24 AM    Final    DG Abd 1 View  Result Date: 03/13/2023 CLINICAL DATA:  Confirm gastric tube position EXAM: ABDOMEN - 1 VIEW COMPARISON:  Chest x-ray 03/12/2023 FINDINGS: Gastric tube in  place. The tip overlies the body of the stomach. Probable bilateral layering pleural effusions and associated bibasilar atelectasis. No evidence of bowel obstruction. IMPRESSION: Well-positioned gastric tube with the tip overlying the gastric body. Electronically Signed   By: Malachy Moan M.D.   On: 03/13/2023 06:21   DG CHEST PORT 1 VIEW  Result Date: 03/12/2023 CLINICAL DATA:  Intubated, respiratory distress EXAM: PORTABLE CHEST 1 VIEW COMPARISON:  03/10/2017 FINDINGS: Single frontal view of the chest demonstrates endotracheal tube overlying tracheal air column, tip at level of thoracic inlet. The cardiac silhouette is unremarkable. There is diffuse interstitial prominence, with bilateral perihilar airspace disease and trace bilateral pleural effusions. No pneumothorax. No acute bony abnormalities. IMPRESSION: 1. Constellation of findings most consistent with congestive heart failure and pulmonary edema. Underlying infection cannot be excluded. 2. No complications after intubation. Electronically Signed   By: Sharlet Salina M.D.   On: 03/12/2023 23:10    Microbiology: Results for orders placed or performed during the hospital encounter of 03/12/23  SARS Coronavirus 2 by RT PCR (hospital order, performed in [facility] hospital lab) *cepheid single result test* Anterior Nasal Swab     Status: None   Collection Time: 03/12/23 10:20 PM   Specimen: Anterior Nasal Swab  Result Value Ref Range Status   SARS Coronavirus 2 by RT PCR NEGATIVE NEGATIVE Final    Comment: (NOTE) SARS-CoV-2 target nucleic acids are NOT DETECTED.  The SARS-CoV-2 RNA is generally detectable in upper and lower respiratory specimens during the acute phase of infection. The lowest concentration of SARS-CoV-2 viral copies this assay can detect is 250 copies / mL. A negative result does not preclude SARS-CoV-2 infection and should not be used as the sole basis for treatment or other patient management decisions.  A negative  result may occur with improper specimen collection / handling, submission of specimen other than nasopharyngeal swab, presence of viral mutation(s) within the areas targeted by this assay, and inadequate number of viral copies (<250 copies / mL). A negative result must be combined with clinical observations, patient history, and epidemiological information.  Fact Sheet for Patients:   RoadLapTop.co.za  Fact Sheet for Healthcare Providers: http://kim-miller.com/  This test is not yet approved or  cleared by the [place] FDA and has been authorized for detection and/or diagnosis of SARS-CoV-2 by FDA under an Emergency Use Authorization (EUA).  This EUA will remain in effect (meaning this test can be used) for the duration of the COVID-19 declaration under Section 564(b)(1) of the Act, 21 U.S.C. section 360bbb-3(b)(1), unless the authorization is terminated or revoked sooner.  Performed at [facility], [address], [place], [place] [address]   Culture, blood (routine x 2)     Status: Abnormal   Collection Time: 03/13/23  1:25 AM   Specimen: BLOOD  Result Value Ref Range Status   Specimen Description   Final    BLOOD BLOOD LEFT ARM Performed at [facility], [address], [place], [place] [address]    Special Requests   Final    Blood Culture adequate volume BOTTLES DRAWN AEROBIC AND ANAEROBIC Performed at [facility], [address] [place],  [place] [address]    Culture  Setup Time   Final    GRAM POSITIVE COCCI ANAEROBIC BOTTLE ONLY CRITICAL RESULT CALLED TO, READ BACK BY AND VERIFIED WITH: PHARMD ANH PHAM [phone] [id number] BY J RAZZAK, MT    Culture (A)  Final    STAPHYLOCOCCUS CAPITIS THE SIGNIFICANCE OF ISOLATING THIS ORGANISM FROM A SINGLE SET OF BLOOD CULTURES WHEN MULTIPLE SETS ARE DRAWN IS UNCERTAIN. PLEASE NOTIFY THE MICROBIOLOGY DEPARTMENT WITHIN ONE WEEK IF  SPECIATION AND SENSITIVITIES ARE REQUIRED. Performed at [facility], [address] [place], [place] [address]    Report Status 03/16/2023 FINAL  Final  Blood Culture ID Panel (Reflexed)     Status: Abnormal   Collection Time: 03/13/23  1:25 AM  Result Value Ref Range Status   Enterococcus faecalis NOT DETECTED NOT DETECTED Final   Enterococcus Faecium NOT DETECTED NOT DETECTED Final   Listeria monocytogenes NOT DETECTED NOT DETECTED Final   Staphylococcus species DETECTED (A) NOT DETECTED Final    Comment: CRITICAL RESULT CALLED TO, READ BACK BY AND VERIFIED WITH: PHARMD ANH PHAM [phone] [id number] BY J RAZZAK, MT    Staphylococcus aureus (BCID) NOT DETECTED NOT DETECTED Final   Staphylococcus epidermidis NOT DETECTED NOT DETECTED Final   Staphylococcus lugdunensis NOT DETECTED NOT DETECTED Final   Streptococcus species NOT DETECTED NOT DETECTED Final   Streptococcus agalactiae NOT DETECTED NOT DETECTED Final   Streptococcus pneumoniae NOT DETECTED NOT DETECTED Final   Streptococcus pyogenes NOT DETECTED NOT DETECTED Final   A.calcoaceticus-baumannii NOT DETECTED NOT DETECTED Final   Bacteroides fragilis NOT DETECTED NOT DETECTED Final   Enterobacterales NOT DETECTED NOT DETECTED Final   Enterobacter cloacae complex NOT DETECTED NOT DETECTED Final   Escherichia coli NOT DETECTED NOT DETECTED Final   Klebsiella aerogenes NOT DETECTED NOT DETECTED Final   Klebsiella oxytoca NOT DETECTED NOT DETECTED Final   Klebsiella pneumoniae NOT DETECTED NOT DETECTED Final   Proteus species NOT DETECTED NOT DETECTED Final   Salmonella species NOT DETECTED NOT DETECTED Final   Serratia marcescens NOT DETECTED NOT DETECTED Final   Haemophilus influenzae NOT DETECTED NOT DETECTED Final   Neisseria meningitidis NOT DETECTED NOT DETECTED Final   Pseudomonas aeruginosa NOT DETECTED NOT DETECTED Final   Stenotrophomonas maltophilia NOT DETECTED NOT DETECTED Final   Candida albicans NOT DETECTED NOT  DETECTED Final   Candida auris NOT DETECTED NOT DETECTED Final   Candida glabrata NOT DETECTED NOT DETECTED Final   Candida krusei NOT DETECTED NOT DETECTED Final   Candida parapsilosis NOT DETECTED NOT DETECTED Final   Candida tropicalis NOT DETECTED NOT DETECTED Final   Cryptococcus neoformans/gattii NOT DETECTED NOT DETECTED Final    Comment: Performed at [facility], [address] [place], [place] [address]  Culture, blood (routine x 2)     Status: None   Collection Time: 03/13/23  4:59 AM   Specimen: Site Not Specified; Blood  Result Value Ref Range Status   Specimen Description   Final    SITE NOT SPECIFIED Performed at [facility], [address] [place], [place] [address]    Special Requests   Final    BOTTLES DRAWN AEROBIC AND ANAEROBIC Blood Culture adequate volume Performed at [facility], [address] [place], [place] [address]    Culture   Final    NO GROWTH 5 DAYS Performed at [facility], [address], [place], [place] [address]    Report Status 03/18/2023 FINAL  Final    Labs: CBC: Recent Labs  Lab 03/12/23 2200 03/13/23 0304 03/15/23 0710 03/15/23 1352 03/15/23 1930 03/16/23 0527 03/17/23 0426 03/18/23 0453 03/19/23 0905  WBC 11.6*   < > 7.3  --   --  12.8* 10.4 8.4 16.9*  NEUTROABS 6.2  --   --   --   --   --   --   --   --   HGB 18.5*   < > 15.5   < > 16.0 15.0 14.6 15.2 15.8  HCT 53.5*   < > 45.4   < > 46.7 44.0 43.0 44.4 46.7  MCV 106.2*   < > 108.6*  --   --  108.1* 107.8* 107.5* 108.4*  PLT 269   < > 145*  --   --  144* 158 205 219   < > = values in this interval not displayed.   Basic Metabolic Panel: Recent Labs  Lab 03/15/23 0537 03/15/23 1659 03/16/23 0527 03/17/23 0426 03/18/23 0453 03/18/23 1634 03/19/23 0905  NA 130*  --  133* 136 135  --  141  K 3.8  --  3.5 3.7 3.5  --  3.0*  CL 102  --  96* 97* 94*  --  100  CO2 22  --  25 25 25   --  29  GLUCOSE 135*  --  176*  211* 287*  --  133*  BUN 13  --  21 32* 33*  --  30*  CREATININE 0.70  --  0.78 0.90 0.86  --  0.76  CALCIUM 8.3*  --  8.5* 8.8* 8.5*  --  8.8*  MG 2.1 2.7*  --  2.4 2.7* 2.6* 2.5*  PHOS 3.3 2.9  --  4.9* 3.2 2.8 3.0   Liver Function Tests: Recent Labs  Lab 03/12/23 2200 03/14/23 1037  AST 68* 51*  ALT 61* 40  ALKPHOS 85 57  BILITOT 1.5* 1.5*  PROT 9.1* 6.3*  ALBUMIN 4.6 3.2*   CBG: Recent Labs  Lab 03/18/23 2136 03/18/23 2357 03/19/23 0402 03/19/23 0818 03/19/23 1204  GLUCAP 144* 124* 116* 132* 119*    Discharge time spent: 36 minutes.   Signed: Kathlen Mody, MD [facility] 03/19/2023

## 2023-03-20 DIAGNOSIS — J9601 Acute respiratory failure with hypoxia: Secondary | ICD-10-CM | POA: Diagnosis not present

## 2023-03-20 LAB — CBC
HCT: 47.3 % (ref 39.0–52.0)
Hemoglobin: 16 g/dL (ref 13.0–17.0)
MCH: 35.8 pg — ABNORMAL HIGH (ref 26.0–34.0)
MCHC: 33.8 g/dL (ref 30.0–36.0)
MCV: 105.8 fL — ABNORMAL HIGH (ref 80.0–100.0)
Platelets: 196 10*3/uL (ref 150–400)
RBC: 4.47 MIL/uL (ref 4.22–5.81)
RDW: 12.6 % (ref 11.5–15.5)
WBC: 12.8 10*3/uL — ABNORMAL HIGH (ref 4.0–10.5)
nRBC: 0 % (ref 0.0–0.2)

## 2023-03-20 LAB — BASIC METABOLIC PANEL
Anion gap: 13 (ref 5–15)
BUN: 22 mg/dL (ref 8–23)
CO2: 29 mmol/L (ref 22–32)
Calcium: 8.6 mg/dL — ABNORMAL LOW (ref 8.9–10.3)
Chloride: 99 mmol/L (ref 98–111)
Creatinine, Ser: 0.66 mg/dL (ref 0.61–1.24)
GFR, Estimated: >60 mL/min (ref >60)
Glucose, Bld: 129 mg/dL — ABNORMAL HIGH (ref 70–99)
Potassium: 2.8 mmol/L — ABNORMAL LOW (ref 3.5–5.1)
Sodium: 141 mmol/L (ref 135–145)

## 2023-03-20 LAB — PHOSPHORUS: Phosphorus: 2.8 mg/dL (ref 2.5–4.6)

## 2023-03-20 LAB — MAGNESIUM: Magnesium: 2.6 mg/dL — ABNORMAL HIGH (ref 1.7–2.4)

## 2023-03-20 LAB — GLUCOSE, CAPILLARY
Glucose-Capillary: 118 mg/dL — ABNORMAL HIGH (ref 70–99)
Glucose-Capillary: 136 mg/dL — ABNORMAL HIGH (ref 70–99)
Glucose-Capillary: 145 mg/dL — ABNORMAL HIGH (ref 70–99)
Glucose-Capillary: 149 mg/dL — ABNORMAL HIGH (ref 70–99)

## 2023-03-20 MED ORDER — LORAZEPAM 2 MG/ML IJ SOLN
2.0000 mg | Freq: Once | INTRAMUSCULAR | Status: AC
  Administered 2023-03-20: 2 mg via INTRAVENOUS
  Filled 2023-03-20: qty 1

## 2023-03-20 MED ORDER — HEPARIN SOD (PORK) LOCK FLUSH 100 UNIT/ML IV SOLN
250.0000 [IU] | INTRAVENOUS | Status: AC | PRN
  Administered 2023-03-20: 500 [IU]
  Filled 2023-03-20: qty 2.5

## 2023-03-20 MED ORDER — LORAZEPAM 2 MG/ML IJ SOLN
1.0000 mg | INTRAMUSCULAR | Status: DC | PRN
  Administered 2023-03-20: 2 mg via INTRAVENOUS
  Filled 2023-03-20: qty 1

## 2023-03-20 NOTE — Progress Notes (Signed)
Daily Progress Note   Patient Name: Scott Newman       Date: 03/20/2023 DOB: [birth year]  Age: 77 y.o. MRN#: [id number] Attending Physician: Scott Mody, MD Primary Care Physician: Scott Sam, MD Admit Date: 03/12/2023  Reason for Consultation/Follow-up: Terminal Care  Subjective:  Not awake not alert Restless and mild agitation evident.   Length of Stay: 7  Current Medications: Scheduled Meds:    Continuous Infusions:   PRN Meds: LORazepam  Physical Exam         Unresponsive but with non verbal gestures of restlessness and mild agitation.  Shallow breath sounds Abdomen distended  Vital Signs: BP (!) 179/83 (BP Location: Left Arm)   Pulse (!) 102   Temp (!) 97.5 F (36.4 C) (Oral)   Resp 16   Ht 5\' 8"  (1.727 m)   Wt 84 kg   SpO2 94%   BMI 28.16 kg/m  SpO2: SpO2: 94 % O2 Device: O2 Device: Nasal Cannula O2 Flow Rate: O2 Flow Rate (L/min): 4 L/min  Intake/output summary:  Intake/Output Summary (Last 24 hours) at 03/20/2023 0855 Last data filed at 03/20/2023 0300 Gross per 24 hour  Intake 0 ml  Output 4000 ml  Net -4000 ml   LBM: Last BM Date : 03/19/23 Baseline Weight: Weight: 86.2 kg Most recent weight: Weight: 84 kg       Palliative Assessment/Data:      Patient Active Problem List   Diagnosis Date Noted   Acute hypoxemic respiratory failure (HCC) 03/13/2023    Palliative Care Assessment & Plan   Patient Profile:    Assessment:   77 year old gentleman, history of former smoking, at home with his wife, history of esophageal stricture.  Patient was scheduled for EGD with [name].  Patient eats saltine crackers and was noted to have had an event of aspiration and got choked up in the evening of 03-12-2023 while eating  crackers Patient was brought into the emergency department, at that time his oxygen saturations were in the 70s, chest x-ray showed bilateral infiltrates. He actually required intubation in the emergency department and was initially managed by pulmonary critical care medicine in the ICU.  On 03-12-2023 he was intubated and started on antibiotics.  He was found to have elevated troponins for which heparin and Levophed drips were utilized.  On 9-3 he was extubated however he has had ongoing delirium and agitation limiting his progression.  Hospital course also complicated by A-fib with RVR. NG tube was placed and a trial of tube feeds was being done. However patient pulled out NG tube. Patient's wife had discussions with the the medical team and elected for DNR-comfort.  She wishes to avoid any further procedures or tests and wishes to focus on comfort-focused care.  Recommendations/Plan:  Ativan IV PRN for agitation.  Continue comfort care.  Transfer to residential hospice.   Goals of Care and Additional Recommendations: Limitations on Scope of Treatment: Full Comfort Care  Code Status:    Code Status Orders  (From admission, onward)           Start     Ordered   03/18/23 1401  Do not attempt resuscitation (DNR) - Comfort care  (Code Status)  Continuous       Question Answer Comment  If patient has no pulse and is not breathing Do Not Attempt Resuscitation   In Pre-Arrest Conditions (Patient Is Breathing and Has a Pulse) Provide comfort measures. Relieve any mechanical airway obstruction. Avoid transfer unless required for comfort.   Consent: Discussion documented in EHR or advanced directives reviewed      03/18/23 1400           Code Status History     Date Active Date Inactive Code Status Order ID Comments User Context   03/13/2023 0305 03/18/2023 1400 Full Code [id number]  Scott Igo, DO ED       Prognosis:  < 2 weeks  Discharge Planning: Hospice facility  Care  plan was discussed with  patient's wife Scott Newman on the phone.   Thank you for allowing the Palliative Medicine Team to assist in the care of this patient.  Mod MDM.      Greater than 50%  of this time was spent counseling and coordinating care related to the above assessment and plan.  Scott Hawking, MD  Please contact Palliative Medicine Team phone at [phone] for questions and concerns.

## 2023-03-20 NOTE — Progress Notes (Signed)
Pt has a DL PICC in the RUA; PICC flushed w 10cc NS in each port and followed by 250 units heparin to each port;  good blood returns noted.

## 2023-03-20 NOTE — Plan of Care (Signed)
  Problem: Safety: Goal: Ability to remain free from injury will improve Outcome: Progressing   Problem: Skin Integrity: Goal: Risk for impaired skin integrity will decrease Outcome: Progressing   Problem: Coping: Goal: Ability to adjust to condition or change in health will improve Outcome: Progressing

## 2023-03-20 NOTE — TOC Transition Note (Signed)
Transition of Care [facility]) - CM/SW Discharge Note   Patient Details  Name: Scott Newman MRN: [id number] Date of Birth: May 07, 1946  Transition of Care [facility] CM/SW Contact:  Georgie Chard, LCSW Phone Number: 03/20/2023, 9:36 AM   Clinical Narrative:    Patient will DC to [facility]. At this time TOC will help arrange PTAR.      Barriers to Discharge: Continued Medical Work up   Patient Goals and CMS Choice CMS Medicare.gov Compare Post Acute Care list provided to:: Patient Represenative (must comment) Choice offered to / list presented to : Adult Children  Discharge Placement                         Discharge Plan and Services Additional resources added to the After Visit Summary for   In-house Referral: Hospice / Palliative Care Discharge Planning Services: CM Consult Post Acute Care Choice: NA          DME Arranged: N/A DME Agency: NA       HH Arranged: NA HH Agency: NA        Social Determinants of Health (SDOH) Interventions SDOH Screenings   Food Insecurity: Patient Unable To Answer (03/13/2023)  Housing: Patient Unable To Answer (03/13/2023)  Transportation Needs: Patient Unable To Answer (03/13/2023)  Utilities: Patient Unable To Answer (03/13/2023)  Tobacco Use: Medium Risk (03/19/2023)     Readmission Risk Interventions    03/18/2023    6:08 PM  Readmission Risk Prevention Plan  Post Dischage Appt Complete  Medication Screening Complete  Transportation Screening Complete

## 2023-03-20 NOTE — Plan of Care (Signed)
  Problem: Education: Goal: Knowledge of General Education information will improve Description: Including pain rating scale, medication(s)/side effects and non-pharmacologic comfort measures Outcome: Progressing   Problem: Health Behavior/Discharge Planning: Goal: Ability to manage health-related needs will improve Outcome: Progressing   Problem: Clinical Measurements: Goal: Will remain free from infection Outcome: Progressing   Problem: Elimination: Goal: Will not experience complications related to urinary retention Outcome: Progressing   Problem: Skin Integrity: Goal: Risk for impaired skin integrity will decrease Outcome: Progressing

## 2023-03-21 ENCOUNTER — Encounter: Payer: Self-pay | Admitting: Internal Medicine

## 2023-03-21 ENCOUNTER — Encounter: Payer: Self-pay | Admitting: Hematology

## 2023-03-23 ENCOUNTER — Telehealth: Payer: Self-pay | Admitting: Family Medicine

## 2023-03-25 ENCOUNTER — Telehealth: Payer: Self-pay

## 2023-03-25 ENCOUNTER — Telehealth: Payer: Self-pay | Admitting: Cardiology

## 2023-03-25 NOTE — Telephone Encounter (Signed)
Spoke with patient's wife concerning this. Was informed that he passed away on 2023-04-21. She is okay and at peace with his death. She wants to thank you for all that you done for him. She wrote a letter and sent it to you through my chart but said she didn't get confirmation that it went through (I didn't find it). She said she may try to send it again later.

## 2023-03-25 NOTE — Telephone Encounter (Signed)
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

## 2023-03-25 NOTE — Telephone Encounter (Signed)
Pt's spouse [name] called stating pt passed away on 07-Apr-2023 but she'd like a callback regarding his last ultrasound that was done in June on the 27th. She stated she may be in and out so call the [phone] number. She'd like to know which Heart Valve was defected. She also wants Dr. Antoine Poche to know that [facility] just loved him and they appreciate everything. Please advise

## 2023-03-25 NOTE — Telephone Encounter (Signed)
Entered in error

## 2023-03-28 NOTE — Telephone Encounter (Signed)
Patient's wife declined appointment. She said she is okay.

## 2023-04-12 NOTE — Telephone Encounter (Signed)
Pts wife would like a call back concerning pts health. Pt is deteriorating and wife would like a call back from Dr Jon Billings. Please advise.

## 2023-04-12 DEATH — deceased

## 2023-05-09 ENCOUNTER — Telehealth: Payer: Self-pay | Admitting: Cardiology

## 2023-05-09 NOTE — Telephone Encounter (Signed)
Pt wife wrote a letter asking about her husbands condition before he passed away and what exactly caused his death.  She left her phone number on the letter.  It will be in the mailbox

## 2023-05-09 NOTE — Telephone Encounter (Signed)
Message routed to MD/RN.

## 2023-05-10 NOTE — Telephone Encounter (Signed)
Left message for pt wife, letter received and dr hochrein will be able to read later this week when he is in the office.

## 2023-05-17 ENCOUNTER — Ambulatory Visit: Payer: PPO | Admitting: Cardiology

## 2023-06-16 ENCOUNTER — Ambulatory Visit: Payer: PPO | Admitting: Internal Medicine
